# Patient Record
Sex: Female | Born: 1935 | Race: White | Hispanic: No | Smoking: Never smoker
Health system: Southern US, Community
[De-identification: ages and names within clinical notes are randomized; demographics above are authoritative.]

## PROBLEM LIST (undated history)

## (undated) DIAGNOSIS — I10 Essential (primary) hypertension: Secondary | ICD-10-CM

## (undated) DIAGNOSIS — E119 Type 2 diabetes mellitus without complications: Secondary | ICD-10-CM

## (undated) DIAGNOSIS — I4891 Unspecified atrial fibrillation: Secondary | ICD-10-CM

## (undated) HISTORY — PX: ABDOMINAL HYSTERECTOMY: SHX81

---

## 2004-07-14 ENCOUNTER — Other Ambulatory Visit: Payer: Self-pay

## 2004-07-14 ENCOUNTER — Emergency Department: Payer: Self-pay | Admitting: Emergency Medicine

## 2005-05-02 ENCOUNTER — Emergency Department: Payer: Self-pay | Admitting: Emergency Medicine

## 2005-11-25 ENCOUNTER — Ambulatory Visit: Payer: Self-pay | Admitting: Gastroenterology

## 2007-01-14 ENCOUNTER — Ambulatory Visit: Payer: Self-pay | Admitting: Internal Medicine

## 2007-02-21 ENCOUNTER — Ambulatory Visit: Payer: Self-pay | Admitting: Internal Medicine

## 2007-04-25 ENCOUNTER — Ambulatory Visit: Payer: Self-pay | Admitting: Internal Medicine

## 2007-12-04 ENCOUNTER — Emergency Department: Payer: Self-pay | Admitting: Emergency Medicine

## 2008-01-18 ENCOUNTER — Ambulatory Visit: Payer: Self-pay | Admitting: Anesthesiology

## 2008-04-02 ENCOUNTER — Ambulatory Visit: Payer: Self-pay | Admitting: Anesthesiology

## 2008-04-24 ENCOUNTER — Ambulatory Visit: Payer: Self-pay | Admitting: Anesthesiology

## 2008-07-18 ENCOUNTER — Ambulatory Visit: Payer: Self-pay | Admitting: Anesthesiology

## 2009-03-11 ENCOUNTER — Encounter: Payer: Self-pay | Admitting: Internal Medicine

## 2009-03-24 ENCOUNTER — Encounter: Payer: Self-pay | Admitting: Internal Medicine

## 2010-06-17 ENCOUNTER — Ambulatory Visit: Payer: Self-pay | Admitting: Internal Medicine

## 2010-06-24 ENCOUNTER — Ambulatory Visit: Payer: Self-pay | Admitting: Internal Medicine

## 2010-08-13 ENCOUNTER — Ambulatory Visit: Payer: Self-pay | Admitting: Internal Medicine

## 2010-08-23 ENCOUNTER — Ambulatory Visit: Payer: Self-pay | Admitting: Internal Medicine

## 2010-09-22 ENCOUNTER — Ambulatory Visit: Payer: Self-pay | Admitting: Internal Medicine

## 2011-06-07 ENCOUNTER — Inpatient Hospital Stay: Payer: Self-pay | Admitting: Internal Medicine

## 2011-06-07 LAB — CK TOTAL AND CKMB (NOT AT ARMC): CK, Total: 55 U/L (ref 21–215)

## 2011-06-07 LAB — CBC
MCH: 29.3 pg (ref 26.0–34.0)
MCHC: 33.1 g/dL (ref 32.0–36.0)
MCV: 89 fL (ref 80–100)
Platelet: 208 10*3/uL (ref 150–440)
RDW: 13.9 % (ref 11.5–14.5)
WBC: 16.1 10*3/uL — ABNORMAL HIGH (ref 3.6–11.0)

## 2011-06-07 LAB — COMPREHENSIVE METABOLIC PANEL
Alkaline Phosphatase: 69 U/L (ref 50–136)
Anion Gap: 12 (ref 7–16)
Bilirubin,Total: 0.5 mg/dL (ref 0.2–1.0)
Calcium, Total: 8.3 mg/dL — ABNORMAL LOW (ref 8.5–10.1)
Chloride: 103 mmol/L (ref 98–107)
Co2: 24 mmol/L (ref 21–32)
EGFR (African American): >60
EGFR (Non-African Amer.): >60
Potassium: 3.9 mmol/L (ref 3.5–5.1)
SGOT(AST): 13 U/L — ABNORMAL LOW (ref 15–37)
SGPT (ALT): 16 U/L

## 2011-06-07 LAB — TROPONIN I: Troponin-I: <0.02 ng/mL

## 2011-06-08 LAB — CBC WITH DIFFERENTIAL/PLATELET
Basophil #: 0 10*3/uL (ref 0.0–0.1)
Basophil %: 0 %
Eosinophil #: 0 10*3/uL (ref 0.0–0.7)
HCT: 35.8 % (ref 35.0–47.0)
HGB: 11.8 g/dL — ABNORMAL LOW (ref 12.0–16.0)
Lymphocyte %: 5.4 %
MCH: 29.2 pg (ref 26.0–34.0)
MCHC: 32.8 g/dL (ref 32.0–36.0)
MCV: 89 fL (ref 80–100)
Monocyte #: 0.5 10*3/uL (ref 0.0–0.7)
Neutrophil #: 16.7 10*3/uL — ABNORMAL HIGH (ref 1.4–6.5)
Neutrophil %: 92 %
RBC: 4.03 10*6/uL (ref 3.80–5.20)

## 2011-06-08 LAB — BASIC METABOLIC PANEL
Anion Gap: 10 (ref 7–16)
Chloride: 103 mmol/L (ref 98–107)
Co2: 25 mmol/L (ref 21–32)
Creatinine: 1.04 mg/dL (ref 0.60–1.30)
EGFR (African American): >60
Osmolality: 290 (ref 275–301)

## 2011-06-08 LAB — URINALYSIS, COMPLETE
Bacteria: NONE SEEN
Glucose,UR: >=500 mg/dL (ref 0–75)
Ph: 5 (ref 4.5–8.0)
Protein: 30
RBC,UR: 3 /HPF (ref 0–5)
Specific Gravity: 1.017 (ref 1.003–1.030)
Squamous Epithelial: NONE SEEN

## 2011-06-08 LAB — TROPONIN I: Troponin-I: <0.02 ng/mL

## 2011-06-08 LAB — CK TOTAL AND CKMB (NOT AT ARMC): CK, Total: 87 U/L (ref 21–215)

## 2011-06-09 LAB — CBC WITH DIFFERENTIAL/PLATELET
Basophil #: 0 10*3/uL (ref 0.0–0.1)
Basophil %: 0 %
Eosinophil %: 0 %
HCT: 33.5 % — ABNORMAL LOW (ref 35.0–47.0)
HGB: 11 g/dL — ABNORMAL LOW (ref 12.0–16.0)
Lymphocyte #: 1 10*3/uL (ref 1.0–3.6)
Lymphocyte %: 7.9 %
MCHC: 32.9 g/dL (ref 32.0–36.0)
Monocyte #: 0.3 10*3/uL (ref 0.0–0.7)
Monocyte %: 2.7 %
Neutrophil #: 11.6 10*3/uL — ABNORMAL HIGH (ref 1.4–6.5)
Neutrophil %: 89.4 %
Platelet: 228 10*3/uL (ref 150–440)
RBC: 3.78 10*6/uL — ABNORMAL LOW (ref 3.80–5.20)
WBC: 13 10*3/uL — ABNORMAL HIGH (ref 3.6–11.0)

## 2011-06-09 LAB — BASIC METABOLIC PANEL
Calcium, Total: 8.1 mg/dL — ABNORMAL LOW (ref 8.5–10.1)
Chloride: 104 mmol/L (ref 98–107)
Co2: 24 mmol/L (ref 21–32)
Creatinine: 1.32 mg/dL — ABNORMAL HIGH (ref 0.60–1.30)
EGFR (African American): 51 — ABNORMAL LOW
Potassium: 4 mmol/L (ref 3.5–5.1)
Sodium: 141 mmol/L (ref 136–145)

## 2011-06-10 LAB — BASIC METABOLIC PANEL
Anion Gap: 10 (ref 7–16)
BUN: 47 mg/dL — ABNORMAL HIGH (ref 7–18)
Calcium, Total: 8.8 mg/dL (ref 8.5–10.1)
Co2: 25 mmol/L (ref 21–32)
Creatinine: 1.44 mg/dL — ABNORMAL HIGH (ref 0.60–1.30)
EGFR (African American): 46 — ABNORMAL LOW
Glucose: 281 mg/dL — ABNORMAL HIGH (ref 65–99)
Potassium: 4 mmol/L (ref 3.5–5.1)

## 2011-06-10 LAB — CBC WITH DIFFERENTIAL/PLATELET
Eosinophil #: 0 10*3/uL (ref 0.0–0.7)
Eosinophil %: 0 %
HGB: 12 g/dL (ref 12.0–16.0)
Lymphocyte %: 11.9 %
MCH: 29.3 pg (ref 26.0–34.0)
MCHC: 32.7 g/dL (ref 32.0–36.0)
Monocyte %: 3.1 %
Neutrophil %: 84.9 %
Platelet: 265 10*3/uL (ref 150–440)
RBC: 4.08 10*6/uL (ref 3.80–5.20)

## 2011-06-13 LAB — CULTURE, BLOOD (SINGLE)

## 2011-09-09 ENCOUNTER — Ambulatory Visit: Payer: Self-pay | Admitting: Internal Medicine

## 2012-07-13 ENCOUNTER — Ambulatory Visit: Payer: Self-pay | Admitting: Internal Medicine

## 2013-07-03 LAB — CBC
HCT: 40.2 % (ref 35.0–47.0)
HGB: 13.4 g/dL (ref 12.0–16.0)
MCH: 30.2 pg (ref 26.0–34.0)
MCHC: 33.3 g/dL (ref 32.0–36.0)
MCV: 91 fL (ref 80–100)
Platelet: 210 10*3/uL (ref 150–440)
RBC: 4.43 10*6/uL (ref 3.80–5.20)
RDW: 13.6 % (ref 11.5–14.5)
WBC: 7.5 10*3/uL (ref 3.6–11.0)

## 2013-07-03 LAB — COMPREHENSIVE METABOLIC PANEL
Albumin: 2.4 g/dL — ABNORMAL LOW (ref 3.4–5.0)
Alkaline Phosphatase: 84 U/L
Anion Gap: 11 (ref 7–16)
BUN: 26 mg/dL — ABNORMAL HIGH (ref 7–18)
Bilirubin,Total: 0.4 mg/dL (ref 0.2–1.0)
CREATININE: 1.28 mg/dL (ref 0.60–1.30)
Calcium, Total: 8.7 mg/dL (ref 8.5–10.1)
Chloride: 100 mmol/L (ref 98–107)
Co2: 22 mmol/L (ref 21–32)
GFR CALC AF AMER: 47 — AB
GFR CALC NON AF AMER: 41 — AB
GLUCOSE: 463 mg/dL — AB (ref 65–99)
Osmolality: 291 (ref 275–301)
POTASSIUM: 4 mmol/L (ref 3.5–5.1)
SGOT(AST): 8 U/L — ABNORMAL LOW (ref 15–37)
SGPT (ALT): 15 U/L (ref 12–78)
Sodium: 133 mmol/L — ABNORMAL LOW (ref 136–145)
Total Protein: 7.8 g/dL (ref 6.4–8.2)

## 2013-07-03 LAB — URINALYSIS, COMPLETE
Bilirubin,UR: NEGATIVE
Glucose,UR: >=500 mg/dL (ref 0–75)
Nitrite: NEGATIVE
Ph: 5 (ref 4.5–8.0)
Protein: NEGATIVE
SPECIFIC GRAVITY: 1.016 (ref 1.003–1.030)
Squamous Epithelial: 1

## 2013-07-03 LAB — LIPASE, BLOOD: Lipase: 155 U/L (ref 73–393)

## 2013-07-03 LAB — TROPONIN I: Troponin-I: <0.02 ng/mL

## 2013-07-04 ENCOUNTER — Inpatient Hospital Stay: Payer: Self-pay | Admitting: Internal Medicine

## 2013-07-04 LAB — CK TOTAL AND CKMB (NOT AT ARMC)
CK, TOTAL: 13 U/L — AB
CK, Total: 13 U/L — ABNORMAL LOW
CK-MB: <0.5 ng/mL — ABNORMAL LOW (ref 0.5–3.6)

## 2013-07-04 LAB — TROPONIN I: Troponin-I: <0.02 ng/mL

## 2013-07-05 LAB — BASIC METABOLIC PANEL
Anion Gap: 7 (ref 7–16)
BUN: 15 mg/dL (ref 7–18)
CALCIUM: 8.2 mg/dL — AB (ref 8.5–10.1)
Chloride: 110 mmol/L — ABNORMAL HIGH (ref 98–107)
Co2: 23 mmol/L (ref 21–32)
Creatinine: 1.08 mg/dL (ref 0.60–1.30)
EGFR (African American): 58 — ABNORMAL LOW
EGFR (Non-African Amer.): 50 — ABNORMAL LOW
GLUCOSE: 221 mg/dL — AB (ref 65–99)
OSMOLALITY: 287 (ref 275–301)
POTASSIUM: 4.1 mmol/L (ref 3.5–5.1)
SODIUM: 140 mmol/L (ref 136–145)

## 2013-07-05 LAB — LIPID PANEL
Cholesterol: 96 mg/dL (ref 0–200)
HDL Cholesterol: 21 mg/dL — ABNORMAL LOW (ref 40–60)
Ldl Cholesterol, Calc: 42 mg/dL (ref 0–100)
TRIGLYCERIDES: 165 mg/dL (ref 0–200)
VLDL Cholesterol, Calc: 33 mg/dL (ref 5–40)

## 2013-07-05 LAB — CBC WITH DIFFERENTIAL/PLATELET
BASOS PCT: 0.2 %
Basophil #: 0 10*3/uL (ref 0.0–0.1)
EOS PCT: 0.3 %
Eosinophil #: 0 10*3/uL (ref 0.0–0.7)
HCT: 36.4 % (ref 35.0–47.0)
HGB: 12.3 g/dL (ref 12.0–16.0)
Lymphocyte #: 1.4 10*3/uL (ref 1.0–3.6)
Lymphocyte %: 18.4 %
MCH: 30.8 pg (ref 26.0–34.0)
MCHC: 33.8 g/dL (ref 32.0–36.0)
MCV: 91 fL (ref 80–100)
Monocyte #: 0.7 x10 3/mm (ref 0.2–0.9)
Monocyte %: 8.9 %
Neutrophil #: 5.5 10*3/uL (ref 1.4–6.5)
Neutrophil %: 72.2 %
Platelet: 217 10*3/uL (ref 150–440)
RBC: 4 10*6/uL (ref 3.80–5.20)
RDW: 14.1 % (ref 11.5–14.5)
WBC: 7.6 10*3/uL (ref 3.6–11.0)

## 2013-07-05 LAB — TSH: THYROID STIMULATING HORM: 0.798 u[IU]/mL

## 2013-07-05 LAB — HEMOGLOBIN A1C: Hemoglobin A1C: 12.8 % — ABNORMAL HIGH (ref 4.2–6.3)

## 2013-07-05 LAB — CK TOTAL AND CKMB (NOT AT ARMC)
CK, TOTAL: 10 U/L — AB
CK-MB: <0.5 ng/mL — ABNORMAL LOW (ref 0.5–3.6)

## 2013-07-05 LAB — MAGNESIUM: MAGNESIUM: 1.7 mg/dL — AB

## 2013-07-06 ENCOUNTER — Ambulatory Visit: Payer: Self-pay | Admitting: Internal Medicine

## 2013-07-06 LAB — BASIC METABOLIC PANEL
ANION GAP: 5 — AB (ref 7–16)
BUN: 14 mg/dL (ref 7–18)
CALCIUM: 8.3 mg/dL — AB (ref 8.5–10.1)
Chloride: 113 mmol/L — ABNORMAL HIGH (ref 98–107)
Co2: 20 mmol/L — ABNORMAL LOW (ref 21–32)
Creatinine: 1.05 mg/dL (ref 0.60–1.30)
EGFR (African American): >60
EGFR (Non-African Amer.): 52 — ABNORMAL LOW
GLUCOSE: 167 mg/dL — AB (ref 65–99)
Osmolality: 280 (ref 275–301)
Potassium: 4 mmol/L (ref 3.5–5.1)
Sodium: 138 mmol/L (ref 136–145)

## 2013-07-06 LAB — CBC WITH DIFFERENTIAL/PLATELET
Basophil #: 0 10*3/uL (ref 0.0–0.1)
Basophil %: 0.4 %
Eosinophil #: 0 10*3/uL (ref 0.0–0.7)
Eosinophil %: 0.8 %
HCT: 33.5 % — AB (ref 35.0–47.0)
HGB: 11.3 g/dL — AB (ref 12.0–16.0)
LYMPHS ABS: 1.3 10*3/uL (ref 1.0–3.6)
Lymphocyte %: 22 %
MCH: 30.3 pg (ref 26.0–34.0)
MCHC: 33.8 g/dL (ref 32.0–36.0)
MCV: 90 fL (ref 80–100)
Monocyte #: 0.5 x10 3/mm (ref 0.2–0.9)
Monocyte %: 9.1 %
Neutrophil #: 4.1 10*3/uL (ref 1.4–6.5)
Neutrophil %: 67.7 %
Platelet: 232 10*3/uL (ref 150–440)
RBC: 3.74 10*6/uL — AB (ref 3.80–5.20)
RDW: 14.3 % (ref 11.5–14.5)
WBC: 6 10*3/uL (ref 3.6–11.0)

## 2013-07-07 LAB — BASIC METABOLIC PANEL
ANION GAP: 8 (ref 7–16)
BUN: 13 mg/dL (ref 7–18)
CREATININE: 1.09 mg/dL (ref 0.60–1.30)
Calcium, Total: 8 mg/dL — ABNORMAL LOW (ref 8.5–10.1)
Chloride: 112 mmol/L — ABNORMAL HIGH (ref 98–107)
Co2: 21 mmol/L (ref 21–32)
GFR CALC AF AMER: 58 — AB
GFR CALC NON AF AMER: 50 — AB
Glucose: 137 mg/dL — ABNORMAL HIGH (ref 65–99)
Osmolality: 284 (ref 275–301)
POTASSIUM: 3.8 mmol/L (ref 3.5–5.1)
Sodium: 141 mmol/L (ref 136–145)

## 2013-07-07 LAB — CBC WITH DIFFERENTIAL/PLATELET
Basophil #: 0 10*3/uL (ref 0.0–0.1)
Basophil %: 0.3 %
EOS ABS: 0.1 10*3/uL (ref 0.0–0.7)
EOS PCT: 0.9 %
HCT: 33 % — AB (ref 35.0–47.0)
HGB: 10.9 g/dL — AB (ref 12.0–16.0)
Lymphocyte #: 1.6 10*3/uL (ref 1.0–3.6)
Lymphocyte %: 29 %
MCH: 29.7 pg (ref 26.0–34.0)
MCHC: 33 g/dL (ref 32.0–36.0)
MCV: 90 fL (ref 80–100)
Monocyte #: 0.5 x10 3/mm (ref 0.2–0.9)
Monocyte %: 8.3 %
NEUTROS ABS: 3.5 10*3/uL (ref 1.4–6.5)
NEUTROS PCT: 61.5 %
Platelet: 269 10*3/uL (ref 150–440)
RBC: 3.66 10*6/uL — ABNORMAL LOW (ref 3.80–5.20)
RDW: 14.1 % (ref 11.5–14.5)
WBC: 5.7 10*3/uL (ref 3.6–11.0)

## 2013-07-07 LAB — URINE CULTURE

## 2013-07-10 LAB — BASIC METABOLIC PANEL
Anion Gap: 6 — ABNORMAL LOW (ref 7–16)
BUN: 12 mg/dL (ref 7–18)
CO2: 23 mmol/L (ref 21–32)
CREATININE: 1.18 mg/dL (ref 0.60–1.30)
Calcium, Total: 8.6 mg/dL (ref 8.5–10.1)
Chloride: 113 mmol/L — ABNORMAL HIGH (ref 98–107)
EGFR (African American): 52 — ABNORMAL LOW
GFR CALC NON AF AMER: 45 — AB
Glucose: 149 mg/dL — ABNORMAL HIGH (ref 65–99)
Osmolality: 286 (ref 275–301)
Potassium: 4.5 mmol/L (ref 3.5–5.1)
Sodium: 142 mmol/L (ref 136–145)

## 2013-07-10 LAB — CBC WITH DIFFERENTIAL/PLATELET
BASOS PCT: 0.5 %
Basophil #: 0 10*3/uL (ref 0.0–0.1)
EOS PCT: 1.5 %
Eosinophil #: 0.1 10*3/uL (ref 0.0–0.7)
HCT: 34.1 % — ABNORMAL LOW (ref 35.0–47.0)
HGB: 11.4 g/dL — AB (ref 12.0–16.0)
LYMPHS PCT: 30 %
Lymphocyte #: 1.7 10*3/uL (ref 1.0–3.6)
MCH: 29.9 pg (ref 26.0–34.0)
MCHC: 33.4 g/dL (ref 32.0–36.0)
MCV: 90 fL (ref 80–100)
MONO ABS: 0.4 x10 3/mm (ref 0.2–0.9)
Monocyte %: 7.8 %
Neutrophil #: 3.4 10*3/uL (ref 1.4–6.5)
Neutrophil %: 60.2 %
Platelet: 301 10*3/uL (ref 150–440)
RBC: 3.81 10*6/uL (ref 3.80–5.20)
RDW: 13.8 % (ref 11.5–14.5)
WBC: 5.7 10*3/uL (ref 3.6–11.0)

## 2013-07-12 ENCOUNTER — Ambulatory Visit: Payer: Self-pay | Admitting: Internal Medicine

## 2013-07-12 LAB — APTT: ACTIVATED PTT: 29 s (ref 23.6–35.9)

## 2013-07-12 LAB — PROTIME-INR
INR: 1
Prothrombin Time: 13.4 secs (ref 11.5–14.7)

## 2013-07-18 LAB — PATHOLOGY REPORT

## 2014-09-14 NOTE — Consult Note (Signed)
   Present Illness 79 yo female withi history of copd admitted with weakness and fatigue and note to have a uti. Was also noted to have afib with rvr. Pt has not had cardiac problems in the past and denies ever having irregular heart rate. She was hemodynamically stable with this She was given iv diltiazem with improvment in her rate. She is stable at present. Sh denies chest pain. She has ruled out for an mi thus far.   Physical Exam:  GEN no acute distress   HEENT PERRL, hearing intact to voice   NECK supple   RESP normal resp effort  clear BS   CARD Irregular rate and rhythm  No murmur   ABD denies tenderness  no liver/spleen enlargement  no Abdominal Bruits   LYMPH negative neck   EXTR negative cyanosis/clubbing, negative edema   SKIN normal to palpation   NEURO cranial nerves intact, motor/sensory function intact   PSYCH A+O to time, place, person   Review of Systems:  Subjective/Chief Complaint weakness and fatigue   General: Fatigue  Fever/chills  Weakness   Skin: No Complaints   ENT: No Complaints   Eyes: No Complaints   Neck: No Complaints   Respiratory: No Complaints   Cardiovascular: Palpitations   Gastrointestinal: No Complaints   Genitourinary: No Complaints   Vascular: No Complaints   Musculoskeletal: No Complaints   Neurologic: No Complaints   Hematologic: No Complaints   Endocrine: No Complaints   Psychiatric: No Complaints   Review of Systems: All other systems were reviewed and found to be negative   Medications/Allergies Reviewed Medications/Allergies reviewed   EKG:  Interpretation afib with variable ventricular response    Reglan: Swelling, Rash  Demerol: GI Distress  Darvon: GI Distress  Penicillin: Rash   Impression 79 yo female with copd admitted with uti and noted ot have afib wiht rvr. Has diabetes. No prior cardiac disease. Ruled out for mi thus far. Rate controlled wiht iv dilt. Will wean off of iv dilt and place on  cardizem 30 mg q 6. Treat uti. Echo.CHADSS score is 2-3.   Plan 1. Wean off of iv dilt. 2 Cardizem 30 q 6 3 Reveiw echo when available 4. Will add xarelto when ruled out 5. WIll follow   Electronic Signatures: Dalia HeadingFath, Reeder Brisby A (MD)  (Signed 11-Feb-15 11:08)  Authored: General Aspect/Present Illness, History and Physical Exam, Review of System, EKG , Allergies, Impression/Plan   Last Updated: 11-Feb-15 11:08 by Dalia HeadingFath, Gionni Vaca A (MD)

## 2014-09-14 NOTE — Consult Note (Signed)
PATIENT NAMEDRUSILLA, [name] MR#:  [id number] DATE OF BIRTH:  11/18/1935  ONCOLOGY CONSULTATION   DATE OF CONSULTATION:  07/06/2013  CONSULTING PHYSICIAN:  Knute Neu. Lorre Nick, MD  HISTORY OF PRESENT ILLNESS: Ms. [name] is a 79 year old patient who was admitted on February 10th with a chief complaint of left lower quadrant abdominal pain, nausea and dizziness. The patient was found in rapid atrial fibrillation. She was treated with Cardizem bolus and drip. She was given Rocephin with urine infection and given intravenous fluids. Was given insulin for hyperglycemia. Appeared to have pyelonephritis and had possibly malnutrition. Was hospitalized for intravenous fluids, treatment of chronic obstructive pulmonary disease, antibiotics, rate and rhythm control, cardiology consultation. An x-ray showed a lung mass, and a noncontrast CT showed a peripheral right lower lobe lung mass as well.   PAST MEDICAL HISTORY: Obstructive sleep apnea, uses oxygen at night. COPD, in the past, used oxygen during the daytime, none recently, does report some shortness of breath on exertion. Has had some chronic low back pain, degenerative joint disease, hypertension, type 2 diabetes.   PAST SURGICAL HISTORY: Cataracts and hysterectomy.   ALLERGIES: DARVON, DEMEROL, PENICILLIN AND REGLAN.   SOCIAL HISTORY: No alcohol. No smoking, did smoke 30 or 40 years ago briefly.   FAMILY HISTORY: The patient reported some family members with malignancy, but did not have many details. A brother had prostate cancer. A family member had lung cancer. A niece and a nephew had cancers of unknown type.   MEDICATIONS AT HOME: Included:  1. Metformin 1000 mg twice a day. 2. Lisinopril 20 mg a day.  3. Hydrochlorothiazide 25 daily.  4. Glipizide 10 twice a day. 5. Celebrex 200 mg daily.  6. Albuterol 2 puffs p.r.n., not taken recently.   SYSTEM REVIEW:  CONSTITUTIONAL: When I saw the patient, she did not have any fever, chills, sweats.  Had some general weakness.  HEENT: No visual disturbances. No ear or jaw pain. No epistaxis.  RESPIRATORY: No active cough. Has a history of COPD.  CARDIOVASCULAR: Was not having chest pain or palpitation. Was not dizzy. That was a complaint on admission.  GASTROINTESTINAL: Was not nauseous or vomiting, but had been nauseated on admission. Was not currently having abdominal pain, which was an admission complaint.  GENITOURINARY: No dysuria or hematuria.  HEMATOLOGIC: No easy bleeding or bruising.  SKIN: No rash or pruritus.  NEUROLOGIC: No focal weakness.  PSYCHIATRIC: Denied any depression or anxiety.   PHYSICAL EXAMINATION:  GENERAL: She is alert and cooperative.  VITAL SIGNS: Pulse was irregular.  HEENT: The sclerae had no jaundice. Mouth: No thrush.  LYMPH NODES: Not palpably enlarged in the neck, supraclavicular, submandibular or axillae.  LUNGS: Clear. There is somewhat decreased air entry in all lung fields.  HEART: Irregular.  ABDOMEN: Obese, but nontender. No palpable mass or organomegaly.  NEUROLOGIC: Grossly nonfocal. Moving all extremities against gravity. I did not test the gait.  EXTREMITIES: No edema.  PSYCHIATRIC: Mood and affect were unremarkable.   ADDITIONAL HISTORY: The patient states that she has had yearly mammograms and thinks she might be slightly overdue this year. Also had a history of a colonoscopy for polyps, last approximately 4 years ago.   LABORATORY DATA: On admission, was in atrial fibrillation with rapid rate. On admission, the creatinine was 1.28, calcium was normal. Troponin was low. Urine: Had abnormal UA with clumps and positive esterase. CBC was unremarkable. Albumin was 2.4; the rest of the liver functions were unremarkable.   IMPRESSION AND  PLAN: The patient with a number of comorbidities, diabetes is out of control, infection with pyelonephritis and new-onset atrial fibrillation with rapid response. Has been evaluated by cardiology. On Cardizem for  rate control. Abdominal pain was thought to be cystitis or pyelonephritis. Has chronic obstructive pulmonary disease and sleep apnea, currently no exacerbation. Had a low albumin level consistent with possibly malnutrition.   From the oncology point of view, has a history that she may be slightly overdue for mammogram, and that she may be slightly overdue for followup screening colonoscopy. Otherwise, the patient was found to have a lung mass on a noncontrast CT. Creatinine, calcium and baseline liver functions were unremarkable except for a low albumin. There were no neurologic symptoms. I have discussed with Dr. Belia HemanKasa in pulmonary medicine. One potential approach would be CT-guided biopsy. However, I favor holding on biopsy, getting a PET CT scan and reviewing the films with thoracic surgery. It looks like the patient would not be a surgical candidate, even if she had a stage I cancer, due to comorbidities, but this should be re-evaluated. After a PET CT, if there is no other site of metastatic disease or no more accessible or safer avenue to biopsy, could later in the week go on with a CT-guided biopsy of the lung. Would recheck regarding the other issues about GI and mammogram screening. Also, the patient did report Celebrex, so she would have had a dose on February 10th. Has been given 1 dose of Lovenox now. Of course, that could be held, and would avoid nonsteroidals in anticipation of invasive procedure. I have explained these issues to the patient as well, who is in agreement.   ____________________________ Knute Neuobert G. Lorre NickGittin, MD rgg:lb D: 07/07/2013 12:34:00 ET T: 07/07/2013 13:13:13 ET JOB#: [id number]  cc: Knute Neuobert G. Lorre NickGittin, MD, <Dictator> Marin RobertsOBERT G Ilanna Deihl MD ELECTRONICALLY SIGNED 08/07/2013 15:04

## 2014-09-14 NOTE — H&P (Signed)
PATIENT NAMEREGINE, [name] MR#:  [id number] DATE OF BIRTH:  28-Oct-1935  DATE OF ADMISSION:  07/03/2013  PRIMARY CARE PHYSICIAN:  Dr. Barbette Reichmann.  REFERRING ER PHYSICIAN:  Dr. Margarita Grizzle.   CHIEF COMPLAINT:  Left lower quadrant abdominal pain, nausea, dizziness.  HISTORY OF PRESENT ILLNESS: The patient is a 79 year old pleasant Caucasian female with a past medical history of diabetes mellitus, hypertension, obstructive sleep apnea, chronic obstructive pulmonary disease and chronic low back pain, is presenting to the ER with a chief complaint of left lower quadrant abdominal pain associated with nausea and dizziness. The patient is reporting that the abdominal pain in the left lower quadrant has started at 5:00 p.m. yesterday night; associated with nausea, but not throwing up, has been feeling dizzy pain. Denies any chest pain or shortness of breath. The patient is diagnosed with atrial fibrillation with RVR at 163 beats per minute. She denies any chest pain or shortness of breath. No similar complaints in the past. The patient was given Cardizem IV bolus with no significant improvement. Subsequently, she was started on Cardizem drip. Urine is positive for urinary tract infection and she was given IV Rocephin in the ER. The patient is also hyperglycemic with an Accu-Chek of 428 and serum blood sugar at 453. The patient was given 12 units of IV regular insulin and one liter of fluid bolus was given. The patient is reporting that she is under a lot of stress, as had brother pass away approximately two months ago. She also has reported that her own daughter who moved to live with her, give her really tough time and patient was stressed out.  Now, she has been living with her son and feeling better. Because of the stress and bereavement reaction, she stopped taking her medications for the past 4 or 5 months and has not been seen by her primary care physician in the past five months. Denies any loss of  consciousness. No other complaints. Denies any diarrhea or constipation.   PAST MEDICAL HISTORY: Obstructive sleep apnea, COPD, chronic low back pain,  obstructive sleep apnea, degenerative joint disease, obesity, hypertension, type 2 diabetes mellitus.   PAST SURGICAL HISTORY: Bilateral cataract surgery and hysterectomy.   ALLERGIES:  1.  DARVON. 2.  DEMEROL. 3.  PENICILLIN GIVES HER A RASH.  4.  REGLAN.   PSYCHOSOCIAL HISTORY: Lives at home with son. No history of smoking, alcohol or illicit drug usage.   FAMILY HISTORY: The patient's mother had diabetes mellitus and dementia, chronic renal insufficiency. Dad has CVA and diabetes.   HOME MEDICATIONS:  1.  Metformin 1000 mg 2 times a day. 2.  Lisinopril 20 mg once daily. 3.  Hydrochlorothiazide 25 mg once daily. 4.  Glipizide 10 mg tab 2 times a day. 5.  Celebrex 200 mg once daily. 6.  Albuterol 2 puffs inhalation as needed basis for shortness of breath, but the patient has been not taking her medications in the past 4 to 5 months.   REVIEW OF SYSTEMS: CONSTITUTIONAL: Denies any fever, fatigue.  EYES: Denies blurry vision, double vision.  ENT: Denies epistaxis, discharge.  RESPIRATORY: Denies cough, has history of COPD.   CARDIOVASCULAR: No chest pain, palpitations. Complaining of dizziness.  GASTROINTESTINAL: Complaining of nausea. Denies vomiting, diarrhea. Has left lower quadrant abdominal pain. No hematemesis.  GENITOURINARY: Complaining of dysuria, no hematuria.   GYNECOLOGIC AND BREASTS: Denies breast mass or vaginal discharge.  ENDOCRINE: Denies polyuria, nocturia. Has history of diabetes mellitus. HEMATOLOGIC AND LYMPHATIC: No anemia,  easy bruising, bleeding.  INTEGUMENTARY: No rashes. No acne. No lesions.  MUSCULOSKELETAL: No joint pain in the neck and back. Denies gout.  NEUROLOGIC: No vertigo or ataxia.  PSYCHIATRIC: No ADD, OCD.   PHYSICAL EXAMINATION: VITAL SIGNS: Temperature 97.5, pulse 120, respirations 20,  blood pressure 150/85, pulse oximetry is 97%. GENERAL: Not in acute distress. Moderately built, obese  pleasant Caucasian female.  HEENT: Normocephalic, atraumatic. Pupils are equally reacting to light and accommodation. No scleral icterus. No conjunctival injection. No sinus tenderness. Moist mucous membranes.  NECK: Supple. No JVD. No thyromegaly.  LUNGS: Clear to auscultation bilaterally. No accessory muscle use and no anterior chest wall tenderness on palpation.  CARDIOVASCULAR: Irregularly irregular. No murmurs.  GASTROINTESTINAL: Soft, obese. Bowel sounds are positive in all four quadrants. Positive left lower quadrant tenderness. No rebound tenderness. No masses felt.  NEUROLOGIC: Awake, alert, oriented x 3. Cranial nerves II through XII are intact. Motor and sensory are intact. Reflexes are 2+.  EXTREMITIES: No edema. No cyanosis. No clubbing.  SKIN: Warm to touch. Normal turgor. No rashes. No lesions.  MUSCULOSKELETAL: No joint effusion, tenderness, erythema.  PSYCHIATRIC: Normal mood and affect.   LABORATORY AND IMAGING STUDIES:  A 12-lead EKG has revealed atrial fibrillation with RVR at 153 beats per minute, QRS duration is 80.Accu-Chek 428; repeat one is at 374. Glucose 453, BUN 26, creatinine 1.28, sodium 130, potassium 4.0, chloride 100, CO2 22. GFR 41, anion gap 11, normal calcium and serum osmolality and lipase. Troponin less than 0.02. CBC is normal. Urinalysis yellow in color, hazy in appearance, glucose greater than 500, nitrite is negative, leukocyte esterase is 2+. White blood cell clumps are present. CBC is normal. LFTs: Albumin at 2.4, bilirubin total is 0.4, AST is 8, alkaline phosphatase is 84, ALT 15.   ASSESSMENT AND PLAN: A 79 year old Caucasian female came into the Emergency Room with chief complaint of left lower quadrant abdominal pain associated with nausea and dizziness. Will be admitted with following assessment and plan.  1.  New onset atrial fibrillation with rapid  ventricular response. Will admit her to Critical Care Unit stepdown, Cardizem GTT to titrate heart rate to 100 to 110. Lovenox 1 mg/kg subcutaneous x 1. [facility]iology [facility]  [facility]. Will cycle cardiac biomarkers. The patient is started on Cardizem CR 120 mg p.o. once daily.  2.  Acute left lower quadrant abdominal pain, probably from acute cystitis, possible acute pyelonephritis, as she has flank pain on the left side. Urine cultures were ordered. The patient will be on IV Rocephin.  3.  Diabetes mellitus with hyperglycemia. This is from noncompliance with her medications. The patient is also under a lot of stress and under bereavement  reaction. Will restart her on metformin and glipizide. The patient will be also on sliding scale insulin.  4.  History of hypertension.  The patient is started on p.o. Cardizem. Will monitor her blood pressure closely. The patient is on Cardizem. Will hold off on ACE inhibitor, Lisinopril which is her home medication.  5.  Chronic obstructive pulmonary disease and obstructive sleep apnea. Currently, she is not under exacerbation. Will provide her breathing treatments as-needed basis.  6.   Probably moderate malnutrition. We will check the albumin level.    7.  Will provide her gastrointestinal prophylaxis and deep vein thrombosis prophylaxis.  8.  CODE STATUS: She is full code. Son is the medical power of attorney.   Diagnosis and plan of care was discussed in detail with the  patient. She is aware of the plan.   Total critical care time spent: 50 minutes.    ____________________________ Ramonita Lab, MD ag:NTS D: 07/04/2013 00:52:29 ET T: 07/04/2013 01:30:59 ET JOB#: [id number]  cc: Ramonita Lab, MD, <Dictator> Ramonita Lab MD ELECTRONICALLY SIGNED 07/19/2013 18:42

## 2014-09-14 NOTE — Discharge Summary (Signed)
PATIENT NAMEMARTIKA, [name] MR#:  [id number] DATE OF BIRTH:  1935/10/05  DATE OF ADMISSION:  07/04/2013 DATE OF DISCHARGE:  07/10/2013  DISCHARGE DIAGNOSES:  1.  New onset atrial fibrillation with rapid ventricular response.  2.  Urinary tract infection.  3.  Right lower lobe lung mass.  4.  Obstructive sleep apnea.  5.  Type 2 diabetes.  6.  Obesity.  7.  Hypertension.   CHIEF COMPLAINT: Nausea, dizziness, lower abdominal pain.   HISTORY OF PRESENT ILLNESS: [name] is a 79 year old female with a history of type 2 diabetes, hypertension, obstructive sleep apnea, COPD, and chronic low back pain who presented to the ER complaining of left lower quadrant abdominal pain associated with nausea and dizziness. The patient was also noted to be in A-fib with RVR with a heart rate of 163 beats per minute. The patient denies any chest pain or shortness of breath and had no previous symptoms. Urinalysis showed evidence for urinary tract infection. The patient was also noted to have elevated blood sugar of 453 and was given 12 units Regular insulin IV and 1 liter of fluids. She was admitted initially to the CCU and subsequently transferred to the floor.   PAST MEDICAL HISTORY: Significant for OSA, COPD, chronic low back pain, obstructive sleep apnea, degenerative joint disease, obesity, hypertension, type 2 diabetes.   PHYSICAL EXAMINATION: VITAL SIGNS: Temperature was 97.5, pulse 120, respirations 20, blood pressure 150/85, and pulse ox 97%.  GENERAL: She was not in distress. She was an obese, pleasant Caucasian female.  HEART: S1 and S2, irregular rhythm.  LUNGS: Clear to auscultation.  ABDOMEN: Soft with mild tenderness in the left lower quadrant area. No rebound.  EXTREMITIES: No edema.  NEUROLOGIC: Nonfocal.   LABORATORY DATA: Glucose 453, BUN 26, creatinine 1.28, sodium 130, potassium 4, chloride 100, CO2 22. GFR 41. Anion gap 11. Urinalysis showed 2+ leukocyte esterase with wbc clumps.    HOSPITAL COURSE: The patient was admitted and initially started on IV antibiotics with Rocephin for UTI. She was also started on Cardizem and was seen by cardiology and was placed on Cardizem 180 mg a day and subsequently metoprolol was also added to get her rate under control. The patient did have a chest x-ray which showed evidence of a 3 cm right lung mass suspicious for neoplasm. The patient was also seen in consultation by Dr. Lorre Nick, oncologist, who felt that a PET scan would be beneficial. PET scan showed indeterminate low-level activity within the new irregular right lower lobe mass/focal infiltrate, enlarging but small bilateral pleural effusions, nonspecific mildly asymmetric nonfocal breast activity, likely physiological. No hypermetabolic adenopathy or metastatic disease was noted. During her stay in the hospital, the patient appeared stable and a CT-guided lung biopsy was arranged for her as an outpatient, after discussion with the radiologist, and she was discharged in stable condition on the following medications.   DISCHARGE MEDICATIONS: Diltiazem CD 180 mg 1 capsule once a day, metoprolol tartrate 50 mg q. 12, metformin 1000 mg p.o. b.i.d., glipizide 10 mg p.o. b.i.d., nystatin topical powder b.i.d. for 10 days, and ProAir HFA 2 puffs q.i.d. p.r.n.   DISCHARGE INSTRUCTIONS: The patient was advised strict ADA, low carb diet and follow up with me, Dr. Marcello Fennel, in 1 to 2 weeks, also follow up with cardiologist, Dr. Lady Gary, in 1 to 2 weeks', and Dr. Lorre Nick, oncologist, as well. A CT-guided biopsy was arranged for him as an outpatient that will be performed on 06/11/2013.   TOTAL  TIME SPENT DISCHARGING THE PATIENT: 40 minutes.   ____________________________ Barbette ReichmannVishwanath Jozi Malachi, MD vh:sb D: 07/12/2013 13:09:00 ET T: 07/12/2013 13:33:30 ET JOB#: [id number]  cc: Barbette ReichmannVishwanath Ryka Beighley, MD, <Dictator> Barbette ReichmannVISHWANATH Trestan Vahle MD ELECTRONICALLY SIGNED 08/03/2013 13:18

## 2014-09-15 NOTE — H&P (Signed)
PATIENT NAMArdeth Beck:  [name], [name] MR#:  [id number] DATE OF BIRTH:  09/30/1935  DATE OF ADMISSION:  06/07/2011  CHIEF COMPLAINT: Cough with productive sputum, vomiting, weakness, decreased appetite, dizziness.   HISTORY OF PRESENT ILLNESS: [name] is a 79 year old female with a history of type 2 diabetes, hypertension, obesity, chronic back pain, obstructive sleep apnea syndrome, and chronic obstructive pulmonary disease who presents to the ER complaining of cough that started approximately a week back associated with brownish productive sputum. The patient also states that she started vomiting today, more than 10 times. She denies any fevers or chills. Denies any abdominal pain. No diarrhea. No ankle edema but overall has become weaker associated with decreased appetite.   PAST MEDICAL HISTORY:  1. Type 2 diabetes.  2. Hypertension.  3. Obesity.  4. Degenerative joint disease.  5. Chronic low back pain.  6. Obstructive sleep apnea.  7. Chronic obstructive pulmonary disease. 8. Hysterectomy. 9. Bilateral cataract surgeries.   CURRENT MEDICATIONS:  1. Atenolol 100 mg a day.  2. Aspirin 81 mg a day.  3. Celebrex 200 mg a day.  4. Glipizide 10 mg p.o. b.i.d.  5. Metformin 1000 mg p.o. b.i.d.  6. HCTZ 25 mg a day.  7. Antivert 25 mg p.o. b.i.d. p.r.n.   ALLERGIES: She is allergic to Darvon, Demerol, penicillin, and Reglan.  REVIEW OF SYSTEMS: The patient denies any chest pain. No headaches. No difficulty in urination. No diarrhea or vomiting or ankle edema. Denies any abdominal pain.   FAMILY HISTORY: Mother diabetic, chronic renal insufficiency. Dad had cerebrovascular accident and diabetes.   PHYSICAL EXAMINATION:  GENERAL: She is obese, in mild distress, alert and oriented.   HEENT: Normocephalic, atraumatic.   NECK: Supple.   LUNGS: Increased work of breathing with respiratory distress. Rhonchi present bilaterally. She was able to speak in 5 to 6 word sentences.   HEART:  S1, S2. No JVD.   ABDOMEN: Soft, obese, nontender. Bowel sounds 1+.   EXTREMITIES: No edema.   NEUROLOGIC: Alert and oriented times four.  No obvious focal deficits.   VITAL SIGNS: Temperature 98.4, pulse 83, respirations 24, blood pressure 178/64, oxygen saturation 99% on 2 liters nasal cannula.  In the ED, the patient received IV Solu-Medrol 40 mg times one and Levaquin 750 mg. Blood cultures have also been drawn.   LABS/STUDIES: WBC count 16.1, hemoglobin 11.7, platelet count 208, troponin less than 0.02, CPK 55, MB 0.5, glucose 333, BUN 10, creatinine 0.82, sodium 139, potassium 3.9, chloride 103, CO2 24, ALT 16, AST 13, albumin 3.3, anion gap 12. Chest x-ray showed patchy infiltrate versus atelectasis in the left mid lung and possibly left upper lobe. In the right lung base there was also an area of atelectasis versus minimal infiltrate. Borderline to mild cardiomegaly.   IMPRESSION:  1. Acute respiratory failure secondary to left mid and upper lobe pneumonia.  2. Uncontrolled type 2 diabetes.  3. Vomiting with dehydration.  4. Chronic back pain.  5. Type 2 diabetes.  6. History of hypertension.  7. History of obesity.   PLAN: [facility] We will start her on IV Levaquin 500 mg daily, also advised Solu-Medrol 20 mg IV q. 8. We will place the patient on sliding scale insulin. Continue her antihypertensives, i.e. atenolol, HCTZ, and lisinopril. We will also hydrate her with normal saline and provide supplemental oxygen and nebulized bronchodilator therapy.    ____________________________ Barbette ReichmannVishwanath Lurie Mullane, MD vh:bjt D: 06/07/2011 19:25:30 ET T: 06/08/2011 06:32:25 ET JOB#:  [id number]  cc: Barbette Reichmann, MD, <Dictator> Barbette Reichmann MD ELECTRONICALLY SIGNED 07/08/2011 12:35

## 2014-09-15 NOTE — Discharge Summary (Signed)
PATIENT NAMEGABBY, [name] MR#:  [id number] DATE OF BIRTH:  September 13, 1935  DATE OF ADMISSION:  06/07/2011 DATE OF DISCHARGE:  06/10/2011  DIAGNOSES AT TIME OF DISCHARGE: 1. Acute respiratory failure secondary to left mid and upper lobe pneumonia.  2. Type 2 diabetes.  3. Vomiting and dehydration.  4. Chronic back pain.  5. Hypertension.  6. Obesity.   CHIEF COMPLAINT: Cough, productive sputum, shortness of breath, decreased appetite, dizziness.   HISTORY OF PRESENT ILLNESS: [name] is a 78 year old female with history of type 2 diabetes, hypertension, obesity, chronic back pain, obstructive sleep apnea syndrome presented to the ER complaining of cough with productive sputum that started approximately a week back. She described the sputum as brownish in color. Subsequently patient started to vomit and had become weaker with decreased appetite and decreased p.o. intake.   PAST MEDICAL HISTORY:  1. Type 2 diabetes. 2. Hypertension. 3. Obesity. 4. Degenerative joint disease.  5. Chronic low back pain. 6. Sleep apnea. 7. Chronic obstructive pulmonary disease. 8. Hypertension. 9. Bilateral cataract surgeries.   CURRENT MEDICATIONS:  1. Atenolol 100 mg a day.  2. Aspirin 81 mg a day.  3. Celebrex 200 mg a day. 4. Glipizide 10 mg p.o. b.i.d.  5. Metformin 1000 mg p.o. b.i.d.  6. HCTZ 25 mg a day.  7. Antivert 25 mg p.o. b.i.d.   PHYSICAL EXAMINATION: GENERAL: She was in mild distress, alert and oriented. HEENT: Normocephalic, atraumatic. NECK: Supple. LUNGS: Increased work of breathing, respiratory distress and rhonchi present. She was able to speak in 5 to 6 word sentences. HEART: S1, S2. No JVD. ABDOMEN: Soft, obese. EXTREMITIES: No edema. NEUROLOGIC: Nonfocal. VITAL SIGNS: Temperature 98.4, pulse 83, respirations 24, blood pressure 178/64, oxygen saturation 99% on 2 liters nasal cannula.    LABORATORY, DIAGNOSTIC AND RADIOLOGICAL DATA: WBC count 16.1, hemoglobin 11.7, platelets  208, troponin less than 0.02, CPK 55, MB 0.5, glucose 333, BUN 10, creatinine 0.82, sodium 139, potassium 3.9, chloride 103, CO2 24, ALT 16, AST 13, albumin 3.3, anion gap 12. Chest x-ray showed patchy infiltrate versus atelectasis left mid lung and upper lobe area.   HOSPITAL COURSE: Patient was admitted to [facility]. She also underwent a CT of the chest without contrast which showed nonspecific ill defined opacity of left upper lobe, infectious versus inflammatory and radiologist advised a repeat CT scan in three months. She also had indeterminate pulmonary nodule less 4 mm in size. Patient received IV Levaquin. Her white cell count came back to baseline. Symptomatically she felt better. She was also started on IV Solu-Medrol and was subsequently switched to p.o. prednisone. She was ambulated and felt symptomatically better. She did have some bradycardia and beta blockers her were stopped and she was started on lisinopril 20 mg p.o. daily.   DISCHARGE MEDICATIONS: Patient was discharged on the following medications.  1. Levaquin 500 mg p.o. daily for seven days.  2. Lisinopril 20 mg a day.  3. Prednisone taper starting at 30 mg a day for three days, decrease to 20 mg a day for three days, decrease 10 mg a day for three days then stop. 4. Albuterol metered dose inhaler 2 puffs q.i.d. p.r.n.  5. Lisinopril 20 mg a day.  6. HCTZ 25 mg a day.  7. Metformin 1000 mg p.o. b.i.d.  8. Celebrex 200 mg p.o. daily.  9. Glipizide 10 mg p.o. b.i.d.   FOLLOW UP: Patient has been advised to follow up in the clinic with me, Dr. Marcello Fennel, in 1  to 2 weeks. She will need a repeat CT scan in three months to make sure that the density of the left lung is stable. Patient was advised to call back if there are any questions or concerns.  ____________________________ Barbette ReichmannVishwanath Amado Andal, MD vh:cms D: 06/12/2011 12:25:47 ET T: 06/12/2011 14:35:09 ET JOB#: [id number]  cc: Barbette ReichmannVishwanath [name], MD, <Dictator> Barbette ReichmannVISHWANATH Kenndra Morris  MD ELECTRONICALLY SIGNED 07/08/2011 12:35

## 2016-02-12 ENCOUNTER — Inpatient Hospital Stay: Payer: Commercial Managed Care - HMO

## 2016-02-12 ENCOUNTER — Emergency Department: Payer: Commercial Managed Care - HMO

## 2016-02-12 ENCOUNTER — Inpatient Hospital Stay
Admission: EM | Admit: 2016-02-12 | Discharge: 2016-03-24 | DRG: 356 | Disposition: E | Payer: Commercial Managed Care - HMO | Attending: Internal Medicine | Admitting: Internal Medicine

## 2016-02-12 ENCOUNTER — Encounter: Payer: Self-pay | Admitting: Emergency Medicine

## 2016-02-12 DIAGNOSIS — R112 Nausea with vomiting, unspecified: Secondary | ICD-10-CM

## 2016-02-12 DIAGNOSIS — K551 Chronic vascular disorders of intestine: Secondary | ICD-10-CM | POA: Diagnosis not present

## 2016-02-12 DIAGNOSIS — I1310 Hypertensive heart and chronic kidney disease without heart failure, with stage 1 through stage 4 chronic kidney disease, or unspecified chronic kidney disease: Secondary | ICD-10-CM | POA: Diagnosis present

## 2016-02-12 DIAGNOSIS — N170 Acute kidney failure with tubular necrosis: Secondary | ICD-10-CM | POA: Diagnosis not present

## 2016-02-12 DIAGNOSIS — Z66 Do not resuscitate: Secondary | ICD-10-CM | POA: Diagnosis not present

## 2016-02-12 DIAGNOSIS — A419 Sepsis, unspecified organism: Secondary | ICD-10-CM | POA: Diagnosis not present

## 2016-02-12 DIAGNOSIS — R0600 Dyspnea, unspecified: Secondary | ICD-10-CM | POA: Diagnosis not present

## 2016-02-12 DIAGNOSIS — R197 Diarrhea, unspecified: Secondary | ICD-10-CM | POA: Diagnosis not present

## 2016-02-12 DIAGNOSIS — K922 Gastrointestinal hemorrhage, unspecified: Secondary | ICD-10-CM | POA: Diagnosis not present

## 2016-02-12 DIAGNOSIS — E1122 Type 2 diabetes mellitus with diabetic chronic kidney disease: Secondary | ICD-10-CM | POA: Diagnosis present

## 2016-02-12 DIAGNOSIS — I480 Paroxysmal atrial fibrillation: Secondary | ICD-10-CM | POA: Diagnosis present

## 2016-02-12 DIAGNOSIS — R140 Abdominal distension (gaseous): Secondary | ICD-10-CM | POA: Diagnosis not present

## 2016-02-12 DIAGNOSIS — R4182 Altered mental status, unspecified: Secondary | ICD-10-CM

## 2016-02-12 DIAGNOSIS — E875 Hyperkalemia: Secondary | ICD-10-CM | POA: Diagnosis not present

## 2016-02-12 DIAGNOSIS — E871 Hypo-osmolality and hyponatremia: Secondary | ICD-10-CM | POA: Diagnosis not present

## 2016-02-12 DIAGNOSIS — Z01818 Encounter for other preprocedural examination: Secondary | ICD-10-CM

## 2016-02-12 DIAGNOSIS — J690 Pneumonitis due to inhalation of food and vomit: Secondary | ICD-10-CM | POA: Diagnosis not present

## 2016-02-12 DIAGNOSIS — R509 Fever, unspecified: Secondary | ICD-10-CM

## 2016-02-12 DIAGNOSIS — I469 Cardiac arrest, cause unspecified: Secondary | ICD-10-CM | POA: Diagnosis not present

## 2016-02-12 DIAGNOSIS — Z4659 Encounter for fitting and adjustment of other gastrointestinal appliance and device: Secondary | ICD-10-CM

## 2016-02-12 DIAGNOSIS — Z6841 Body Mass Index (BMI) 40.0 and over, adult: Secondary | ICD-10-CM

## 2016-02-12 DIAGNOSIS — K56609 Unspecified intestinal obstruction, unspecified as to partial versus complete obstruction: Secondary | ICD-10-CM | POA: Diagnosis not present

## 2016-02-12 DIAGNOSIS — Z833 Family history of diabetes mellitus: Secondary | ICD-10-CM

## 2016-02-12 DIAGNOSIS — R6521 Severe sepsis with septic shock: Secondary | ICD-10-CM | POA: Diagnosis not present

## 2016-02-12 DIAGNOSIS — Z9114 Patient's other noncompliance with medication regimen: Secondary | ICD-10-CM

## 2016-02-12 DIAGNOSIS — E1165 Type 2 diabetes mellitus with hyperglycemia: Secondary | ICD-10-CM | POA: Diagnosis present

## 2016-02-12 DIAGNOSIS — J810 Acute pulmonary edema: Secondary | ICD-10-CM | POA: Diagnosis not present

## 2016-02-12 DIAGNOSIS — E872 Acidosis, unspecified: Secondary | ICD-10-CM

## 2016-02-12 DIAGNOSIS — I4891 Unspecified atrial fibrillation: Secondary | ICD-10-CM

## 2016-02-12 DIAGNOSIS — E0801 Diabetes mellitus due to underlying condition with hyperosmolarity with coma: Secondary | ICD-10-CM | POA: Diagnosis not present

## 2016-02-12 DIAGNOSIS — J9600 Acute respiratory failure, unspecified whether with hypoxia or hypercapnia: Secondary | ICD-10-CM | POA: Diagnosis not present

## 2016-02-12 DIAGNOSIS — K55069 Acute infarction of intestine, part and extent unspecified: Secondary | ICD-10-CM

## 2016-02-12 DIAGNOSIS — Z515 Encounter for palliative care: Secondary | ICD-10-CM | POA: Diagnosis not present

## 2016-02-12 DIAGNOSIS — E782 Mixed hyperlipidemia: Secondary | ICD-10-CM | POA: Diagnosis present

## 2016-02-12 DIAGNOSIS — K920 Hematemesis: Secondary | ICD-10-CM | POA: Diagnosis present

## 2016-02-12 DIAGNOSIS — Z452 Encounter for adjustment and management of vascular access device: Secondary | ICD-10-CM

## 2016-02-12 DIAGNOSIS — R05 Cough: Secondary | ICD-10-CM

## 2016-02-12 DIAGNOSIS — I481 Persistent atrial fibrillation: Secondary | ICD-10-CM | POA: Diagnosis present

## 2016-02-12 DIAGNOSIS — J9601 Acute respiratory failure with hypoxia: Secondary | ICD-10-CM

## 2016-02-12 DIAGNOSIS — E119 Type 2 diabetes mellitus without complications: Secondary | ICD-10-CM

## 2016-02-12 DIAGNOSIS — K567 Ileus, unspecified: Secondary | ICD-10-CM | POA: Diagnosis not present

## 2016-02-12 DIAGNOSIS — Z888 Allergy status to other drugs, medicaments and biological substances status: Secondary | ICD-10-CM

## 2016-02-12 DIAGNOSIS — R059 Cough, unspecified: Secondary | ICD-10-CM

## 2016-02-12 DIAGNOSIS — N183 Chronic kidney disease, stage 3 (moderate): Secondary | ICD-10-CM | POA: Diagnosis present

## 2016-02-12 DIAGNOSIS — Z880 Allergy status to penicillin: Secondary | ICD-10-CM

## 2016-02-12 DIAGNOSIS — K449 Diaphragmatic hernia without obstruction or gangrene: Secondary | ICD-10-CM | POA: Diagnosis present

## 2016-02-12 DIAGNOSIS — G931 Anoxic brain damage, not elsewhere classified: Secondary | ICD-10-CM | POA: Diagnosis not present

## 2016-02-12 DIAGNOSIS — Z841 Family history of disorders of kidney and ureter: Secondary | ICD-10-CM

## 2016-02-12 DIAGNOSIS — K5790 Diverticulosis of intestine, part unspecified, without perforation or abscess without bleeding: Secondary | ICD-10-CM | POA: Diagnosis present

## 2016-02-12 DIAGNOSIS — E876 Hypokalemia: Secondary | ICD-10-CM | POA: Diagnosis not present

## 2016-02-12 DIAGNOSIS — T383X6A Underdosing of insulin and oral hypoglycemic [antidiabetic] drugs, initial encounter: Secondary | ICD-10-CM | POA: Diagnosis present

## 2016-02-12 DIAGNOSIS — I482 Chronic atrial fibrillation: Secondary | ICD-10-CM | POA: Diagnosis present

## 2016-02-12 DIAGNOSIS — I10 Essential (primary) hypertension: Secondary | ICD-10-CM | POA: Diagnosis present

## 2016-02-12 HISTORY — DX: Type 2 diabetes mellitus without complications: E11.9

## 2016-02-12 HISTORY — DX: Essential (primary) hypertension: I10

## 2016-02-12 HISTORY — DX: Unspecified atrial fibrillation: I48.91

## 2016-02-12 LAB — CBC WITH DIFFERENTIAL/PLATELET
BASOS ABS: 0 10*3/uL (ref 0–0.1)
BASOS PCT: 0 %
EOS ABS: 0 10*3/uL (ref 0–0.7)
Eosinophils Relative: 0 %
HEMATOCRIT: 44.1 % (ref 35.0–47.0)
HEMOGLOBIN: 14.8 g/dL (ref 12.0–16.0)
Lymphocytes Relative: 18 %
Lymphs Abs: 1.8 10*3/uL (ref 1.0–3.6)
MCH: 30.8 pg (ref 26.0–34.0)
MCHC: 33.5 g/dL (ref 32.0–36.0)
MCV: 91.8 fL (ref 80.0–100.0)
MONOS PCT: 4 %
Monocytes Absolute: 0.4 10*3/uL (ref 0.2–0.9)
NEUTROS ABS: 7.7 10*3/uL — AB (ref 1.4–6.5)
NEUTROS PCT: 78 %
Platelets: 196 10*3/uL (ref 150–440)
RBC: 4.81 MIL/uL (ref 3.80–5.20)
RDW: 14.1 % (ref 11.5–14.5)
WBC: 10 10*3/uL (ref 3.6–11.0)

## 2016-02-12 LAB — COMPREHENSIVE METABOLIC PANEL
ALK PHOS: 68 U/L (ref 38–126)
ALT: 16 U/L (ref 14–54)
ANION GAP: 14 (ref 5–15)
AST: 22 U/L (ref 15–41)
Albumin: 4 g/dL (ref 3.5–5.0)
BILIRUBIN TOTAL: 0.8 mg/dL (ref 0.3–1.2)
BUN: 20 mg/dL (ref 6–20)
CALCIUM: 9.3 mg/dL (ref 8.9–10.3)
CO2: 20 mmol/L — ABNORMAL LOW (ref 22–32)
CREATININE: 1.13 mg/dL — AB (ref 0.44–1.00)
Chloride: 104 mmol/L (ref 101–111)
GFR, EST AFRICAN AMERICAN: 53 mL/min — AB (ref >60)
GFR, EST NON AFRICAN AMERICAN: 45 mL/min — AB (ref >60)
Glucose, Bld: 446 mg/dL — ABNORMAL HIGH (ref 65–99)
Potassium: 3.6 mmol/L (ref 3.5–5.1)
SODIUM: 138 mmol/L (ref 135–145)
TOTAL PROTEIN: 8.2 g/dL — AB (ref 6.5–8.1)

## 2016-02-12 LAB — TYPE AND SCREEN
ABO/RH(D): O POS
ANTIBODY SCREEN: NEGATIVE

## 2016-02-12 LAB — PROTIME-INR
INR: 1
PROTHROMBIN TIME: 13.2 s (ref 11.4–15.2)

## 2016-02-12 LAB — MAGNESIUM: Magnesium: 1.4 mg/dL — ABNORMAL LOW (ref 1.7–2.4)

## 2016-02-12 LAB — LACTIC ACID, PLASMA: LACTIC ACID, VENOUS: 2 mmol/L — AB (ref 0.5–1.9)

## 2016-02-12 LAB — TROPONIN I

## 2016-02-12 MED ORDER — PROMETHAZINE HCL 25 MG/ML IJ SOLN
12.5000 mg | Freq: Once | INTRAMUSCULAR | Status: AC
  Administered 2016-02-12: 12.5 mg via INTRAVENOUS
  Filled 2016-02-12: qty 1

## 2016-02-12 MED ORDER — SODIUM CHLORIDE 0.9 % IV SOLN
1000.0000 mL | INTRAVENOUS | Status: DC
  Administered 2016-02-12: 1000 mL via INTRAVENOUS

## 2016-02-12 MED ORDER — PANTOPRAZOLE SODIUM 40 MG IV SOLR: 40.0000 mg | Freq: Two times a day (BID) | INTRAVENOUS | Status: DC

## 2016-02-12 MED ORDER — SODIUM CHLORIDE 0.9 % IV BOLUS (SEPSIS)
1000.0000 mL | Freq: Once | INTRAVENOUS | Status: AC
  Administered 2016-02-12: 1000 mL via INTRAVENOUS

## 2016-02-12 MED ORDER — DILTIAZEM HCL 25 MG/5ML IV SOLN
15.0000 mg | Freq: Once | INTRAVENOUS | Status: AC
  Administered 2016-02-12: 15 mg via INTRAVENOUS
  Filled 2016-02-12: qty 5

## 2016-02-12 MED ORDER — MORPHINE SULFATE (PF) 2 MG/ML IV SOLN
2.0000 mg | INTRAVENOUS | Status: DC | PRN
  Administered 2016-02-13 – 2016-02-23 (×8): 2 mg via INTRAVENOUS
  Filled 2016-02-12 (×8): qty 1

## 2016-02-12 MED ORDER — MAGNESIUM SULFATE 2 GM/50ML IV SOLN
2.0000 g | Freq: Once | INTRAVENOUS | Status: AC
  Administered 2016-02-12: 2 g via INTRAVENOUS
  Filled 2016-02-12: qty 50

## 2016-02-12 MED ORDER — SODIUM CHLORIDE 0.9 % IV SOLN
8.0000 mg/h | INTRAVENOUS | Status: DC
  Administered 2016-02-12 – 2016-02-13 (×2): 8 mg/h via INTRAVENOUS
  Filled 2016-02-12 (×3): qty 80

## 2016-02-12 MED ORDER — SODIUM CHLORIDE 0.9 % IV SOLN
80.0000 mg | Freq: Once | INTRAVENOUS | Status: AC
  Administered 2016-02-12: 80 mg via INTRAVENOUS
  Filled 2016-02-12: qty 80

## 2016-02-12 MED ORDER — IOPAMIDOL (ISOVUE-300) INJECTION 61%
30.0000 mL | Freq: Once | INTRAVENOUS | Status: AC | PRN
  Administered 2016-02-12: 30 mL via ORAL

## 2016-02-12 MED ORDER — DEXTROSE 5 % IV SOLN
1.0000 g | Freq: Once | INTRAVENOUS | Status: AC
  Administered 2016-02-12: 1 g via INTRAVENOUS
  Filled 2016-02-12: qty 10

## 2016-02-12 MED ORDER — IOPAMIDOL (ISOVUE-300) INJECTION 61%
75.0000 mL | Freq: Once | INTRAVENOUS | Status: AC | PRN
  Administered 2016-02-12: 75 mL via INTRAVENOUS

## 2016-02-12 MED ORDER — FENTANYL CITRATE (PF) 100 MCG/2ML IJ SOLN
100.0000 ug | INTRAMUSCULAR | Status: DC | PRN
  Administered 2016-02-12: 100 ug via INTRAVENOUS
  Filled 2016-02-12: qty 2

## 2016-02-12 MED ORDER — DILTIAZEM HCL 100 MG IV SOLR
5.0000 mg/h | Freq: Once | INTRAVENOUS | Status: AC
  Administered 2016-02-12: 5 mg/h via INTRAVENOUS
  Filled 2016-02-12: qty 100

## 2016-02-12 NOTE — ED Notes (Signed)
Pt has had multiple bm's of small amount of liquid stool with some visible blood.

## 2016-02-12 NOTE — ED Notes (Signed)
Fluids slowed down to 150cc/hr

## 2016-02-12 NOTE — ED Notes (Signed)
Type and screen redrawn at blood bank request

## 2016-02-12 NOTE — H&P (Signed)
[facility] [place] [facility]   PATIENT NAME: [name]    MR#:  [id number]  DATE OF BIRTH:  Sep 13, 1935  DATE OF ADMISSION:  2016/02/28  PRIMARY CARE PHYSICIAN: No PCP Per Patient   REQUESTING/REFERRING PHYSICIAN: Roxan Hockey, MD  CHIEF COMPLAINT:   Chief Complaint  Patient presents with  . Diarrhea  . Abdominal Pain  . Emesis    HISTORY OF PRESENT ILLNESS:  [name]  is a 80 y.o. female who presents with Acute onset abdominal pain with coffee-ground emesis and hematochezia starting today just after lunch time. Here in the ED her blood pressure stable, hemoglobin is stable, she has persistent abdominal pain and nausea. She is also in A. fib with RVR. Hospitalists were called for admission and further treatment.  PAST MEDICAL HISTORY:   Past Medical History:  Diagnosis Date  . Atrial fibrillation (HCC)   . Diabetes mellitus without complication (HCC)   . Hypertension     PAST SURGICAL HISTORY:   Past Surgical History:  Procedure Laterality Date  . ABDOMINAL HYSTERECTOMY      SOCIAL HISTORY:   Social History  Substance Use Topics  . Smoking status: Never Smoker  . Smokeless tobacco: Never Used  . Alcohol use No    FAMILY HISTORY:   Family History  Problem Relation Age of Onset  . Diabetes Mother   . Kidney failure Mother   . Diabetes Father   . Kidney failure Father   . Stroke Father     DRUG ALLERGIES:   Allergies  Allergen Reactions  . Demerol [Meperidine] Nausea And Vomiting  . Reglan [Metoclopramide] Swelling  . Penicillins Swelling and Rash    Swelling to arm at injection site. Has patient had a PCN reaction causing immediate rash, facial/tongue/throat swelling, SOB or lightheadedness with hypotension: Yes Has patient had a PCN reaction causing severe rash involving mucus membranes or skin necrosis: No Has patient had a PCN reaction that required hospitalization No Has patient had a PCN reaction occurring  within the last 10 years: No If all of the above answers are "NO", then may proceed with Cephalosporin use.     MEDICATIONS AT HOME:   Prior to Admission medications   Not on File    REVIEW OF SYSTEMS:  Review of Systems  Constitutional: Negative for chills, fever, malaise/fatigue and weight loss.  HENT: Negative for ear pain, hearing loss and tinnitus.   Eyes: Negative for blurred vision, double vision, pain and redness.  Respiratory: Negative for cough, hemoptysis and shortness of breath.   Cardiovascular: Negative for chest pain, palpitations, orthopnea and leg swelling.  Gastrointestinal: Positive for abdominal pain, nausea and vomiting. Negative for constipation and diarrhea.       Coffee-ground emesis, hematochezia  Genitourinary: Negative for dysuria, frequency and hematuria.  Musculoskeletal: Negative for back pain, joint pain and neck pain.  Skin:       No acne, rash, or lesions  Neurological: Negative for dizziness, tremors, focal weakness and weakness.  Endo/Heme/Allergies: Negative for polydipsia. Does not bruise/bleed easily.  Psychiatric/Behavioral: Negative for depression. The patient is not nervous/anxious and does not have insomnia.      VITAL SIGNS:   Vitals:   2016-02-28 2100 02-28-16 2130 2016/02/28 2140 2016/02/28 2200  BP: (!) 154/93 (!) 171/113 (!) 158/104 (!) 172/107  Pulse: 82 (!) 125 93 (!) 123  Resp: (!) 24 (!) 25 (!) 27 (!) 28  Temp:      TempSrc:  SpO2: 98% 97% 97% 98%  Weight:      Height:       Wt Readings from Last 3 Encounters:  02/21/2016 102.1 kg (225 lb)    PHYSICAL EXAMINATION:  Physical Exam  Vitals reviewed. Constitutional: She is oriented to person, place, and time. She appears well-developed and well-nourished. No distress.  HENT:  Head: Normocephalic and atraumatic.  Mouth/Throat: Oropharynx is clear and moist.  Eyes: Conjunctivae and EOM are normal. Pupils are equal, round, and reactive to light. No scleral icterus.  Neck:  Normal range of motion. Neck supple. No JVD present. No thyromegaly present.  Cardiovascular: Intact distal pulses.  Exam reveals no gallop and no friction rub.   No murmur heard. Irregular rhythm, tachycardic  Respiratory: Effort normal and breath sounds normal. No respiratory distress. She has no wheezes. She has no rales.  GI: Soft. Bowel sounds are normal. She exhibits no distension. There is tenderness.  Musculoskeletal: Normal range of motion. She exhibits no edema.  No arthritis, no gout  Lymphadenopathy:    She has no cervical adenopathy.  Neurological: She is alert and oriented to person, place, and time. No cranial nerve deficit.  No dysarthria, no aphasia  Skin: Skin is warm and dry. No rash noted. No erythema.  Psychiatric: She has a normal mood and affect. Her behavior is normal. Judgment and thought content normal.    LABORATORY PANEL:   CBC  Recent Labs Lab 01/24/2016 1830  WBC 10.0  HGB 14.8  HCT 44.1  PLT 196   ------------------------------------------------------------------------------------------------------------------  Chemistries   Recent Labs Lab 02/02/2016 1830 02/04/2016 1921  NA 138  --   K 3.6  --   CL 104  --   CO2 20*  --   GLUCOSE 446*  --   BUN 20  --   CREATININE 1.13*  --   CALCIUM 9.3  --   MG  --  1.4*  AST 22  --   ALT 16  --   ALKPHOS 68  --   BILITOT 0.8  --    ------------------------------------------------------------------------------------------------------------------  Cardiac Enzymes  Recent Labs Lab 02/19/2016 1830  TROPONINI <0.03   ------------------------------------------------------------------------------------------------------------------  RADIOLOGY:  Ct Abdomen Pelvis W Contrast  Result Date: 02/05/2016 CLINICAL DATA:  Acute onset of epigastric abdominal pain and nausea. Vomiting and diarrhea. Initial encounter. EXAM: CT ABDOMEN AND PELVIS WITH CONTRAST TECHNIQUE: Multidetector CT imaging of the abdomen  and pelvis was performed using the standard protocol following bolus administration of intravenous contrast. CONTRAST:  75mL ISOVUE-300 IOPAMIDOL (ISOVUE-300) INJECTION 61% COMPARISON:  PET/CT performed 07/09/2013 FINDINGS: Lower chest: Mild bibasilar atelectasis is noted. The previously noted right lower lobe masslike density has resolved. A small to moderate hiatal hernia is seen, with fluid filling the distal esophagus. Hepatobiliary: The liver is unremarkable in appearance. The gallbladder is unremarkable in appearance. The common bile duct remains normal in caliber. Pancreas: The pancreas is within normal limits. Spleen: The spleen is unremarkable in appearance. A likely small calcified aneurysm is noted at the splenic hilum. Adrenals/Urinary Tract: The adrenal glands are unremarkable in appearance. Mild bilateral renal atrophy and scarring are noted, with underlying perinephric stranding. There is no evidence of hydronephrosis. No renal or ureteral stones are identified. Stomach/Bowel: The stomach is otherwise unremarkable in appearance. The small bowel is within normal limits. The appendix is normal in caliber, without evidence of appendicitis. Mild diverticulosis is noted along the descending and sigmoid colon, without evidence of diverticulitis. Vascular/Lymphatic: Scattered calcification is seen along the  abdominal aorta and its branches. The abdominal aorta is otherwise grossly unremarkable. The inferior vena cava is grossly unremarkable. No retroperitoneal lymphadenopathy is seen. No pelvic sidewall lymphadenopathy is identified. Reproductive: The bladder is significantly distended and grossly unremarkable. The patient is status post hysterectomy. No suspicious adnexal masses are seen. Other: No additional soft tissue abnormalities are seen. Musculoskeletal: No acute osseous abnormalities are identified. Endplate sclerotic change is noted at L4-L5, with associated vacuum phenomenon along the lower lumbar  spine. There is chronic loss of height at vertebral body T12. The visualized musculature is unremarkable in appearance. IMPRESSION: 1. No acute abnormality seen to explain the patient's symptoms. 2. Small to moderate hiatal hernia noted. Fluid fills the distal esophagus, likely reflecting mild esophageal dysmotility or gastroesophageal reflux. 3. Mild bibasilar atelectasis noted. 4. Aortic atherosclerosis. 5. Mild bilateral renal atrophy and scarring noted. 6. Mild diverticulosis along the descending and sigmoid colon, without evidence of diverticulitis. 7. Chronic loss of height at vertebral body T12, and mild degenerative change at the lower lumbar spine. Electronically Signed   By: Roanna Raider M.D.   On: 03/03/16 21:12   Dg Chest Port 1 View  Result Date: 2016/03/03 CLINICAL DATA:  80 y/o F; nausea vomiting and diarrhea with pain starting at approximately 12 p.m. today. EXAM: PORTABLE CHEST 1 VIEW COMPARISON:  07/12/2013 chest radiograph. FINDINGS: Stable enlarged cardiac silhouette given differences in technique. Aortic atherosclerosis with calcifications of the arch. No focal consolidation, pneumothorax, or pleural effusion. IMPRESSION: No active disease.  Stable cardiomegaly and aortic atherosclerosis. Electronically Signed   By: Mitzi Hansen M.D.   On: 03-Mar-2016 19:26    EKG:   Orders placed or performed during the hospital encounter of Mar 03, 2016  . EKG 12-Lead  . EKG 12-Lead    IMPRESSION AND PLAN:  Principal Problem:   GI bleed - NPO, protonix drip, IV fluids, monitor serial hemoglobin, GI consult Active Problems:   Atrial fibrillation with RVR (HCC) - cardizem drip, pain control PRN, IV fluids as above   HTN (hypertension) - continue home meds and cardizem drip as above   Diabetes (HCC) - SSI q6 hours while NPO  All the records are reviewed and case discussed with ED provider. Management plans discussed with the patient and/or family.  DVT PROPHYLAXIS: Mechanical  only  GI PROPHYLAXIS: PPI  ADMISSION STATUS: Inpatient  CODE STATUS: Full Code Status History    This patient does not have a recorded code status. Please follow your organizational policy for patients in this situation.      TOTAL TIME TAKING CARE OF THIS PATIENT: 45 minutes.    Ja Ohman FIELDING 03-03-16, 10:23 PM  [facility]  Office  [phone]  CC: Primary care physician; No PCP Per Patient

## 2016-02-12 NOTE — ED Triage Notes (Addendum)
Pt presents to ED with c/o N/V/D and abdominal pain that started at approx 1200pm today. Pt states multiple episodes of emesis and diarrhea at this time. Pt c/o upper abdominal pain at this time. Pt noted to be diaphoretic to the touch.

## 2016-02-12 NOTE — ED Notes (Signed)
cardizem titrated to 10mg /hr with a 15mg  bolus per dr. order

## 2016-02-12 NOTE — ED Notes (Signed)
cardizem titrated up to 15mg /hr

## 2016-02-12 NOTE — ED Provider Notes (Signed)
[facility] Emergency Department Provider Note    First MD Initiated Contact with Patient 02/15/2016 1833     (approximate)  I have reviewed the triage vital signs and the nursing notes.   HISTORY  Chief Complaint Diarrhea; Abdominal Pain; and Emesis    HPI [name] is a 80 y.o. female presents with diarrhea, abdominal pain and coffee-ground emesis as well as hematochezia. Complaining of severe epigastric pain. States she has a history of A. fib and diabetes but has not been taking her medications for the past several months. Denies any history of GI bleeds or diverticulosis. Denies any shortness of breath. Does have heart palpitations but states that she has a history of A. fib. Denies any recent fevers.   Past Medical History:  Diagnosis Date  . Atrial fibrillation (HCC)   . Diabetes mellitus without complication (HCC)   . Hypertension     Patient Active Problem List   Diagnosis Date Noted  . GI bleed 02/04/2016  . Atrial fibrillation with RVR (HCC) 02/09/2016  . HTN (hypertension) 01/26/2016  . Diabetes (HCC) 02/13/2016    Past Surgical History:  Procedure Laterality Date  . ABDOMINAL HYSTERECTOMY      Prior to Admission medications   Not on File    Allergies Demerol [meperidine]; Reglan [metoclopramide]; and Penicillins  Family History  Problem Relation Age of Onset  . Diabetes Mother   . Kidney failure Mother   . Diabetes Father   . Kidney failure Father   . Stroke Father     Social History Social History  Substance Use Topics  . Smoking status: Never Smoker  . Smokeless tobacco: Never Used  . Alcohol use No    Review of Systems Patient denies headaches, rhinorrhea, blurry vision, numbness, shortness of breath, chest pain, edema, cough, abdominal pain, nausea, vomiting, diarrhea, dysuria, fevers, rashes or hallucinations unless otherwise stated above in  HPI. ____________________________________________   PHYSICAL EXAM:  VITAL SIGNS: Vitals:   02/15/2016 2200 02/08/2016 2230  BP: (!) 172/107 (!) 160/109  Pulse: (!) 123 (!) 128  Resp: (!) 28 (!) 21  Temp:      Constitutional: Alert and oriented. Critically ill appearing Eyes: Conjunctivae are normal. PERRL. EOMI. Head: Atraumatic. Nose: No congestion/rhinnorhea. Mouth/Throat: Mucous membranes are moist.  Oropharynx non-erythematous. Neck: No stridor. Painless ROM. No cervical spine tenderness to palpation Hematological/Lymphatic/Immunilogical: No cervical lymphadenopathy. Cardiovascular: Tachycardic and irregularly irregular rhythm. Grossly normal heart sounds.  Good peripheral circulation. Respiratory: Normal respiratory effort.  No retractions. Lungs CTAB. Gastrointestinal: Soft epigastric tenderness to palpation No distention. No abdominal bruits. No CVA tenderness. Musculoskeletal: No lower extremity tenderness nor edema.  No joint effusions. Neurologic:  Normal speech and language. No gross focal neurologic deficits are appreciated. No gait instability. Skin:  Skin is warm, dry and intact. No rash noted. Psychiatric: Mood and affect are normal. Speech and behavior are normal.  ____________________________________________   LABS (all labs ordered are listed, but only abnormal results are displayed)  Results for orders placed or performed during the hospital encounter of 02/14/2016 (from the past 24 hour(s))  Comprehensive metabolic panel     Status: Abnormal   Collection Time: 02/05/2016  6:30 PM  Result Value Ref Range   Sodium 138 135 - 145 mmol/L   Potassium 3.6 3.5 - 5.1 mmol/L   Chloride 104 101 - 111 mmol/L   CO2 20 (L) 22 - 32 mmol/L   Glucose, Bld 446 (H) 65 - 99 mg/dL   BUN  20 6 - 20 mg/dL   Creatinine, Ser 1.611.13 (H) 0.44 - 1.00 mg/dL   Calcium 9.3 8.9 - 09.610.3 mg/dL   Total Protein 8.2 (H) 6.5 - 8.1 g/dL   Albumin 4.0 3.5 - 5.0 g/dL   AST 22 15 - 41 U/L   ALT 16 14  - 54 U/L   Alkaline Phosphatase 68 38 - 126 U/L   Total Bilirubin 0.8 0.3 - 1.2 mg/dL   GFR calc non Af Amer 45 (L) >60 mL/min   GFR calc Af Amer 53 (L) >60 mL/min   Anion gap 14 5 - 15  CBC WITH DIFFERENTIAL     Status: Abnormal   Collection Time: 02/15/2016  6:30 PM  Result Value Ref Range   WBC 10.0 3.6 - 11.0 K/uL   RBC 4.81 3.80 - 5.20 MIL/uL   Hemoglobin 14.8 12.0 - 16.0 g/dL   HCT 04.544.1 40.935.0 - 81.147.0 %   MCV 91.8 80.0 - 100.0 fL   MCH 30.8 26.0 - 34.0 pg   MCHC 33.5 32.0 - 36.0 g/dL   RDW 91.414.1 78.211.5 - 95.614.5 %   Platelets 196 150 - 440 K/uL   Neutrophils Relative % 78 %   Neutro Abs 7.7 (H) 1.4 - 6.5 K/uL   Lymphocytes Relative 18 %   Lymphs Abs 1.8 1.0 - 3.6 K/uL   Monocytes Relative 4 %   Monocytes Absolute 0.4 0.2 - 0.9 K/uL   Eosinophils Relative 0 %   Eosinophils Absolute 0.0 0 - 0.7 K/uL   Basophils Relative 0 %   Basophils Absolute 0.0 0 - 0.1 K/uL  Troponin I     Status: None   Collection Time: 02/03/2016  6:30 PM  Result Value Ref Range   Troponin I <0.03 <0.03 ng/mL  Protime-INR     Status: None   Collection Time: 02/01/2016  6:30 PM  Result Value Ref Range   Prothrombin Time 13.2 11.4 - 15.2 seconds   INR 1.00   Lactic acid, plasma     Status: Abnormal   Collection Time: 02/08/2016  7:20 PM  Result Value Ref Range   Lactic Acid, Venous 2.0 (HH) 0.5 - 1.9 mmol/L  Magnesium     Status: Abnormal   Collection Time: 02/18/2016  7:21 PM  Result Value Ref Range   Magnesium 1.4 (L) 1.7 - 2.4 mg/dL  Type and screen     Status: None   Collection Time: 02/18/2016  7:35 PM  Result Value Ref Range   ABO/RH(D) O POS    Antibody Screen NEG    Sample Expiration 02/15/2016    ____________________________________________  EKG My review and personal interpretation at Time: 18:19   Indication: tachycardia  Rate: 150  Rhythm: afib w rvr Axis: normal Other: nonspecific st changes, no acute ischemia ____________________________________________  RADIOLOGY  See  chart ____________________________________________   PROCEDURES  Procedure(s) performed: none    Critical Care performed: yes CRITICAL CARE Performed by: Willy EddyPatrick Carolyne Whitsel   Total critical care time: 50 minutes  Critical care time was exclusive of separately billable procedures and treating other patients.  Critical care was necessary to treat or prevent imminent or life-threatening deterioration.  Critical care was time spent personally by me on the following activities: development of treatment plan with patient and/or surrogate as well as nursing, discussions with consultants, evaluation of patient's response to treatment, examination of patient, obtaining history from patient or surrogate, ordering and performing treatments and interventions, ordering and review of laboratory studies, ordering and  review of radiographic studies, pulse oximetry and re-evaluation of patient's condition.  ____________________________________________   INITIAL IMPRESSION / ASSESSMENT AND PLAN / ED COURSE  Pertinent labs & imaging results that were available during my care of the patient were reviewed by me and considered in my medical decision making (see chart for details).  DDX: Cholecystitis, gastritis, SBO, ileus, GI bleed, pancreatitis  LASHAWNTA BURGERT is a 80 y.o. who presents to the ED with acute nausea vomiting and tachycardia with epigastric tenderness. Patient is normotensive but appears critically ill. She does have A. fib with RVR. We'll start with IV fluid bolus as well as laboratory evaluation and imaging to evaluate for acute complaints.  The patient will be placed on continuous pulse oximetry and telemetry for monitoring.  Laboratory evaluation will be sent to evaluate for the above complaints.     Clinical Course  Comment By Time  Patient reassessed blood pressure and heart rate improving with increasing titration of Cardizem. Hemoglobin is normal lactate elevated. We will  gently provide fluid resuscitation as she does have crackles in the bases concerning for worsening edema. Willy Eddy, MD 09/21 2134  Also has hypomagnesemia. Protonix strip and bolus started for presumed upper GI bleed. CT imaging does not show any evidence of obstruction or pneumatosis. Spoke with Dr. Servando Snare a GI who will be available for any decompensation. Willy Eddy, MD 09/21 2134   ----------------------------------------- 10:46 PM on 02/13/2016 -----------------------------------------  Patient reassessed. Pain is improving. Patient still with tachycardia requiring increased Cardizem drip. Hypertension also improving with Cardizem. Patient does have increased O2 requirements. Will order repeat chest x-ray to evaluate for any worsening edema. I spoke with Dr. Anne Hahn who agrees to admit patient for further evaluation and management.  ____________________________________________   FINAL CLINICAL IMPRESSION(S) / ED DIAGNOSES  Final diagnoses:  Upper GI bleed  Nausea vomiting and diarrhea  Metabolic acidosis  Acute respiratory failure with hypoxia (HCC)  Atrial fibrillation with rapid ventricular response (HCC)      NEW MEDICATIONS STARTED DURING THIS VISIT:  New Prescriptions   No medications on file     Note:  This document was prepared using Dragon voice recognition software and may include unintentional dictation errors.    Willy Eddy, MD 02/07/2016 [phone]

## 2016-02-12 NOTE — ED Notes (Signed)
Pt lives with her son. Stopped taking her medications 3-4 months ago - states she forgets. Son does not remind her. Counseled son on making sure she takes her medications.

## 2016-02-12 NOTE — ED Notes (Signed)
Upon patient arrival to the room. Pt has 1 episode of coffee ground emesis.

## 2016-02-13 LAB — GLUCOSE, CAPILLARY
GLUCOSE-CAPILLARY: 576 mg/dL — AB (ref 65–99)
GLUCOSE-CAPILLARY: 308 mg/dL — AB (ref 65–99)
GLUCOSE-CAPILLARY: 272 mg/dL — AB (ref 65–99)
GLUCOSE-CAPILLARY: 111 mg/dL — AB (ref 65–99)
GLUCOSE-CAPILLARY: 114 mg/dL — AB (ref 65–99)
GLUCOSE-CAPILLARY: 127 mg/dL — AB (ref 65–99)
GLUCOSE-CAPILLARY: 169 mg/dL — AB (ref 65–99)
Glucose-Capillary: 120 mg/dL — ABNORMAL HIGH (ref 65–99)
Glucose-Capillary: 139 mg/dL — ABNORMAL HIGH (ref 65–99)
Glucose-Capillary: 146 mg/dL — ABNORMAL HIGH (ref 65–99)
Glucose-Capillary: 148 mg/dL — ABNORMAL HIGH (ref 65–99)
Glucose-Capillary: 150 mg/dL — ABNORMAL HIGH (ref 65–99)
Glucose-Capillary: 165 mg/dL — ABNORMAL HIGH (ref 65–99)
Glucose-Capillary: 204 mg/dL — ABNORMAL HIGH (ref 65–99)
Glucose-Capillary: 230 mg/dL — ABNORMAL HIGH (ref 65–99)
Glucose-Capillary: 286 mg/dL — ABNORMAL HIGH (ref 65–99)
Glucose-Capillary: 391 mg/dL — ABNORMAL HIGH (ref 65–99)
Glucose-Capillary: 428 mg/dL — ABNORMAL HIGH (ref 65–99)
Glucose-Capillary: 464 mg/dL — ABNORMAL HIGH (ref 65–99)
Glucose-Capillary: 504 mg/dL (ref 65–99)
Glucose-Capillary: 511 mg/dL (ref 65–99)

## 2016-02-13 LAB — CBC
HCT: 47.7 % — ABNORMAL HIGH (ref 35.0–47.0)
Hemoglobin: 15.7 g/dL (ref 12.0–16.0)
MCH: 30.6 pg (ref 26.0–34.0)
MCHC: 32.9 g/dL (ref 32.0–36.0)
MCV: 93 fL (ref 80.0–100.0)
PLATELETS: 201 10*3/uL (ref 150–440)
RBC: 5.13 MIL/uL (ref 3.80–5.20)
RDW: 14.6 % — AB (ref 11.5–14.5)
WBC: 11.1 10*3/uL — ABNORMAL HIGH (ref 3.6–11.0)

## 2016-02-13 LAB — HEMOGLOBIN
HEMOGLOBIN: 14.7 g/dL (ref 12.0–16.0)
Hemoglobin: 13.4 g/dL (ref 12.0–16.0)

## 2016-02-13 LAB — URINALYSIS COMPLETE WITH MICROSCOPIC (ARMC ONLY)
Bilirubin Urine: NEGATIVE
Glucose, UA: >500 mg/dL — AB
Leukocytes, UA: NEGATIVE
Nitrite: NEGATIVE
PH: 5 (ref 5.0–8.0)
PROTEIN: 100 mg/dL — AB
Specific Gravity, Urine: 1.023 (ref 1.005–1.030)

## 2016-02-13 LAB — BASIC METABOLIC PANEL
Anion gap: 15 (ref 5–15)
BUN: 20 mg/dL (ref 6–20)
CHLORIDE: 108 mmol/L (ref 101–111)
CO2: 19 mmol/L — AB (ref 22–32)
CREATININE: 1.24 mg/dL — AB (ref 0.44–1.00)
Calcium: 9.1 mg/dL (ref 8.9–10.3)
GFR calc Af Amer: 47 mL/min — ABNORMAL LOW (ref >60)
GFR calc non Af Amer: 41 mL/min — ABNORMAL LOW (ref >60)
GLUCOSE: 447 mg/dL — AB (ref 65–99)
Potassium: 3.7 mmol/L (ref 3.5–5.1)
Sodium: 142 mmol/L (ref 135–145)

## 2016-02-13 LAB — MRSA PCR SCREENING: MRSA by PCR: NEGATIVE

## 2016-02-13 MED ORDER — PANTOPRAZOLE SODIUM 40 MG IV SOLR
40.0000 mg | Freq: Two times a day (BID) | INTRAVENOUS | Status: DC
  Administered 2016-02-14 – 2016-02-16 (×5): 40 mg via INTRAVENOUS
  Filled 2016-02-13 (×5): qty 40

## 2016-02-13 MED ORDER — ONDANSETRON HCL 4 MG PO TABS
4.0000 mg | ORAL_TABLET | Freq: Four times a day (QID) | ORAL | Status: DC | PRN
  Administered 2016-02-22: 4 mg via ORAL

## 2016-02-13 MED ORDER — SODIUM CHLORIDE 0.9 % IV BOLUS (SEPSIS)
250.0000 mL | Freq: Once | INTRAVENOUS | Status: AC
  Administered 2016-02-13: 250 mL via INTRAVENOUS

## 2016-02-13 MED ORDER — POTASSIUM CHLORIDE 10 MEQ/100ML IV SOLN
10.0000 meq | Freq: Once | INTRAVENOUS | Status: AC
  Administered 2016-02-13: 10 meq via INTRAVENOUS
  Filled 2016-02-13: qty 100

## 2016-02-13 MED ORDER — INSULIN GLARGINE 100 UNIT/ML ~~LOC~~ SOLN
10.0000 [IU] | SUBCUTANEOUS | Status: DC
Start: 2016-02-13 — End: 2016-02-14
  Administered 2016-02-13: 10 [IU] via SUBCUTANEOUS
  Filled 2016-02-13: qty 0.1

## 2016-02-13 MED ORDER — SODIUM CHLORIDE 0.9 % IV SOLN
INTRAVENOUS | Status: DC
  Administered 2016-02-13: 8.5 [IU]/h via INTRAVENOUS
  Administered 2016-02-13: 4 [IU]/h via INTRAVENOUS
  Administered 2016-02-13: 11.3 [IU]/h via INTRAVENOUS
  Administered 2016-02-13: 10.1 [IU]/h via INTRAVENOUS
  Administered 2016-02-13: 10.2 [IU]/h via INTRAVENOUS
  Filled 2016-02-13: qty 2.5

## 2016-02-13 MED ORDER — MAGNESIUM SULFATE 2 GM/50ML IV SOLN
2.0000 g | Freq: Once | INTRAVENOUS | Status: AC
  Administered 2016-02-13: 2 g via INTRAVENOUS
  Filled 2016-02-13: qty 50

## 2016-02-13 MED ORDER — SODIUM CHLORIDE 0.9 % IV SOLN
1000.0000 mL | INTRAVENOUS | Status: DC
  Administered 2016-02-13 (×2): 1000 mL via INTRAVENOUS

## 2016-02-13 MED ORDER — DILTIAZEM HCL 100 MG IV SOLR
5.0000 mg/h | INTRAVENOUS | Status: DC
  Administered 2016-02-13: 5 mg/h via INTRAVENOUS
  Administered 2016-02-13: 15 mg/h via INTRAVENOUS
  Administered 2016-02-13: 10 mg/h via INTRAVENOUS
  Administered 2016-02-13: 15 mg/h via INTRAVENOUS
  Filled 2016-02-13 (×2): qty 100

## 2016-02-13 MED ORDER — SODIUM CHLORIDE 0.9% FLUSH
3.0000 mL | Freq: Two times a day (BID) | INTRAVENOUS | Status: DC
  Administered 2016-02-13 – 2016-02-21 (×15): 3 mL via INTRAVENOUS
  Administered 2016-02-21 – 2016-02-22 (×5): 10 mL via INTRAVENOUS
  Administered 2016-02-23: 3 mL via INTRAVENOUS
  Administered 2016-02-23: 10 mL via INTRAVENOUS
  Administered 2016-02-24 – 2016-02-27 (×6): 3 mL via INTRAVENOUS

## 2016-02-13 MED ORDER — INSULIN ASPART 100 UNIT/ML ~~LOC~~ SOLN: 1.0000 [IU] | SUBCUTANEOUS | Status: DC

## 2016-02-13 MED ORDER — SODIUM CHLORIDE 0.9 % IV SOLN
INTRAVENOUS | Status: DC
  Filled 2016-02-13: qty 2.5

## 2016-02-13 MED ORDER — METOPROLOL TARTRATE 50 MG PO TABS
50.0000 mg | ORAL_TABLET | Freq: Two times a day (BID) | ORAL | Status: DC
  Administered 2016-02-13 – 2016-02-14 (×4): 50 mg via ORAL
  Filled 2016-02-13 (×4): qty 1

## 2016-02-13 MED ORDER — INSULIN ASPART 100 UNIT/ML ~~LOC~~ SOLN
2.0000 [IU] | SUBCUTANEOUS | Status: DC
  Administered 2016-02-13: 2 [IU] via SUBCUTANEOUS
  Administered 2016-02-13: 4 [IU] via SUBCUTANEOUS
  Administered 2016-02-14 (×3): 6 [IU] via SUBCUTANEOUS
  Filled 2016-02-13 (×2): qty 6
  Filled 2016-02-13: qty 2
  Filled 2016-02-13: qty 6
  Filled 2016-02-13: qty 4

## 2016-02-13 MED ORDER — DILTIAZEM HCL 30 MG PO TABS
30.0000 mg | ORAL_TABLET | Freq: Four times a day (QID) | ORAL | Status: DC
  Administered 2016-02-13 – 2016-02-14 (×6): 30 mg via ORAL
  Filled 2016-02-13 (×6): qty 1

## 2016-02-13 MED ORDER — ONDANSETRON HCL 4 MG/2ML IJ SOLN
4.0000 mg | Freq: Four times a day (QID) | INTRAMUSCULAR | Status: DC | PRN
  Administered 2016-02-13 – 2016-02-27 (×2): 4 mg via INTRAVENOUS
  Filled 2016-02-13 (×2): qty 2

## 2016-02-13 MED ORDER — INSULIN GLARGINE 100 UNIT/ML ~~LOC~~ SOLN
10.0000 [IU] | SUBCUTANEOUS | Status: DC
  Filled 2016-02-13: qty 0.1

## 2016-02-13 MED ORDER — INSULIN ASPART 100 UNIT/ML ~~LOC~~ SOLN
0.0000 [IU] | Freq: Four times a day (QID) | SUBCUTANEOUS | Status: DC
  Administered 2016-02-13: 9 [IU] via SUBCUTANEOUS
  Filled 2016-02-13: qty 9

## 2016-02-13 MED ORDER — POTASSIUM CHLORIDE 10 MEQ/100ML IV SOLN
10.0000 meq | INTRAVENOUS | Status: AC
  Administered 2016-02-13 (×2): 10 meq via INTRAVENOUS
  Filled 2016-02-13 (×2): qty 100

## 2016-02-13 MED ORDER — INSULIN REGULAR HUMAN 100 UNIT/ML IJ SOLN
5.0000 [IU] | Freq: Once | INTRAMUSCULAR | Status: AC
  Administered 2016-02-13: 5 [IU] via INTRAVENOUS
  Filled 2016-02-13: qty 0.05

## 2016-02-13 MED ORDER — DEXTROSE 10 % IV SOLN: INTRAVENOUS | Status: DC | PRN

## 2016-02-13 NOTE — Care Management Note (Signed)
Case Management Note  Patient Details  Name: [name] CarrelBobbie L Beck MRN: [id number] Date of Birth: 12/28/1935  Subjective/Objective:                   PCP Dr. Marcello FennelHande. Lives with  [name],[name] Son [phone]  He does not work and is with her 24/7 in the home along with patient's daughter. Per [name]: She "refuses to take meds because they make her sick and sometimes cause diarrhea". They are affordable "she just don't want to take them".  She does a little bit of housework but states it hurts her back and Freight forwarder([name]) takes over". She is supposed to be using a walker but does not use it per [name]. She fell about 2 years ago. No history of home health services however [facility]. [name] does not feel that she needs home health nursing since [facility] than patient likes them to. She has never gone to SNF for rehab. She does not drive; her son [name] ScrapeJohnnie drives her to appointments. He denies that patient has any CM needs.  Action/Plan: Explained that her not taking her medications could cause bigger issues such as hospitalization- he said that "she knows that and we've told her".    Expected Discharge Date:                  Expected Discharge Plan:     In-House Referral:     Discharge planning Services     Post Acute Care Choice:    Choice offered to:  Adult Children  DME Arranged:    DME Agency:     HH Arranged:    HH Agency:     Status of Service:  In process, will continue to follow  If discussed at Long Length of Stay Meetings, dates discussed:    Additional Comments:  Collie Siadngela Deniyah Dillavou, RN 02/13/2016, 2:44 PM

## 2016-02-13 NOTE — Progress Notes (Signed)
FSBS recheck 511.  Informed Dr Anne HahnWillis of such.  New orders received.  Will continue to monitor

## 2016-02-13 NOTE — Progress Notes (Signed)
Report called to [facility] on [facility]. Pt to be transferred to room 245. Family member in room to notify other family members.

## 2016-02-13 NOTE — Progress Notes (Signed)
FSBS 504.  Informed Dr Anne HahnWillis.  Orders to give 9 units .  Will continue to monitor,

## 2016-02-13 NOTE — Progress Notes (Signed)
FSBS recheck value of 576.   Informed Dr Sheryle Hailiamond of such.  Awaiting new orders.  Will continue to monitor.

## 2016-02-13 NOTE — Progress Notes (Signed)
Pt transfer to room 245, NT and family left floor with patient.

## 2016-02-13 NOTE — Clinical Social Work Note (Signed)
CSW received consult for noncompliance with medications. CSW notes that ED Nurse documented that patient sometimes forgets to take her medications and that her son does not remind her. CSW has informed RN CM. Patient may benefit from a pill box or possibly home health for medication management. Please re-consult CSW if needed. York SpanielMonica Jeronda Don MSW,LCSW [phone]

## 2016-02-13 NOTE — Progress Notes (Signed)
PHARMACY CONSULT NOTE  Pharmacy consulted to assist in electroylyte management in this [age] yoF admitted on 9/21 with GI bleed. Currently on insulin drip.  1. Electrolytes: 9/22 am labs K= 3.7; no magnesium level. Pt will receive K 10 mEq x 2 due to insulin drip therapy. Will check BMP and mag with am labs.   Allergies  Allergen Reactions  . Demerol [Meperidine] Nausea And Vomiting  . Reglan [Metoclopramide] Swelling  . Penicillins Swelling and Rash    Swelling to arm at injection site. Has patient had a PCN reaction causing immediate rash, facial/tongue/throat swelling, SOB or lightheadedness with hypotension: Yes Has patient had a PCN reaction causing severe rash involving mucus membranes or skin necrosis: No Has patient had a PCN reaction that required hospitalization No Has patient had a PCN reaction occurring within the last 10 years: No If all of the above answers are "NO", then may proceed with Cephalosporin use.     Patient Measurements: Height: 5\' 4"  (162.6 cm) Weight: 225 lb (102.1 kg) IBW/kg (Calculated) : 54.7   Vital Signs: Temp: 97.8 F (36.6 C) (09/22 0800) Temp Source: Oral (09/22 0800) BP: 122/77 (09/22 1000) Pulse Rate: 79 (09/22 1100) Intake/Output from previous day: 09/21 0701 - 09/22 0700 In: 150 [IV Piggyback:150] Out: 1851 [Urine:1850; Stool:1] Intake/Output from this shift: Total I/O In: 2347.1 [I.V.:2347.1] Out: 400 [Urine:400]  Labs:  Recent Labs  02/10/2016 1830 02/13/16 0438 02/13/16 0901  WBC 10.0 11.1*  --   HGB 14.8 15.7 14.7  HCT 44.1 47.7*  --   PLT 196 201  --   INR 1.00  --   --      Recent Labs  01/26/2016 1830 01/23/2016 1921 02/13/16 0438  NA 138  --  142  K 3.6  --  3.7  CL 104  --  108  CO2 20*  --  19*  GLUCOSE 446*  --  447*  BUN 20  --  20  CREATININE 1.13*  --  1.24*  CALCIUM 9.3  --  9.1  MG  --  1.4*  --   PROT 8.2*  --   --   ALBUMIN 4.0  --   --   AST 22  --   --   ALT 16  --   --   ALKPHOS 68  --   --    BILITOT 0.8  --   --    Estimated Creatinine Clearance: 43.5 mL/min (by C-G formula based on SCr of 1.24 mg/dL (H)).    Recent Labs  02/13/16 0940 02/13/16 1031 02/13/16 1126  GLUCAP 230* 204* 146*    Medications:  Scheduled:  . diltiazem  30 mg Oral Q6H  . metoprolol tartrate  50 mg Oral BID  . [START ON 02/16/2016] pantoprazole  40 mg Intravenous Q12H  . potassium chloride  10 mEq Intravenous Q1 Hr x 2  . sodium chloride flush  3 mL Intravenous Q12H   Infusions:  . sodium chloride 1,000 mL (02/13/16 0842)  . diltiazem (CARDIZEM) infusion Stopped (02/13/16 1129)  . insulin (NOVOLIN-R) infusion 6 Units/hr (02/13/16 1130)  . pantoprozole (PROTONIX) infusion 8 mg/hr (02/13/16 0911)    Horris LatinoHolly Aidynn Krenn, PharmD Pharmacy Resident 02/13/2016 11:46 AM

## 2016-02-13 NOTE — Progress Notes (Signed)
Inpatient Diabetes Program Recommendations  AACE/ADA: New Consensus Statement on Inpatient Glycemic Control (2015)  Target Ranges:  Prepandial:   less than 140 mg/dL      Peak postprandial:   less than 180 mg/dL (1-2 hours)      Critically ill patients:  140 - 180 mg/dL   Lab Results  Component Value Date   GLUCAP 286 (H) 02/13/2016   HGBA1C 12.8 (H) 07/05/2013    Review of Glycemic Control:  Results for [name], [name] (MRN [id number]) as of 02/13/2016 09:41  Ref. Range 02/13/2016 04:36 02/13/2016 05:46 02/13/2016 06:35 02/13/2016 07:33 02/13/2016 08:29  Glucose-Capillary Latest Ref Range: 65 - 99 mg/dL 782428 (H) 956391 (H) 213308 (H) 272 (H) 286 (H)    Diabetes history: Type 2 diabetes-A1C=9.8% 07/14/15  Outpatient Diabetes medications: Per [facility] note - Glucotrol 10 mg bid, Metformin 1000 mg bid on 07/14/15- patient was unable to tolerate Victoza  Current orders for Inpatient glycemic control:  Novolog sensitive q 6 hours IV insulin  Inpatient Diabetes Program Recommendations:    Consider adding Lantus 15 units daily 2 hours prior to d/c of insulin drip.  Also consider increasing Novolog correction to q 4 hours.  Text page sent to MD.  Thanks, [name] MeagerJenny Mechele Kittleson, RN, BC-ADM Inpatient Diabetes Coordinator Pager [phone] (8a-5p)

## 2016-02-13 NOTE — Progress Notes (Signed)
[facility] [place] [facility]   PATIENT NAME: [name]    MR#:  [id number]  DATE OF BIRTH:  11/06/1935  SUBJECTIVE:  Came in with diarrhea and bloody stools x2 and small amount of coffee ground emesis None since she came in Found to have elevated sugars and in Afib with RVR On protonix gtt,insulin gtt and cardizem gtt C/o epigastric pain  REVIEW OF SYSTEMS:   Review of Systems  Constitutional: Negative for chills, fever and weight loss.  HENT: Negative for ear discharge, ear pain and nosebleeds.   Eyes: Negative for blurred vision, pain and discharge.  Respiratory: Negative for sputum production, shortness of breath, wheezing and stridor.   Cardiovascular: Negative for chest pain, palpitations, orthopnea and PND.  Gastrointestinal: Negative for abdominal pain, diarrhea, nausea and vomiting.  Genitourinary: Negative for frequency and urgency.  Musculoskeletal: Negative for back pain and joint pain.  Neurological: Positive for weakness. Negative for sensory change, speech change and focal weakness.  Psychiatric/Behavioral: Negative for depression and hallucinations. The patient is not nervous/anxious.   All other systems reviewed and are negative.  Tolerating Diet:npo Tolerating PT: pending  DRUG ALLERGIES:   Allergies  Allergen Reactions  . Demerol [Meperidine] Nausea And Vomiting  . Reglan [Metoclopramide] Swelling  . Penicillins Swelling and Rash        VITALS:  Blood pressure (!) 148/77, pulse (!) 120, temperature 97.8 F (36.6 C), temperature source Oral, resp. rate (!) 28, height 5\' 4"  (1.626 m), weight 102.1 kg (225 lb), SpO2 97 %.  PHYSICAL EXAMINATION:   Physical Exam  GENERAL:  80 y.o.-year-old patient lying in the bed with no acute distress. obese EYES: Pupils equal, round, reactive to light and accommodation. No scleral icterus. Extraocular muscles intact.  HEENT: Head atraumatic, normocephalic. Oropharynx and  nasopharynx clear.  NECK:  Supple, no jugular venous distention. No thyroid enlargement, no tenderness.  LUNGS: Normal breath sounds bilaterally, no wheezing, rales, rhonchi. No use of accessory muscles of respiration.  CARDIOVASCULAR: S1, S2 normal. No murmurs, rubs, or gallops. Tachycardia/afib ABDOMEN: Soft, nontender, nondistended. Bowel sounds present. No organomegaly or mass.  EXTREMITIES: No cyanosis, clubbing or edema b/l.    NEUROLOGIC: Cranial nerves II through XII are intact. No focal Motor or sensory deficits b/l.   PSYCHIATRIC:  patient is alert and oriented x 3.  SKIN: No obvious rash, lesion, or ulcer.   LABORATORY PANEL:  CBC  Recent Labs Lab 02/13/16 0438  WBC 11.1*  HGB 15.7  HCT 47.7*  PLT 201    Chemistries   Recent Labs Lab 09/27/2015 1830 09/27/2015 1921 02/13/16 0438  NA 138  --  142  K 3.6  --  3.7  CL 104  --  108  CO2 20*  --  19*  GLUCOSE 446*  --  447*  BUN 20  --  20  CREATININE 1.13*  --  1.24*  CALCIUM 9.3  --  9.1  MG  --  1.4*  --   AST 22  --   --   ALT 16  --   --   ALKPHOS 68  --   --   BILITOT 0.8  --   --    Cardiac Enzymes  Recent Labs Lab 09/27/2015 1830  TROPONINI <0.03   RADIOLOGY:  Ct Abdomen Pelvis W Contrast  Result Date: 10-12-2015 CLINICAL DATA:  Acute onset of epigastric abdominal pain and nausea. Vomiting and diarrhea. Initial encounter. EXAM: CT ABDOMEN AND PELVIS WITH CONTRAST TECHNIQUE:  Multidetector CT imaging of the abdomen and pelvis was performed using the standard protocol following bolus administration of intravenous contrast. CONTRAST:  75mL ISOVUE-300 IOPAMIDOL (ISOVUE-300) INJECTION 61% COMPARISON:  PET/CT performed 07/09/2013 FINDINGS: Lower chest: Mild bibasilar atelectasis is noted. The previously noted right lower lobe masslike density has resolved. A small to moderate hiatal hernia is seen, with fluid filling the distal esophagus. Hepatobiliary: The liver is unremarkable in appearance. The gallbladder is  unremarkable in appearance. The common bile duct remains normal in caliber. Pancreas: The pancreas is within normal limits. Spleen: The spleen is unremarkable in appearance. A likely small calcified aneurysm is noted at the splenic hilum. Adrenals/Urinary Tract: The adrenal glands are unremarkable in appearance. Mild bilateral renal atrophy and scarring are noted, with underlying perinephric stranding. There is no evidence of hydronephrosis. No renal or ureteral stones are identified. Stomach/Bowel: The stomach is otherwise unremarkable in appearance. The small bowel is within normal limits. The appendix is normal in caliber, without evidence of appendicitis. Mild diverticulosis is noted along the descending and sigmoid colon, without evidence of diverticulitis. Vascular/Lymphatic: Scattered calcification is seen along the abdominal aorta and its branches. The abdominal aorta is otherwise grossly unremarkable. The inferior vena cava is grossly unremarkable. No retroperitoneal lymphadenopathy is seen. No pelvic sidewall lymphadenopathy is identified. Reproductive: The bladder is significantly distended and grossly unremarkable. The patient is status post hysterectomy. No suspicious adnexal masses are seen. Other: No additional soft tissue abnormalities are seen. Musculoskeletal: No acute osseous abnormalities are identified. Endplate sclerotic change is noted at L4-L5, with associated vacuum phenomenon along the lower lumbar spine. There is chronic loss of height at vertebral body T12. The visualized musculature is unremarkable in appearance. IMPRESSION: 1. No acute abnormality seen to explain the patient's symptoms. 2. Small to moderate hiatal hernia noted. Fluid fills the distal esophagus, likely reflecting mild esophageal dysmotility or gastroesophageal reflux. 3. Mild bibasilar atelectasis noted. 4. Aortic atherosclerosis. 5. Mild bilateral renal atrophy and scarring noted. 6. Mild diverticulosis along the  descending and sigmoid colon, without evidence of diverticulitis. 7. Chronic loss of height at vertebral body T12, and mild degenerative change at the lower lumbar spine. Electronically Signed   By: Roanna Raider M.D.   On: 03/03/2016 21:12   Dg Chest Portable 1 View  Result Date: 03-Mar-2016 CLINICAL DATA:  Initial evaluation for worsening shortness of breath. EXAM: PORTABLE CHEST 1 VIEW COMPARISON:  Prior radiograph from earlier the same day. FINDINGS: Stable cardiomegaly.  Mediastinal silhouette within normal limits. Lungs normally inflated. Overall pulmonary vascularity is not significantly changed without significant worsening edema. No pleural effusion. No focal infiltrates. No pneumothorax. Osseous structures unchanged. IMPRESSION: Stable cardiomegaly without radiographic evidence for significant edema. No other acute cardiopulmonary abnormality identified. Electronically Signed   By: Rise Mu M.D.   On: 2016/03/03 23:33   Dg Chest Port 1 View  Result Date: 03/03/2016 CLINICAL DATA:  80 y/o F; nausea vomiting and diarrhea with pain starting at approximately 12 p.m. today. EXAM: PORTABLE CHEST 1 VIEW COMPARISON:  07/12/2013 chest radiograph. FINDINGS: Stable enlarged cardiac silhouette given differences in technique. Aortic atherosclerosis with calcifications of the arch. No focal consolidation, pneumothorax, or pleural effusion. IMPRESSION: No active disease.  Stable cardiomegaly and aortic atherosclerosis. Electronically Signed   By: Mitzi Hansen M.D.   On: 03-03-16 19:26   ASSESSMENT AND PLAN:  [name]  is a 80 y.o. female who presents with Acute onset abdominal pain with coffee-ground emesis and hematochezia starting today just after lunch time.  In the ED her blood pressure stable, hemoglobin is stable, she has persistent abdominal pain and nausea. She is also in A. fib with RVR  1. Acute GI bleed - likely upper and lower both -poor historian Has had coffee  ground emesis and blood stools with  - NPO, protonix drip, IV fluids, monitor serial hemoglobin - GI consult NOT AVAILABLE TILL Monday. Pt and family aware that if pt has severe GI bleeding she will need to be transferred to [facility] -GI consult otherwise on Monday. -IV protonix gtt---changed to IV bid later    2.Atrial fibrillation with RVR (HCC) - cardizem drip, pain control PRN, IV fluids as above -po metoprolol and CCB -no anticoagulation due to GI bleed (pt was on eliquis in the past-she stopped it several months ago)  3.  HTN (hypertension) - continue home meds and cardizem drip as above  4.Uncontrolled  Diabetes (HCC)  Due to medication non complaince -was on glipizide,metformin and victoza (per [facility] out pt notes) -iV insulin gtt---transition to orals - SSI q6 hours   CM for d/c planning  Case discussed with Care Management/Social Worker. Management plans discussed with the patient, family and they are in agreement.  CODE STATUS: FULL  DVT Prophylaxis: SCD  TOTAL  Critical TIME TAKING CARE OF THIS PATIENT: 35 minutes.  >50% time spent on counselling and coordination of care [place]  POSSIBLE D/C IN 2-3DAYS, DEPENDING ON CLINICAL CONDITION.  Note: This dictation was prepared with Dragon dictation along with smaller phrase technology. Any transcriptional errors that result from this process are unintentional.  Efren Kross M.D on 02/13/2016 at 9:06 AM  Between 7am to 6pm - Pager - [phone]  After 6pm go to [email or web address] - password [email or web address] [facility]  [facility] [place]  Office  [phone]  CC: Primary care physician; No PCP Per Patient

## 2016-02-13 NOTE — Consult Note (Signed)
[facility] Cardiology Consultation Note  Patient ID: [name], MRN: [id number], DOB/AGE: June 08, 1935 80 y.o. Admit date: 2016-02-24   Date of Consult: 02/13/2016 Primary Physician: No PCP Per Patient Primary Cardiologist: Unknown  Chief Complaint:  Chief Complaint  Patient presents with  . Diarrhea  . Abdominal Pain  . Emesis   Reason for Consult: atrial fibrillation with rapid ventricular rate  HPI: 80 y.o. female with the chronic kidney disease stage III essential hypertension makes hyperlipidemia diabetes with complication having acute onset of abdominal discomfort hematemesis and now acute onset of atrial fibrillation paroxysmal nonvalvular in nature with rapid ventricular rate. The patient did have some improvements of hematemesis with appropriate medication management and is stable at this time. Hemodynamically she has had no evidence of significant hypotension. Heart rate is still rapid but now improved with the diltiazem intravenous drip with a heart rate of 101 bpm. Oral metoprolol has helped as well and she is functionally. There is no evidence of myocardial infarction with a normal troponin and no evidence of congestive heart failure at this time  Past Medical History:  Diagnosis Date  . Atrial fibrillation (HCC)   . Diabetes mellitus without complication (HCC)   . Hypertension       Surgical History:  Past Surgical History:  Procedure Laterality Date  . ABDOMINAL HYSTERECTOMY       Home Meds: Prior to Admission medications   Not on File    Inpatient Medications:  . diltiazem  30 mg Oral Q6H  . metoprolol tartrate  50 mg Oral BID  . [START ON 02/16/2016] pantoprazole  40 mg Intravenous Q12H  . sodium chloride flush  3 mL Intravenous Q12H   . sodium chloride 1,000 mL (02/13/16 0842)  . diltiazem (CARDIZEM) infusion Stopped (02/13/16 1129)  . insulin (NOVOLIN-R) infusion 1.7 Units/hr (02/13/16 1533)  . pantoprozole (PROTONIX) infusion 8 mg/hr (02/13/16  0911)    Allergies:  Allergies  Allergen Reactions  . Demerol [Meperidine] Nausea And Vomiting  . Reglan [Metoclopramide] Swelling  . Penicillins Swelling and Rash    Swelling to arm at injection site. Has patient had a PCN reaction causing immediate rash, facial/tongue/throat swelling, SOB or lightheadedness with hypotension: Yes Has patient had a PCN reaction causing severe rash involving mucus membranes or skin necrosis: No Has patient had a PCN reaction that required hospitalization No Has patient had a PCN reaction occurring within the last 10 years: No If all of the above answers are "NO", then may proceed with Cephalosporin use.     Social History   Social History  . Marital status: Divorced    Spouse name: N/A  . Number of children: N/A  . Years of education: N/A   Occupational History  . Not on file.   Social History Main Topics  . Smoking status: Never Smoker  . Smokeless tobacco: Never Used  . Alcohol use No  . Drug use: No  . Sexual activity: Not on file   Other Topics Concern  . Not on file   Social History Narrative  . No narrative on file     Family History  Problem Relation Age of Onset  . Diabetes Mother   . Kidney failure Mother   . Diabetes Father   . Kidney failure Father   . Stroke Father      Review of Systems Positive for Hematemesis Negative for: General:  chills, fever, night sweats or weight changes.  Cardiovascular: PND orthopnea syncope dizziness  Dermatological skin lesions  rashes Respiratory: Cough congestion Urologic: Frequent urination urination at night and hematuria Abdominal: negative for nausea, positive for vomiting, negative for diarrhea, bright red blood per rectum, melena, or positive for hematemesis Neurologic: negative for visual changes, and/or hearing changes  All other systems reviewed and are otherwise negative except as noted above.  Labs:  Recent Labs  March 03, 2016 1830  TROPONINI <0.03   Lab Results   Component Value Date   WBC 11.1 (H) 02/13/2016   HGB 14.7 02/13/2016   HCT 47.7 (H) 02/13/2016   MCV 93.0 02/13/2016   PLT 201 02/13/2016    Recent Labs Lab 03/03/16 1830 02/13/16 0438  NA 138 142  K 3.6 3.7  CL 104 108  CO2 20* 19*  BUN 20 20  CREATININE 1.13* 1.24*  CALCIUM 9.3 9.1  PROT 8.2*  --   BILITOT 0.8  --   ALKPHOS 68  --   ALT 16  --   AST 22  --   GLUCOSE 446* 447*   Lab Results  Component Value Date   CHOL 96 07/05/2013   HDL 21 (L) 07/05/2013   LDLCALC 42 07/05/2013   TRIG 165 07/05/2013   No results found for: DDIMER  Radiology/Studies:  Ct Abdomen Pelvis W Contrast  Result Date: 2016/03/03 CLINICAL DATA:  Acute onset of epigastric abdominal pain and nausea. Vomiting and diarrhea. Initial encounter. EXAM: CT ABDOMEN AND PELVIS WITH CONTRAST TECHNIQUE: Multidetector CT imaging of the abdomen and pelvis was performed using the standard protocol following bolus administration of intravenous contrast. CONTRAST:  75mL ISOVUE-300 IOPAMIDOL (ISOVUE-300) INJECTION 61% COMPARISON:  PET/CT performed 07/09/2013 FINDINGS: Lower chest: Mild bibasilar atelectasis is noted. The previously noted right lower lobe masslike density has resolved. A small to moderate hiatal hernia is seen, with fluid filling the distal esophagus. Hepatobiliary: The liver is unremarkable in appearance. The gallbladder is unremarkable in appearance. The common bile duct remains normal in caliber. Pancreas: The pancreas is within normal limits. Spleen: The spleen is unremarkable in appearance. A likely small calcified aneurysm is noted at the splenic hilum. Adrenals/Urinary Tract: The adrenal glands are unremarkable in appearance. Mild bilateral renal atrophy and scarring are noted, with underlying perinephric stranding. There is no evidence of hydronephrosis. No renal or ureteral stones are identified. Stomach/Bowel: The stomach is otherwise unremarkable in appearance. The small bowel is within normal  limits. The appendix is normal in caliber, without evidence of appendicitis. Mild diverticulosis is noted along the descending and sigmoid colon, without evidence of diverticulitis. Vascular/Lymphatic: Scattered calcification is seen along the abdominal aorta and its branches. The abdominal aorta is otherwise grossly unremarkable. The inferior vena cava is grossly unremarkable. No retroperitoneal lymphadenopathy is seen. No pelvic sidewall lymphadenopathy is identified. Reproductive: The bladder is significantly distended and grossly unremarkable. The patient is status post hysterectomy. No suspicious adnexal masses are seen. Other: No additional soft tissue abnormalities are seen. Musculoskeletal: No acute osseous abnormalities are identified. Endplate sclerotic change is noted at L4-L5, with associated vacuum phenomenon along the lower lumbar spine. There is chronic loss of height at vertebral body T12. The visualized musculature is unremarkable in appearance. IMPRESSION: 1. No acute abnormality seen to explain the patient's symptoms. 2. Small to moderate hiatal hernia noted. Fluid fills the distal esophagus, likely reflecting mild esophageal dysmotility or gastroesophageal reflux. 3. Mild bibasilar atelectasis noted. 4. Aortic atherosclerosis. 5. Mild bilateral renal atrophy and scarring noted. 6. Mild diverticulosis along the descending and sigmoid colon, without evidence of diverticulitis. 7. Chronic loss of height  at vertebral body T12, and mild degenerative change at the lower lumbar spine. Electronically Signed   By: Roanna RaiderJeffery  Chang M.D.   On: 13-Jan-2016 21:12   Dg Chest Portable 1 View  Result Date: 2015/06/28 CLINICAL DATA:  Initial evaluation for worsening shortness of breath. EXAM: PORTABLE CHEST 1 VIEW COMPARISON:  Prior radiograph from earlier the same day. FINDINGS: Stable cardiomegaly.  Mediastinal silhouette within normal limits. Lungs normally inflated. Overall pulmonary vascularity is not  significantly changed without significant worsening edema. No pleural effusion. No focal infiltrates. No pneumothorax. Osseous structures unchanged. IMPRESSION: Stable cardiomegaly without radiographic evidence for significant edema. No other acute cardiopulmonary abnormality identified. Electronically Signed   By: Rise MuBenjamin  McClintock M.D.   On: 13-Jan-2016 23:33   Dg Chest Port 1 View  Result Date: 2015/06/28 CLINICAL DATA:  80 y/o F; nausea vomiting and diarrhea with pain starting at approximately 12 p.m. today. EXAM: PORTABLE CHEST 1 VIEW COMPARISON:  07/12/2013 chest radiograph. FINDINGS: Stable enlarged cardiac silhouette given differences in technique. Aortic atherosclerosis with calcifications of the arch. No focal consolidation, pneumothorax, or pleural effusion. IMPRESSION: No active disease.  Stable cardiomegaly and aortic atherosclerosis. Electronically Signed   By: Mitzi HansenLance  Furusawa-Stratton M.D.   On: 13-Jan-2016 19:26    EKG: Atrial fibrillation with rapid ventricular rate  Weights: Filed Weights   01/09/16 1811  Weight: 225 lb (102.1 kg)     Physical Exam: Blood pressure 113/67, pulse 96, temperature 97.8 F (36.6 C), temperature source Oral, resp. rate (!) 26, height 5\' 4"  (1.626 m), weight 225 lb (102.1 kg), SpO2 97 %. Body mass index is 38.62 kg/m. General: Well developed, well nourished, in no acute distress. Head eyes ears nose throat: Normocephalic, atraumatic, sclera non-icteric, no xanthomas, nares are without discharge. No apparent thyromegaly and/or mass  Lungs: Normal respiratory effort.  no wheezes, no rales, no rhonchi.  Heart: Irregular with normal S1 S2. no murmur gallop, no rub, PMI is normal size and placement, carotid upstroke normal without bruit, jugular venous pressure is normal Abdomen: Soft, -tender, non-distended with normoactive bowel sounds. No hepatomegaly. No rebound/guarding. No obvious abdominal masses. Abdominal aorta is normal size without  bruit Extremities: No edema. no cyanosis, no clubbing, no ulcers  Peripheral : 2+ bilateral upper extremity pulses, 2+ bilateral femoral pulses, 2+ bilateral dorsal pedal pulse Neuro: Alert and oriented. No facial asymmetry. No focal deficit. Moves all extremities spontaneously. Musculoskeletal: Normal muscle tone without kyphosis Psych:  Responds to questions appropriately with a normal affect.    Assessment: [id number] year old female with essential hypertension mixed hyperlipidemia chronic kidney disease stage III with the GI bleed and hematemesis causing atrial fibrillation with rapid ventricular rate now more controlled on appropriate medication management without evidence of heart failure or myocardial infarction  Plan: 1. Continue diltiazem drip for heart rate control while recovering from hematemesis and GI bleed 2. Continue metoprolol at 50 mg and would potentially increase tomorrow orally if patient is improved with titration down of the diltiazem drip 3. No use of anticoagulation at this time due to bleeding 4. Echocardiogram for LV systolic dysfunction valvular heart disease and adjustments of medications as necessary 5. Begin ambulation and further adjustments of medications  Signed, Lamar BlinksKOWALSKI,BRUCE J M.D. [facility] 02/13/2016, 3:47 PM

## 2016-02-13 NOTE — Progress Notes (Signed)
Spoke briefly with patient and Charity fundraiserN.  Patient states that she stopped taking her diabetes medications, because there was just too much going on.  She has PCP.  Currently she remains on insulin drip and is NPO. Per RN, orders were switched to ICU glycemic control order set.  Reviewed transition criteria.  May need the addition of dextrose in IV fluids since patient is NPO?  She currently has had 3 blood sugars less than 180 mg/dL.    Thanks, Beryl MeagerJenny Makalya Nave, RN, BC-ADM Inpatient Diabetes Coordinator Pager [phone] (8a-5p)

## 2016-02-14 ENCOUNTER — Inpatient Hospital Stay: Payer: Commercial Managed Care - HMO

## 2016-02-14 ENCOUNTER — Inpatient Hospital Stay (HOSPITAL_COMMUNITY)
Admit: 2016-02-14 | Discharge: 2016-02-14 | Disposition: A | Discharge status: Home / Self Care | Payer: Commercial Managed Care - HMO | Attending: Internal Medicine | Admitting: Internal Medicine

## 2016-02-14 DIAGNOSIS — R0600 Dyspnea, unspecified: Secondary | ICD-10-CM

## 2016-02-14 LAB — GLUCOSE, CAPILLARY
GLUCOSE-CAPILLARY: 213 mg/dL — AB (ref 65–99)
GLUCOSE-CAPILLARY: 243 mg/dL — AB (ref 65–99)
GLUCOSE-CAPILLARY: 146 mg/dL — AB (ref 65–99)
GLUCOSE-CAPILLARY: 141 mg/dL — AB (ref 65–99)
GLUCOSE-CAPILLARY: 140 mg/dL — AB (ref 65–99)
Glucose-Capillary: 223 mg/dL — ABNORMAL HIGH (ref 65–99)

## 2016-02-14 LAB — HEMOGLOBIN
HEMOGLOBIN: 12.8 g/dL (ref 12.0–16.0)
Hemoglobin: 13.1 g/dL (ref 12.0–16.0)
Hemoglobin: 12.7 g/dL (ref 12.0–16.0)

## 2016-02-14 LAB — URINALYSIS COMPLETE WITH MICROSCOPIC (ARMC ONLY)
BACTERIA UA: NONE SEEN
Bilirubin Urine: NEGATIVE
LEUKOCYTES UA: NEGATIVE
Nitrite: NEGATIVE
PH: 5 (ref 5.0–8.0)
PROTEIN: 100 mg/dL — AB
Specific Gravity, Urine: 1.016 (ref 1.005–1.030)

## 2016-02-14 LAB — CBC WITH DIFFERENTIAL/PLATELET
BASOS ABS: 0 10*3/uL (ref 0–0.1)
Basophils Relative: 0 %
Eosinophils Absolute: 0 10*3/uL (ref 0–0.7)
Eosinophils Relative: 0 %
HEMATOCRIT: 39.1 % (ref 35.0–47.0)
HEMOGLOBIN: 12.9 g/dL (ref 12.0–16.0)
LYMPHS ABS: 1.4 10*3/uL (ref 1.0–3.6)
Lymphocytes Relative: 13 %
MCH: 30.6 pg (ref 26.0–34.0)
MCHC: 32.9 g/dL (ref 32.0–36.0)
MCV: 92.9 fL (ref 80.0–100.0)
MONO ABS: 0.7 10*3/uL (ref 0.2–0.9)
MONOS PCT: 7 %
NEUTROS PCT: 80 %
Neutro Abs: 8.9 10*3/uL — ABNORMAL HIGH (ref 1.4–6.5)
Platelets: 169 10*3/uL (ref 150–440)
RBC: 4.2 MIL/uL (ref 3.80–5.20)
RDW: 14.7 % — AB (ref 11.5–14.5)
WBC: 11.1 10*3/uL — AB (ref 3.6–11.0)

## 2016-02-14 LAB — BASIC METABOLIC PANEL
ANION GAP: 6 (ref 5–15)
BUN: 37 mg/dL — ABNORMAL HIGH (ref 6–20)
CALCIUM: 7.8 mg/dL — AB (ref 8.9–10.3)
CO2: 20 mmol/L — AB (ref 22–32)
CREATININE: 1.4 mg/dL — AB (ref 0.44–1.00)
Chloride: 114 mmol/L — ABNORMAL HIGH (ref 101–111)
GFR, EST AFRICAN AMERICAN: 41 mL/min — AB (ref >60)
GFR, EST NON AFRICAN AMERICAN: 35 mL/min — AB (ref >60)
Glucose, Bld: 226 mg/dL — ABNORMAL HIGH (ref 65–99)
Potassium: 4.3 mmol/L (ref 3.5–5.1)
Sodium: 140 mmol/L (ref 135–145)

## 2016-02-14 LAB — ECHOCARDIOGRAM COMPLETE
Height: 64 in
WEIGHTICAEL: 3600 [oz_av]

## 2016-02-14 LAB — AMMONIA

## 2016-02-14 LAB — MAGNESIUM: Magnesium: 2 mg/dL (ref 1.7–2.4)

## 2016-02-14 LAB — LACTIC ACID, PLASMA
LACTIC ACID, VENOUS: 2 mmol/L — AB (ref 0.5–1.9)
Lactic Acid, Venous: 2 mmol/L (ref 0.5–1.9)

## 2016-02-14 MED ORDER — DILTIAZEM HCL 100 MG IV SOLR
5.0000 mg/h | INTRAVENOUS | Status: DC
  Administered 2016-02-14: 5 mg/h via INTRAVENOUS
  Filled 2016-02-14 (×2): qty 100

## 2016-02-14 MED ORDER — INSULIN ASPART 100 UNIT/ML ~~LOC~~ SOLN
0.0000 [IU] | Freq: Every day | SUBCUTANEOUS | Status: DC
  Administered 2016-02-16: 2 [IU] via SUBCUTANEOUS
  Administered 2016-02-17: 3 [IU] via SUBCUTANEOUS
  Filled 2016-02-14: qty 2
  Filled 2016-02-14: qty 3

## 2016-02-14 MED ORDER — VANCOMYCIN HCL 10 G IV SOLR
1500.0000 mg | Freq: Once | INTRAVENOUS | Status: AC
  Administered 2016-02-14: 1500 mg via INTRAVENOUS
  Filled 2016-02-14: qty 1500

## 2016-02-14 MED ORDER — INSULIN GLARGINE 100 UNIT/ML ~~LOC~~ SOLN
18.0000 [IU] | SUBCUTANEOUS | Status: DC
  Filled 2016-02-14: qty 0.18

## 2016-02-14 MED ORDER — VANCOMYCIN HCL 10 G IV SOLR
1500.0000 mg | INTRAVENOUS | Status: DC
  Administered 2016-02-15: 1500 mg via INTRAVENOUS
  Filled 2016-02-14: qty 1500

## 2016-02-14 MED ORDER — INSULIN ASPART 100 UNIT/ML ~~LOC~~ SOLN
0.0000 [IU] | Freq: Three times a day (TID) | SUBCUTANEOUS | Status: DC
  Administered 2016-02-14: 1 [IU] via SUBCUTANEOUS
  Administered 2016-02-15: 3 [IU] via SUBCUTANEOUS
  Administered 2016-02-15: 2 [IU] via SUBCUTANEOUS
  Administered 2016-02-15: 3 [IU] via SUBCUTANEOUS
  Administered 2016-02-16 (×2): 2 [IU] via SUBCUTANEOUS
  Administered 2016-02-16: 3 [IU] via SUBCUTANEOUS
  Administered 2016-02-17 (×2): 5 [IU] via SUBCUTANEOUS
  Administered 2016-02-17 – 2016-02-18 (×2): 7 [IU] via SUBCUTANEOUS
  Administered 2016-02-18: 9 [IU] via SUBCUTANEOUS
  Filled 2016-02-14: qty 2
  Filled 2016-02-14: qty 3
  Filled 2016-02-14: qty 5
  Filled 2016-02-14: qty 9
  Filled 2016-02-14: qty 5
  Filled 2016-02-14: qty 1
  Filled 2016-02-14 (×2): qty 3
  Filled 2016-02-14: qty 7
  Filled 2016-02-14: qty 2
  Filled 2016-02-14: qty 7
  Filled 2016-02-14: qty 2

## 2016-02-14 MED ORDER — PIPERACILLIN-TAZOBACTAM 3.375 G IVPB
3.3750 g | Freq: Three times a day (TID) | INTRAVENOUS | Status: DC
  Administered 2016-02-14 – 2016-02-16 (×5): 3.375 g via INTRAVENOUS
  Filled 2016-02-14 (×5): qty 50

## 2016-02-14 MED ORDER — SODIUM CHLORIDE 0.9 % IV SOLN
INTRAVENOUS | Status: DC
  Administered 2016-02-14 – 2016-02-15 (×2): via INTRAVENOUS

## 2016-02-14 NOTE — Progress Notes (Signed)
1743 Admitted to ICU with mentation changes  from 2A. After CT of head done. Recognizes family but does not know name .bithday address or date. Abdomen tender to touch.

## 2016-02-14 NOTE — Progress Notes (Signed)
CH responded to Page to room 245. Pt was being taken for a CT scan due to sudden changes. Family was confused and surprised. Pt was taken to IC 18 for closer monitoring. CH escorted family to IC waiting room and provided the ministry of prayer and presence. CH is available for follow up as needed.    02/14/16 1800  Clinical Encounter Type  Visited With Family;Patient not available  Visit Type Initial;Spiritual support;Critical Care  Referral From Nurse  Spiritual Encounters  Spiritual Needs Prayer;Emotional  Stress Factors  Patient Stress Factors Health changes;Major life changes  Family Stress Factors Health changes;Lack of knowledge

## 2016-02-14 NOTE — Progress Notes (Signed)
*  PRELIMINARY RESULTS* Echocardiogram 2D Echocardiogram has been performed.  [name] 02/14/2016, 10:34 AM

## 2016-02-14 NOTE — Significant Event (Signed)
Rapid Response Event Note  Overview: Time Called: 1652 Arrival Time: 1653 Event Type: Neurologic  Initial Focused Assessment: called for neurologic changes in room 245, pt awake, laying in bed, vss, unable to speak on move extremities ( this is new event according to 2A staff)   Interventions:spoke with DR Wiliam KeKalesetti, stat head ct and tx to ICU stepdown for closer monitoring and assessment. Plan of Care (if not transferred):  Event Summary: stat head ct and tx to icu Name of Physician Notified: Kalesetti at 1700    at    Outcome: Transferred (Comment)  Event End Time: 1710  Taniaya Rudder A

## 2016-02-14 NOTE — Progress Notes (Signed)
Report given to Myra in ICU. All questions answered

## 2016-02-14 NOTE — Progress Notes (Signed)
[facility] [place] [facility]  Patient: [name] CarrelBobbie L Beck / Admit Date: Sep 26, 2015 / Date of Encounter: 02/14/2016, 6:41 AM   Subjective: Patient with no further evidence of GI bleeding. Hemodynamically stable on appropriate medication management with reasonable heart rate control of atrial fibrillation after episode of atrial fibrillation with rapid ventricular rate and weaning of diltiazem drip. Rate is 100 bpm  Review of Systems: Positive for: Abdominal discomfort tachycardia Negative for: Vision change, hearing change, syncope, dizziness, nausea, vomiting,diarrhea, bloody stool, stomach pain, cough, congestion, diaphoresis, urinary frequency, urinary pain,skin lesions, skin rashes Others previously listed  Objective: Telemetry: Atrial fibrillation with controlled ventricular rate Physical Exam: Blood pressure 112/69, pulse 100, temperature 98.1 F (36.7 C), temperature source Oral, resp. rate 18, height 5\' 4"  (1.626 m), weight 225 lb (102.1 kg), SpO2 98 %. Body mass index is 38.62 kg/m. General: Well developed, well nourished, in no acute distress. Head: Normocephalic, atraumatic, sclera non-icteric, no xanthomas, nares are without discharge. Neck: No apparent masses Lungs: Normal respirations with no wheezes, no rhonchi, no rales , no crackles   Heart: Irregular rate and rhythm, normal S1 S2, no murmur, no rub, no gallop, PMI is normal size and placement, carotid upstroke normal without bruit, jugular venous pressure normal Abdomen: Soft,  tender, non-distended with normoactive bowel sounds. No hepatosplenomegaly. Abdominal aorta is normal size without bruit Extremities: No edema, no clubbing, no cyanosis, no ulcers,  Peripheral: 2+ radial, 2+ femoral, 2+ dorsal pedal pulses Neuro: Alert and oriented. Moves all extremities spontaneously. Psych:  Responds to questions appropriately with a normal affect.   Intake/Output Summary (Last 24 hours) at 02/14/16 0641 Last  data filed at 02/14/16 0412  Gross per 24 hour  Intake          4092.05 ml  Output              900 ml  Net          3192.05 ml    Inpatient Medications:  . diltiazem  30 mg Oral Q6H  . insulin aspart  2-6 Units Subcutaneous Q4H  . insulin glargine  10 Units Subcutaneous Q24H  . metoprolol tartrate  50 mg Oral BID  . pantoprazole  40 mg Intravenous Q12H  . sodium chloride flush  3 mL Intravenous Q12H   Infusions:  . sodium chloride 1,000 mL (02/13/16 1729)    Labs:  Recent Labs  2015/06/09 1921 02/13/16 0438 02/14/16 0055  NA  --  142 140  K  --  3.7 4.3  CL  --  108 114*  CO2  --  19* 20*  GLUCOSE  --  447* 226*  BUN  --  20 37*  CREATININE  --  1.24* 1.40*  CALCIUM  --  9.1 7.8*  MG 1.4*  --  2.0    Recent Labs  2015/06/09 1830  AST 22  ALT 16  ALKPHOS 68  BILITOT 0.8  PROT 8.2*  ALBUMIN 4.0    Recent Labs  2015/06/09 1830 02/13/16 0438  02/13/16 1709 02/14/16 0055  WBC 10.0 11.1*  --   --   --   NEUTROABS 7.7*  --   --   --   --   HGB 14.8 15.7  < > 13.4 13.1  HCT 44.1 47.7*  --   --   --   MCV 91.8 93.0  --   --   --   PLT 196 201  --   --   --   < > =  values in this interval not displayed.  Recent Labs  February 29, 2016 1830  TROPONINI <0.03   Invalid input(s): POCBNP No results for input(s): HGBA1C in the last 72 hours.   Weights: Filed Weights   03/07/2016 1811  Weight: 225 lb (102.1 kg)     Radiology/Studies:  Ct Abdomen Pelvis W Contrast  Result Date: 03/02/2016 CLINICAL DATA:  Acute onset of epigastric abdominal pain and nausea. Vomiting and diarrhea. Initial encounter. EXAM: CT ABDOMEN AND PELVIS WITH CONTRAST TECHNIQUE: Multidetector CT imaging of the abdomen and pelvis was performed using the standard protocol following bolus administration of intravenous contrast. CONTRAST:  75mL ISOVUE-300 IOPAMIDOL (ISOVUE-300) INJECTION 61% COMPARISON:  PET/CT performed 07/09/2013 FINDINGS: Lower chest: Mild bibasilar atelectasis is noted. The  previously noted right lower lobe masslike density has resolved. A small to moderate hiatal hernia is seen, with fluid filling the distal esophagus. Hepatobiliary: The liver is unremarkable in appearance. The gallbladder is unremarkable in appearance. The common bile duct remains normal in caliber. Pancreas: The pancreas is within normal limits. Spleen: The spleen is unremarkable in appearance. A likely small calcified aneurysm is noted at the splenic hilum. Adrenals/Urinary Tract: The adrenal glands are unremarkable in appearance. Mild bilateral renal atrophy and scarring are noted, with underlying perinephric stranding. There is no evidence of hydronephrosis. No renal or ureteral stones are identified. Stomach/Bowel: The stomach is otherwise unremarkable in appearance. The small bowel is within normal limits. The appendix is normal in caliber, without evidence of appendicitis. Mild diverticulosis is noted along the descending and sigmoid colon, without evidence of diverticulitis. Vascular/Lymphatic: Scattered calcification is seen along the abdominal aorta and its branches. The abdominal aorta is otherwise grossly unremarkable. The inferior vena cava is grossly unremarkable. No retroperitoneal lymphadenopathy is seen. No pelvic sidewall lymphadenopathy is identified. Reproductive: The bladder is significantly distended and grossly unremarkable. The patient is status post hysterectomy. No suspicious adnexal masses are seen. Other: No additional soft tissue abnormalities are seen. Musculoskeletal: No acute osseous abnormalities are identified. Endplate sclerotic change is noted at L4-L5, with associated vacuum phenomenon along the lower lumbar spine. There is chronic loss of height at vertebral body T12. The visualized musculature is unremarkable in appearance. IMPRESSION: 1. No acute abnormality seen to explain the patient's symptoms. 2. Small to moderate hiatal hernia noted. Fluid fills the distal esophagus,  likely reflecting mild esophageal dysmotility or gastroesophageal reflux. 3. Mild bibasilar atelectasis noted. 4. Aortic atherosclerosis. 5. Mild bilateral renal atrophy and scarring noted. 6. Mild diverticulosis along the descending and sigmoid colon, without evidence of diverticulitis. 7. Chronic loss of height at vertebral body T12, and mild degenerative change at the lower lumbar spine. Electronically Signed   By: Roanna Raider M.D.   On: 02/24/2016 21:12   Dg Chest Portable 1 View  Result Date: 02/23/2016 CLINICAL DATA:  Initial evaluation for worsening shortness of breath. EXAM: PORTABLE CHEST 1 VIEW COMPARISON:  Prior radiograph from earlier the same day. FINDINGS: Stable cardiomegaly.  Mediastinal silhouette within normal limits. Lungs normally inflated. Overall pulmonary vascularity is not significantly changed without significant worsening edema. No pleural effusion. No focal infiltrates. No pneumothorax. Osseous structures unchanged. IMPRESSION: Stable cardiomegaly without radiographic evidence for significant edema. No other acute cardiopulmonary abnormality identified. Electronically Signed   By: Rise Mu M.D.   On: 02/26/2016 23:33   Dg Chest Port 1 View  Result Date: 29-Feb-2016 CLINICAL DATA:  80 y/o F; nausea vomiting and diarrhea with pain starting at approximately 12 p.m. today. EXAM: PORTABLE CHEST 1 VIEW  COMPARISON:  07/12/2013 chest radiograph. FINDINGS: Stable enlarged cardiac silhouette given differences in technique. Aortic atherosclerosis with calcifications of the arch. No focal consolidation, pneumothorax, or pleural effusion. IMPRESSION: No active disease.  Stable cardiomegaly and aortic atherosclerosis. Electronically Signed   By: Mitzi Hansen M.D.   On: 02/13/2016 19:26     Assessment and Recommendation  80 y.o. female with the known paroxysmal nonvalvular atrial fibrillation with recent GI bleed now with cessation of bleeding and improved but having  episode of atrial fibrillation with rapid ventricular rate now responding to metoprolol and additional diltiazem  1. Continue metoprolol at 50 mg twice per day and with additional oral diltiazem for heart rate control and possible spontaneous conversion to normal rhythm 2. Avoid anticoagulation at this time due to concerns of episodes of bleeding 3. Begin ambulation and following for improvements of symptoms of tachycardia 4. Further diagnostics as testing and treatment options after above  Signed, Arnoldo Hooker M.D. FACC

## 2016-02-14 NOTE — Progress Notes (Signed)
Pharmacy Antibiotic Note  [name] CarrelBobbie L Beck is a 80 y.o. female admitted on 02/13/2016 with sepsis.  Pharmacy has been consulted for Zosyn and vancomycin dosing.  Plan: 1. Zosyn 3.375 gm IV Q8H EI 2. Vancomycin 1.5 gm IV x 1 followed in approximately 10 hours (stacked dosing) by vancomycin 1.5 gm IV Q24H, predicted trough 15 mcg/mL. Pharmacy will continue to follow and adjust as needed to maintain trough 15 to 20 mcg/mL.   Vd 71.5 L, Ke 0.036 hr-1, T1/2 19.1 hr  Height: 5\' 4"  (162.6 cm) Weight: 225 lb (102.1 kg) IBW/kg (Calculated) : 54.7  Temp (24hrs), Avg:98.7 F (37.1 C), Min:97.4 F (36.3 C), Max:101.5 F (38.6 C)   Recent Labs Lab 02/01/2016 1830 01/31/2016 1920 02/13/16 0438 02/14/16 0055 02/14/16 1755  WBC 10.0  --  11.1*  --   --   CREATININE 1.13*  --  1.24* 1.40*  --   LATICACIDVEN  --  2.0*  --   --  2.0*    Estimated Creatinine Clearance: 38.5 mL/min (by C-G formula based on SCr of 1.4 mg/dL (H)).    Allergies  Allergen Reactions  . Demerol [Meperidine] Nausea And Vomiting  . Reglan [Metoclopramide] Swelling  . Penicillins Swelling and Rash    Swelling to arm at injection site. Has patient had a PCN reaction causing immediate rash, facial/tongue/throat swelling, SOB or lightheadedness with hypotension: Yes Has patient had a PCN reaction causing severe rash involving mucus membranes or skin necrosis: No Has patient had a PCN reaction that required hospitalization No Has patient had a PCN reaction occurring within the last 10 years: No If all of the above answers are "NO", then may proceed with Cephalosporin use.     Thank you for allowing pharmacy to be a part of this patient's care.  Carola FrostNathan A Abass Misener, Pharm.D., BCPS Clinical Pharmacist  02/14/2016 6:46 PM

## 2016-02-14 NOTE — Progress Notes (Signed)
PHARMACY CONSULT NOTE  Pharmacy consulted to assist in electroylyte management in this 34 yoF admitted on 9/21 with GI bleed. Currently on insulin drip.  1. Electrolytes:  9/22 am labs K= 3.7; no magnesium level. Pt will receive K 10 mEq x 2 due to insulin drip therapy. Will check BMP and mag with am labs.   9/23: K=4.3  Mag=2.0. Patient no longer on Insulin drip. (Lantus, SSI). No supplementation needed currently. Will f/u with am labs.     Allergies  Allergen Reactions  . Demerol [Meperidine] Nausea And Vomiting  . Reglan [Metoclopramide] Swelling  . Penicillins Swelling and Rash    Swelling to arm at injection site. Has patient had a PCN reaction causing immediate rash, facial/tongue/throat swelling, SOB or lightheadedness with hypotension: Yes Has patient had a PCN reaction causing severe rash involving mucus membranes or skin necrosis: No Has patient had a PCN reaction that required hospitalization No Has patient had a PCN reaction occurring within the last 10 years: No If all of the above answers are "NO", then may proceed with Cephalosporin use.     Patient Measurements: Height: 5\' 4"  (162.6 cm) Weight: 225 lb (102.1 kg) IBW/kg (Calculated) : 54.7   Vital Signs: Temp: 98.1 F (36.7 C) (09/23 0412) Temp Source: Oral (09/23 0412) BP: 112/69 (09/23 0412) Pulse Rate: 108 (09/23 0910) Intake/Output from previous day: 09/22 0701 - 09/23 0700 In: 4092.1 [I.V.:3742.1; IV Piggyback:350] Out: 900 [Urine:900] Intake/Output from this shift: Total I/O In: 120 [P.O.:120] Out: 0   Labs:  Recent Labs  2016/03/07 1830 02/13/16 0438  02/13/16 1709 02/14/16 0055 02/14/16 0905  WBC 10.0 11.1*  --   --   --   --   HGB 14.8 15.7  < > 13.4 13.1 12.7  HCT 44.1 47.7*  --   --   --   --   PLT 196 201  --   --   --   --   INR 1.00  --   --   --   --   --   < > = values in this interval not displayed.   Recent Labs  03/07/2016 1830 03/07/16 1921 02/13/16 0438  02/14/16 0055  NA 138  --  142 140  K 3.6  --  3.7 4.3  CL 104  --  108 114*  CO2 20*  --  19* 20*  GLUCOSE 446*  --  447* 226*  BUN 20  --  20 37*  CREATININE 1.13*  --  1.24* 1.40*  CALCIUM 9.3  --  9.1 7.8*  MG  --  1.4*  --  2.0  PROT 8.2*  --   --   --   ALBUMIN 4.0  --   --   --   AST 22  --   --   --   ALT 16  --   --   --   ALKPHOS 68  --   --   --   BILITOT 0.8  --   --   --    Estimated Creatinine Clearance: 38.5 mL/min (by C-G formula based on SCr of 1.4 mg/dL (H)).    Recent Labs  02/13/16 2040 02/14/16 0413 02/14/16 0718  GLUCAP 165* 223* 213*    Medications:  Scheduled:  . diltiazem  30 mg Oral Q6H  . insulin aspart  2-6 Units Subcutaneous Q4H  . insulin glargine  18 Units Subcutaneous Q24H  . metoprolol tartrate  50 mg Oral BID  .  pantoprazole  40 mg Intravenous Q12H  . sodium chloride flush  3 mL Intravenous Q12H   Infusions:     Bari MantisKristin Hoover Grewe PharmD Clinical Pharmacist 02/14/2016

## 2016-02-14 NOTE — Progress Notes (Signed)
[employer] [facility] - [place] [facility]   PATIENT NAME: [name]    MR#:  [id number]  DATE OF BIRTH:  06/06/1935  SUBJECTIVE:  CHIEF COMPLAINT:   Chief Complaint  Patient presents with  . Diarrhea  . Abdominal Pain  . Emesis   - sugars are better today, HR improving - appears very short winded  REVIEW OF SYSTEMS:  Review of Systems  Constitutional: Positive for malaise/fatigue. Negative for chills and fever.  HENT: Negative for ear discharge, ear pain and tinnitus.   Eyes: Negative for blurred vision and double vision.  Respiratory: Positive for shortness of breath. Negative for cough and wheezing.   Cardiovascular: Negative for chest pain, palpitations and leg swelling.  Gastrointestinal: Negative for abdominal pain, constipation, diarrhea, nausea and vomiting.  Genitourinary: Negative for dysuria and urgency.  Musculoskeletal: Negative for myalgias.  Neurological: Negative for dizziness, sensory change, speech change, focal weakness, seizures and headaches.  Psychiatric/Behavioral: Negative for depression.    DRUG ALLERGIES:   Allergies  Allergen Reactions  . Demerol [Meperidine] Nausea And Vomiting  . Reglan [Metoclopramide] Swelling  . Penicillins Swelling and Rash    Swelling to arm at injection site. Has patient had a PCN reaction causing immediate rash, facial/tongue/throat swelling, SOB or lightheadedness with hypotension: Yes Has patient had a PCN reaction causing severe rash involving mucus membranes or skin necrosis: No Has patient had a PCN reaction that required hospitalization No Has patient had a PCN reaction occurring within the last 10 years: No If all of the above answers are "NO", then may proceed with Cephalosporin use.     VITALS:  Blood pressure (!) 140/95, pulse 71, temperature 98.3 F (36.8 C), temperature source Oral, resp. rate 20, height 5\' 4"  (1.626 m), weight 102.1 kg (225 lb), SpO2 99 %.  PHYSICAL EXAMINATION:    Physical Exam  GENERAL:  80 y.o.-year-old patient lying in the bed with no acute distress. Appears dyspneic at rest. EYES: Pupils equal, round, reactive to light and accommodation. No scleral icterus. Extraocular muscles intact.  HEENT: Head atraumatic, normocephalic. Oropharynx and nasopharynx clear.  NECK:  Supple, no jugular venous distention. No thyroid enlargement, no tenderness.  LUNGS: Normal breath sounds bilaterally, no wheezing, rales,rhonchi or crepitation. No use of accessory muscles of respiration. Decreased bibasilar breath sounds CARDIOVASCULAR: S1, S2 normal. No murmurs, rubs, or gallops.  ABDOMEN: Soft, obese, nontender, nondistended. Bowel sounds present. No organomegaly or mass.  EXTREMITIES: No pedal edema, cyanosis, or clubbing.  NEUROLOGIC: Cranial nerves II through XII are intact. Muscle strength 5/5 in all extremities. Sensation intact. Gait not checked.  PSYCHIATRIC: The patient is alert and oriented x 3.  SKIN: No obvious rash, lesion, or ulcer.    LABORATORY PANEL:   CBC  Recent Labs Lab 02/13/16 0438  02/14/16 0905  WBC 11.1*  --   --   HGB 15.7  < > 12.7  HCT 47.7*  --   --   PLT 201  --   --   < > = values in this interval not displayed. ------------------------------------------------------------------------------------------------------------------  Chemistries   Recent Labs Lab 19-Jun-2015 1830  02/14/16 0055  NA 138  < > 140  K 3.6  < > 4.3  CL 104  < > 114*  CO2 20*  < > 20*  GLUCOSE 446*  < > 226*  BUN 20  < > 37*  CREATININE 1.13*  < > 1.40*  CALCIUM 9.3  < > 7.8*  MG  --   < >  2.0  AST 22  --   --   ALT 16  --   --   ALKPHOS 68  --   --   BILITOT 0.8  --   --   < > = values in this interval not displayed. ------------------------------------------------------------------------------------------------------------------  Cardiac Enzymes  Recent Labs Lab 2016/02/13 1830  TROPONINI <0.03    ------------------------------------------------------------------------------------------------------------------  RADIOLOGY:  Ct Abdomen Pelvis W Contrast  Result Date: 02-13-16 CLINICAL DATA:  Acute onset of epigastric abdominal pain and nausea. Vomiting and diarrhea. Initial encounter. EXAM: CT ABDOMEN AND PELVIS WITH CONTRAST TECHNIQUE: Multidetector CT imaging of the abdomen and pelvis was performed using the standard protocol following bolus administration of intravenous contrast. CONTRAST:  75mL ISOVUE-300 IOPAMIDOL (ISOVUE-300) INJECTION 61% COMPARISON:  PET/CT performed 07/09/2013 FINDINGS: Lower chest: Mild bibasilar atelectasis is noted. The previously noted right lower lobe masslike density has resolved. A small to moderate hiatal hernia is seen, with fluid filling the distal esophagus. Hepatobiliary: The liver is unremarkable in appearance. The gallbladder is unremarkable in appearance. The common bile duct remains normal in caliber. Pancreas: The pancreas is within normal limits. Spleen: The spleen is unremarkable in appearance. A likely small calcified aneurysm is noted at the splenic hilum. Adrenals/Urinary Tract: The adrenal glands are unremarkable in appearance. Mild bilateral renal atrophy and scarring are noted, with underlying perinephric stranding. There is no evidence of hydronephrosis. No renal or ureteral stones are identified. Stomach/Bowel: The stomach is otherwise unremarkable in appearance. The small bowel is within normal limits. The appendix is normal in caliber, without evidence of appendicitis. Mild diverticulosis is noted along the descending and sigmoid colon, without evidence of diverticulitis. Vascular/Lymphatic: Scattered calcification is seen along the abdominal aorta and its branches. The abdominal aorta is otherwise grossly unremarkable. The inferior vena cava is grossly unremarkable. No retroperitoneal lymphadenopathy is seen. No pelvic sidewall lymphadenopathy  is identified. Reproductive: The bladder is significantly distended and grossly unremarkable. The patient is status post hysterectomy. No suspicious adnexal masses are seen. Other: No additional soft tissue abnormalities are seen. Musculoskeletal: No acute osseous abnormalities are identified. Endplate sclerotic change is noted at L4-L5, with associated vacuum phenomenon along the lower lumbar spine. There is chronic loss of height at vertebral body T12. The visualized musculature is unremarkable in appearance. IMPRESSION: 1. No acute abnormality seen to explain the patient's symptoms. 2. Small to moderate hiatal hernia noted. Fluid fills the distal esophagus, likely reflecting mild esophageal dysmotility or gastroesophageal reflux. 3. Mild bibasilar atelectasis noted. 4. Aortic atherosclerosis. 5. Mild bilateral renal atrophy and scarring noted. 6. Mild diverticulosis along the descending and sigmoid colon, without evidence of diverticulitis. 7. Chronic loss of height at vertebral body T12, and mild degenerative change at the lower lumbar spine. Electronically Signed   By: Roanna Raider M.D.   On: 02-13-2016 21:12   Dg Chest Portable 1 View  Result Date: Feb 13, 2016 CLINICAL DATA:  Initial evaluation for worsening shortness of breath. EXAM: PORTABLE CHEST 1 VIEW COMPARISON:  Prior radiograph from earlier the same day. FINDINGS: Stable cardiomegaly.  Mediastinal silhouette within normal limits. Lungs normally inflated. Overall pulmonary vascularity is not significantly changed without significant worsening edema. No pleural effusion. No focal infiltrates. No pneumothorax. Osseous structures unchanged. IMPRESSION: Stable cardiomegaly without radiographic evidence for significant edema. No other acute cardiopulmonary abnormality identified. Electronically Signed   By: Rise Mu M.D.   On: February 13, 2016 23:33   Dg Chest Port 1 View  Result Date: 2016-02-13 CLINICAL DATA:  80 y/o F; nausea vomiting  and  diarrhea with pain starting at approximately 12 p.m. today. EXAM: PORTABLE CHEST 1 VIEW COMPARISON:  07/12/2013 chest radiograph. FINDINGS: Stable enlarged cardiac silhouette given differences in technique. Aortic atherosclerosis with calcifications of the arch. No focal consolidation, pneumothorax, or pleural effusion. IMPRESSION: No active disease.  Stable cardiomegaly and aortic atherosclerosis. Electronically Signed   By: Mitzi Hansen M.D.   On: 02/02/2016 19:26    EKG:   Orders placed or performed during the hospital encounter of 02/15/2016  . EKG 12-Lead  . EKG 12-Lead    ASSESSMENT AND PLAN:   BobbieMilburnis a 80 y.o.femalewho presents with Acute onset abdominal pain with coffee-ground emesis and hematochezia starting today just after lunch time. In the ED her blood pressure stable, hemoglobin is stable, she has persistent abdominal pain and nausea. She is also in A. fib with RVR  1. Acute GI bleed - likely upper and lower both - CT of the abd with hiatal hernia and diverticulosis- could have gastritis - continue IV protonix, no active rectal bleeding noted now - H&H stable - no further nausea or vomiting- starting on a diet today - GI consult NOT AVAILABLE TILL Monday. Pt and family aware that if pt has severe GI bleeding she will need to be transferred to [facility]  2.Atrial fibrillation with RVR (HCC) - off cardizem drip now - HR better controlled, on oral metoprolol and cadizem -no anticoagulation due to GI bleed (pt was on eliquis in the past-she stopped it several months ago)  3.HTN (hypertension) - continue meds as above  4.UncontrolledDiabetes (HCC)  Due to medication non complaince -was on glipizide,metformin and victoza (per [facility] out pt notes)- now starting on lantus- dose being adjusted - hba1c pending - SSI as well   5. Dyspnea on exam- stable sats Discontinue fluids, check ECHO for any CHF - CXR with cardiomegaly, but no pulm edema-  closely monitor  6. DVT Prophylaxis- TEDs and SCDs   All the records are reviewed and case discussed with Care Management/Social Workerr. Management plans discussed with the patient, family and they are in agreement.  CODE STATUS: Full Code  TOTAL TIME TAKING CARE OF THIS PATIENT: 38 minutes.   POSSIBLE D/C IN 1-2 DAYS, DEPENDING ON CLINICAL CONDITION.   Enid Baas M.D on 02/14/2016 at 1:24 PM  Between 7am to 6pm - Pager - [phone]  After 6pm go to [email or web address] - Social research officer, government  [facility]  Office  [phone]  CC: Primary care physician; No PCP Per Patient

## 2016-02-14 NOTE — Progress Notes (Signed)
Dr aware of pt mentation chnages. RN from ICU called in to look at patient . Pt is going down for head CT and stat labs then to ICU 18.

## 2016-02-15 LAB — BLOOD GAS, ARTERIAL
ACID-BASE DEFICIT: 12 mmol/L — AB (ref 0.0–2.0)
Allens test (pass/fail): POSITIVE — AB
BICARBONATE: 12.6 mmol/L — AB (ref 20.0–28.0)
FIO2: 0.28
O2 Saturation: 93.4 %
PCO2 ART: 25 mmHg — AB (ref 32.0–48.0)
PH ART: 7.31 — AB (ref 7.350–7.450)
Patient temperature: 37
pO2, Arterial: 75 mmHg — ABNORMAL LOW (ref 83.0–108.0)

## 2016-02-15 LAB — CBC
HCT: 34.5 % — ABNORMAL LOW (ref 35.0–47.0)
Hemoglobin: 11.9 g/dL — ABNORMAL LOW (ref 12.0–16.0)
MCH: 31.6 pg (ref 26.0–34.0)
MCHC: 34.5 g/dL (ref 32.0–36.0)
MCV: 91.7 fL (ref 80.0–100.0)
PLATELETS: 171 10*3/uL (ref 150–440)
RBC: 3.76 MIL/uL — AB (ref 3.80–5.20)
RDW: 14.8 % — AB (ref 11.5–14.5)
WBC: 11.6 10*3/uL — AB (ref 3.6–11.0)

## 2016-02-15 LAB — BASIC METABOLIC PANEL
Anion gap: 9 (ref 5–15)
BUN: 52 mg/dL — AB (ref 6–20)
CALCIUM: 7.3 mg/dL — AB (ref 8.9–10.3)
CO2: 17 mmol/L — ABNORMAL LOW (ref 22–32)
CREATININE: 2.12 mg/dL — AB (ref 0.44–1.00)
Chloride: 113 mmol/L — ABNORMAL HIGH (ref 101–111)
GFR, EST AFRICAN AMERICAN: 25 mL/min — AB (ref >60)
GFR, EST NON AFRICAN AMERICAN: 21 mL/min — AB (ref >60)
Glucose, Bld: 182 mg/dL — ABNORMAL HIGH (ref 65–99)
Potassium: 4.3 mmol/L (ref 3.5–5.1)
SODIUM: 139 mmol/L (ref 135–145)

## 2016-02-15 LAB — HEMOGLOBIN A1C
HEMOGLOBIN A1C: 11.9 % — AB (ref 4.8–5.6)
MEAN PLASMA GLUCOSE: 295 mg/dL

## 2016-02-15 LAB — GLUCOSE, CAPILLARY
GLUCOSE-CAPILLARY: 220 mg/dL — AB (ref 65–99)
GLUCOSE-CAPILLARY: 174 mg/dL — AB (ref 65–99)
Glucose-Capillary: 208 mg/dL — ABNORMAL HIGH (ref 65–99)
Glucose-Capillary: 167 mg/dL — ABNORMAL HIGH (ref 65–99)

## 2016-02-15 LAB — LACTIC ACID, PLASMA: LACTIC ACID, VENOUS: 1.6 mmol/L (ref 0.5–1.9)

## 2016-02-15 MED ORDER — VANCOMYCIN HCL IN DEXTROSE 1-5 GM/200ML-% IV SOLN
1000.0000 mg | INTRAVENOUS | Status: DC
  Filled 2016-02-15: qty 200

## 2016-02-15 MED ORDER — NOREPINEPHRINE BITARTRATE 1 MG/ML IV SOLN: 0.0000 ug/min | INTRAVENOUS | Status: DC

## 2016-02-15 MED ORDER — DILTIAZEM HCL 30 MG PO TABS
30.0000 mg | ORAL_TABLET | Freq: Four times a day (QID) | ORAL | Status: DC
  Administered 2016-02-15: 30 mg via ORAL
  Filled 2016-02-15: qty 1

## 2016-02-15 MED ORDER — METOPROLOL TARTRATE 25 MG PO TABS
25.0000 mg | ORAL_TABLET | Freq: Two times a day (BID) | ORAL | Status: DC
  Administered 2016-02-15 (×2): 25 mg via ORAL
  Filled 2016-02-15 (×3): qty 1

## 2016-02-15 MED ORDER — DILTIAZEM HCL ER COATED BEADS 240 MG PO CP24
240.0000 mg | ORAL_CAPSULE | Freq: Every day | ORAL | Status: DC
  Administered 2016-02-15: 240 mg via ORAL
  Filled 2016-02-15 (×2): qty 1

## 2016-02-15 MED ORDER — STERILE WATER FOR INJECTION IV SOLN
INTRAVENOUS | Status: DC
  Administered 2016-02-15 – 2016-02-18 (×5): via INTRAVENOUS
  Filled 2016-02-15 (×9): qty 850

## 2016-02-15 MED ORDER — SODIUM CHLORIDE 0.9 % IV BOLUS (SEPSIS): 500.0000 mL | Freq: Once | INTRAVENOUS | Status: DC

## 2016-02-15 MED ORDER — SODIUM CHLORIDE 0.9 % IV BOLUS (SEPSIS)
500.0000 mL | Freq: Once | INTRAVENOUS | Status: AC
  Administered 2016-02-15: 500 mL via INTRAVENOUS

## 2016-02-15 NOTE — Clinical Social Work Note (Signed)
CSW received consult for "home not friendly". CSW following pending AMS clearing. CSW attempted to assess, and the patient was not rousable.   Argentina PonderKaren Martha Klay Sobotka, MSW, LCSW-A [phone]

## 2016-02-15 NOTE — Progress Notes (Signed)
Spoke with Dr. Sheryle Hailiamond about pt's bp remaining low.  Ordered 500ml NS bolus to be given over an hour. Cont to assess for efficacy and notify him if pt's bp remains low.

## 2016-02-15 NOTE — Progress Notes (Signed)
PHARMACY CONSULT NOTE  Pharmacy consulted to assist in electroylyte management in this 22 yoF admitted on 9/21 with GI bleed.   1. Electrolytes:  9/22 am labs K= 3.7; no magnesium level. Pt will receive K 10 mEq x 2 due to insulin drip therapy. Will check BMP and mag with am labs.   9/23: K=4.3  Mag=2.0. Patient no longer on Insulin drip. (Lantus, SSI). No supplementation needed currently. Will f/u with am labs.  9/24: K= 4.3 . No supplementation needed currently. Will f/u with am labs.     Allergies  Allergen Reactions  . Demerol [Meperidine] Nausea And Vomiting  . Reglan [Metoclopramide] Swelling  . Penicillins Swelling and Rash    Swelling to arm at injection site. Has patient had a PCN reaction causing immediate rash, facial/tongue/throat swelling, SOB or lightheadedness with hypotension: Yes Has patient had a PCN reaction causing severe rash involving mucus membranes or skin necrosis: No Has patient had a PCN reaction that required hospitalization No Has patient had a PCN reaction occurring within the last 10 years: No If all of the above answers are "NO", then may proceed with Cephalosporin use.     Patient Measurements: Height: 5\' 4"  (162.6 cm) Weight: 225 lb (102.1 kg) IBW/kg (Calculated) : 54.7   Vital Signs: Temp: 98 F (36.7 C) (09/24 0800) Temp Source: Oral (09/24 0800) BP: 87/69 (09/24 0955) Pulse Rate: 99 (09/24 0957) Intake/Output from previous day: 09/23 0701 - 09/24 0700 In: 2501.3 [P.O.:120; I.V.:1289.6; IV Piggyback:1091.7] Out: 325 [Urine:325] Intake/Output from this shift: Total I/O In: 928.9 [P.O.:120; I.V.:308.9; IV Piggyback:500] Out: 75 [Urine:75]  Labs:  Recent Labs  01/24/2016 1830 02/13/16 0438  02/14/16 1749 02/14/16 1906 02/15/16 0039  WBC 10.0 11.1*  --   --  11.1* 11.6*  HGB 14.8 15.7  < > 12.8 12.9 11.9*  HCT 44.1 47.7*  --   --  39.1 34.5*  PLT 196 201  --   --  169 171  INR 1.00  --   --   --   --   --   < > = values in  this interval not displayed.   Recent Labs  02/19/2016 1830 02/21/2016 1921 02/13/16 0438 02/14/16 0055 02/15/16 0039  NA 138  --  142 140 139  K 3.6  --  3.7 4.3 4.3  CL 104  --  108 114* 113*  CO2 20*  --  19* 20* 17*  GLUCOSE 446*  --  447* 226* 182*  BUN 20  --  20 37* 52*  CREATININE 1.13*  --  1.24* 1.40* 2.12*  CALCIUM 9.3  --  9.1 7.8* 7.3*  MG  --  1.4*  --  2.0  --   PROT 8.2*  --   --   --   --   ALBUMIN 4.0  --   --   --   --   AST 22  --   --   --   --   ALT 16  --   --   --   --   ALKPHOS 68  --   --   --   --   BILITOT 0.8  --   --   --   --    Estimated Creatinine Clearance: 25.4 mL/min (by C-G formula based on SCr of 2.12 mg/dL (H)).    Recent Labs  02/14/16 1732 02/14/16 2140 02/15/16 0744  GLUCAP 141* 140* 208*    Medications:  Scheduled:  . insulin  aspart  0-5 Units Subcutaneous QHS  . insulin aspart  0-9 Units Subcutaneous TID WC  . pantoprazole  40 mg Intravenous Q12H  . piperacillin-tazobactam  3.375 g Intravenous Q8H  . sodium chloride  500 mL Intravenous Once  . sodium chloride flush  3 mL Intravenous Q12H  . vancomycin  1,500 mg Intravenous Q24H   Infusions:  . sodium chloride 150 mL/hr at 02/15/16 0956    Bari MantisKristin Tamim Skog PharmD Clinical Pharmacist 02/15/2016

## 2016-02-15 NOTE — Progress Notes (Signed)
Patient resting quietly in bed with eyes closed at this time. Dr. Gwen PoundsKowalski notified of HR 140s orders received for cardizem and metoprolol, medications administered. HR now 120s to 130s. Poor PO intake with meals. Fluids accepted with ease. Sodium bicarb infusing as ordered.

## 2016-02-15 NOTE — Progress Notes (Signed)
[employer] [facility] - [place] at [facility]   PATIENT NAME: [name]    MR#:  [id number]  DATE OF BIRTH:  1936-03-15  SUBJECTIVE:  CHIEF COMPLAINT:   Chief Complaint  Patient presents with  . Diarrhea  . Abdominal Pain  . Emesis   - patient had an episode where she was staring, was not responding last evening. Blood sugar was fine and vitals were stable at the time. She was transferred to stepdown for close monitoring. -Blood pressure remains low, heart rate was elevated so was started on Cardizem drip. -Decreased urine output and worsening renal failure. -Spiked a fever yesterday and abdomen remains distended  REVIEW OF SYSTEMS:  Review of Systems  Constitutional: Positive for malaise/fatigue. Negative for chills and fever.  HENT: Negative for ear discharge, ear pain and tinnitus.   Eyes: Negative for blurred vision and double vision.  Respiratory: Positive for shortness of breath. Negative for cough and wheezing.   Cardiovascular: Negative for chest pain, palpitations and leg swelling.  Gastrointestinal: Positive for abdominal pain. Negative for constipation, diarrhea, nausea and vomiting.  Genitourinary: Negative for dysuria and urgency.  Musculoskeletal: Negative for myalgias.  Neurological: Negative for dizziness, sensory change, speech change, focal weakness, seizures and headaches.  Psychiatric/Behavioral: Negative for depression.    DRUG ALLERGIES:   Allergies  Allergen Reactions  . Demerol [Meperidine] Nausea And Vomiting  . Reglan [Metoclopramide] Swelling  . Penicillins Swelling and Rash    Swelling to arm at injection site. Has patient had a PCN reaction causing immediate rash, facial/tongue/throat swelling, SOB or lightheadedness with hypotension: Yes Has patient had a PCN reaction causing severe rash involving mucus membranes or skin necrosis: No Has patient had a PCN reaction that required hospitalization No Has patient had a PCN reaction  occurring within the last 10 years: No If all of the above answers are "NO", then may proceed with Cephalosporin use.     VITALS:  Blood pressure (!) 87/69, pulse 99, temperature 98 F (36.7 C), temperature source Oral, resp. rate (!) 27, height 5\' 4"  (1.626 m), weight 102.1 kg (225 lb), SpO2 99 %.  PHYSICAL EXAMINATION:  Physical Exam  GENERAL:  80 y.o.-year-old patient lying in the bed with no acute distress. Appears dyspneic at rest. EYES: Pupils equal, round, reactive to light and accommodation. No scleral icterus. Extraocular muscles intact.  HEENT: Head atraumatic, normocephalic. Oropharynx and nasopharynx clear.  NECK:  Supple, no jugular venous distention. No thyroid enlargement, no tenderness.  LUNGS: Normal breath sounds bilaterally, no wheezing, rales,rhonchi or crepitation. No use of accessory muscles of respiration. Decreased bibasilar breath sounds CARDIOVASCULAR: S1, S2 normal. No murmurs, rubs, or gallops.  ABDOMEN: Soft, obese, non tender, discomfort on palpation in all quadrants with no guarding or rigidity, distended. Bowel sounds present. No organomegaly or mass.  EXTREMITIES: No pedal edema, cyanosis, or clubbing.  NEUROLOGIC: Cranial nerves II through XII are intact. Muscle strength 5/5 in all extremities. Sensation intact. Gait not checked.  PSYCHIATRIC: The patient is alert and oriented x 3.  SKIN: No obvious rash, lesion, or ulcer.    LABORATORY PANEL:   CBC  Recent Labs Lab 02/15/16 0039  WBC 11.6*  HGB 11.9*  HCT 34.5*  PLT 171   ------------------------------------------------------------------------------------------------------------------  Chemistries   Recent Labs Lab 02/17/2016 1830  02/14/16 0055 02/15/16 0039  NA 138  < > 140 139  K 3.6  < > 4.3 4.3  CL 104  < > 114* 113*  CO2 20*  < >  20* 17*  GLUCOSE 446*  < > 226* 182*  BUN 20  < > 37* 52*  CREATININE 1.13*  < > 1.40* 2.12*  CALCIUM 9.3  < > 7.8* 7.3*  MG  --   < > 2.0  --     AST 22  --   --   --   ALT 16  --   --   --   ALKPHOS 68  --   --   --   BILITOT 0.8  --   --   --   < > = values in this interval not displayed. ------------------------------------------------------------------------------------------------------------------  Cardiac Enzymes  Recent Labs Lab 2016/04/04 1830  TROPONINI <0.03   ------------------------------------------------------------------------------------------------------------------  RADIOLOGY:  Dg Chest 1 View  Result Date: 02/14/2016 CLINICAL DATA:  Fever EXAM: CHEST 1 VIEW COMPARISON:  23-Jan-2016 chest radiograph. FINDINGS: Stable cardiomediastinal silhouette with cardiomegaly and aortic atherosclerosis. No pneumothorax. No pleural effusion. No pulmonary edema. No acute consolidative airspace disease. IMPRESSION: Stable mild cardiomegaly without pulmonary edema. No acute pulmonary disease. Aortic atherosclerosis. Electronically Signed   By: Delbert PhenixJason A Poff M.D.   On: 02/14/2016 21:15   Ct Head Wo Contrast  Result Date: 02/14/2016 CLINICAL DATA:  Altered mental status.  Inpatient. EXAM: CT HEAD WITHOUT CONTRAST TECHNIQUE: Contiguous axial images were obtained from the base of the skull through the vertex without intravenous contrast. COMPARISON:  None. FINDINGS: Brain: No evidence of parenchymal hemorrhage or extra-axial fluid collection. No mass lesion, mass effect, or midline shift. No CT evidence of acute infarction. Intracranial atherosclerosis. Generalized cerebral volume loss. Nonspecific moderate subcortical and periventricular white matter hypodensity, most in keeping with chronic small vessel ischemic change. No ventriculomegaly. Vascular: No hyperdense vessel or unexpected calcification. Skull: No evidence of calvarial fracture. Sinuses/Orbits: The visualized paranasal sinuses are essentially clear. Other: Patient is edentulous. The mastoid air cells are unopacified. IMPRESSION: 1.  No evidence of acute intracranial  abnormality. 2. Generalized cerebral volume loss and moderate chronic small vessel ischemia. Electronically Signed   By: Delbert PhenixJason A Poff M.D.   On: 02/14/2016 17:38    EKG:   Orders placed or performed during the hospital encounter of 2016/04/04  . EKG 12-Lead  . EKG 12-Lead    ASSESSMENT AND PLAN:   BobbieMilburnis a 80 y.o.femalewho presents with Acute onset abdominal pain with coffee-ground emesis and hematochezia, also had hyperglycemia, afib rvr and new mental status changes yesterday.  #1 AMS- sugars were fine then, lasted for a few minutes - CT of the head with no acute findings, has chronic microvascular changes -not sure if had seizures before, monitor for now, back to baseline- if happens again- will order, neuro consult, EEG and MRI - physical therapy today if able to  #2 Acute GI bleed - likely upper and lower both- gastritis and diverticulitis probably - stopped now - CT of the abd with hiatal hernia and diverticulosis- could have gastritis - continue IV protonix, no active rectal bleeding noted now - H&H slow drop, also receiving IV fluids - no further nausea or vomiting- advance diet - GI consult NOT AVAILABLE TILL Monday. Pt and family aware that if pt has severe GI bleeding she will need [facility]  #3 .Atrial fibrillation with RVR (HCC) - BP low, restarted on cardizem drip, stop oral meds - HR better controlled -no anticoagulation due to GI bleed (pt was on eliquis in the past-she stopped it several months ago)  #4 [facility])  Due to medication non complaince -  oral meds - glipizide,metformin and victoza (per [facility] out pt notes)- on hold - sugars have been ok since admission as hasnt been eating much - a1c pending, on SSI  5. Dyspnea on exam- stable sats, ABG with hypoxia  ? Chronic deconditioning, on IV fluids now with possible sepsis - CXR with cardiomegaly, but no pulm edema- closely monitor - ECHo normal  #6  Sepsis- unknown source, hypotension, not on pressors yet Maintain MAP >65 - fevers yesterday- cultures pending  - abd is distended, mildly tender on exam- CT abd on adm without any acute findings- monitor closely- if worsens- consult general surgery  #7 ARF- ATN from sepsis, IV fluids Maintain BP MAP>65, kidneys on the CT abd without any acute findings - avoid nephrotoxins, monitor  6. DVT Prophylaxis- TEDs and SCDs   Patient is alert oriented now, discussed code status- wants to be full code Also family situation not very friendly, not sure if can safely return home- social worker consult   All the records are reviewed and case discussed with Care Management/Social Workerr. Management plans discussed with the patient, family and they are in agreement.  CODE STATUS: Full Code  TOTAL CRITICAL CARE TIME TAKING CARE OF THIS PATIENT: 45 minutes.   POSSIBLE D/C IN 3 DAYS, DEPENDING ON CLINICAL CONDITION.   Enid Baas M.D on 02/15/2016 at 10:08 AM  Between 7am to 6pm - Pager - [phone]  After 6pm go to [email or web address] - Social research officer, government  [facility] [place] [facility]  Office  [phone]  CC: Primary care physician; No PCP Per Patient

## 2016-02-15 NOTE — Progress Notes (Signed)
[facility] Cardiology [facility] Encounter Note  Patient: [name] / Admit Date: 02/02/2016 / Date of Encounter: 02/15/2016, 6:52 AM   Subjective: Patient unable to take oral medication at this time and still somewhat nauseated and therefore had recurrence of atrial fibrillation with rapid ventricular rate. Diltiazem drip restarted until patient can take adequate amount of oral medication management for heart rate control of atrial fibrillation. Current heart rate control reasonable. No evidence of bleeding  Review of Systems: Positive for: Abdominal pain nausea Negative for: Vision change, hearing change, syncope, dizziness, vomiting,diarrhea, bloody stool, stomach pain, cough, congestion, diaphoresis, urinary frequency, urinary pain,skin lesions, skin rashes Others previously listed  Objective: Telemetry: Atrial fibrillation with controlled ventricular rate Physical Exam: Blood pressure (!) 95/56, pulse 94, temperature 98.5 F (36.9 C), temperature source Oral, resp. rate (!) 34, height 5\' 4"  (1.626 m), weight 225 lb (102.1 kg), SpO2 99 %. Body mass index is 38.62 kg/m. General: Well developed, well nourished, in no acute distress. Head: Normocephalic, atraumatic, sclera non-icteric, no xanthomas, nares are without discharge. Neck: No apparent masses Lungs: Normal respirations with no wheezes, no rhonchi, no rales , no crackles   Heart: Irregular rate and rhythm, normal S1 S2, no murmur, no rub, no gallop, PMI is normal size and placement, carotid upstroke normal without bruit, jugular venous pressure normal Abdomen: Soft,  tender, non-distended with normoactive bowel sounds. No hepatosplenomegaly. Abdominal aorta is normal size without bruit Extremities: Trace edema, no clubbing, no cyanosis, no ulcers,  Peripheral: 2+ radial, 2+ femoral, 2+ dorsal pedal pulses Neuro: Alert and oriented. Moves all extremities spontaneously. Psych:  Responds to questions appropriately with a  normal affect.   Intake/Output Summary (Last 24 hours) at 02/15/16 0652 Last data filed at 02/15/16 1610  Gross per 24 hour  Intake           2386.3 ml  Output              325 ml  Net           2061.3 ml    Inpatient Medications:  . insulin aspart  0-5 Units Subcutaneous QHS  . insulin aspart  0-9 Units Subcutaneous TID WC  . metoprolol tartrate  50 mg Oral BID  . pantoprazole  40 mg Intravenous Q12H  . piperacillin-tazobactam  3.375 g Intravenous Q8H  . sodium chloride  500 mL Intravenous Once  . sodium chloride flush  3 mL Intravenous Q12H  . vancomycin  1,500 mg Intravenous Q24H   Infusions:  . sodium chloride 150 mL/hr at 02/15/16 0614  . diltiazem (CARDIZEM) infusion 2.5 mg/hr (02/15/16 0015)    Labs:  Recent Labs  02/04/2016 1921  02/14/16 0055 02/15/16 0039  NA  --   < > 140 139  K  --   < > 4.3 4.3  CL  --   < > 114* 113*  CO2  --   < > 20* 17*  GLUCOSE  --   < > 226* 182*  BUN  --   < > 37* 52*  CREATININE  --   < > 1.40* 2.12*  CALCIUM  --   < > 7.8* 7.3*  MG 1.4*  --  2.0  --   < > = values in this interval not displayed.  Recent Labs  02/06/2016 1830  AST 22  ALT 16  ALKPHOS 68  BILITOT 0.8  PROT 8.2*  ALBUMIN 4.0    Recent Labs  01/31/2016 1830  02/14/16 1906 02/15/16 0039  WBC 10.0  < > 11.1* 11.6*  NEUTROABS 7.7*  --  8.9*  --   HGB 14.8  < > 12.9 11.9*  HCT 44.1  < > 39.1 34.5*  MCV 91.8  < > 92.9 91.7  PLT 196  < > 169 171  < > = values in this interval not displayed.  Recent Labs  02/01/2016 1830  TROPONINI <0.03   Invalid input(s): POCBNP No results for input(s): HGBA1C in the last 72 hours.   Weights: Filed Weights   01/30/2016 1811  Weight: 225 lb (102.1 kg)     Radiology/Studies:  Dg Chest 1 View  Result Date: 02/14/2016 CLINICAL DATA:  Fever EXAM: CHEST 1 VIEW COMPARISON:  01/25/2016 chest radiograph. FINDINGS: Stable cardiomediastinal silhouette with cardiomegaly and aortic atherosclerosis. No pneumothorax. No  pleural effusion. No pulmonary edema. No acute consolidative airspace disease. IMPRESSION: Stable mild cardiomegaly without pulmonary edema. No acute pulmonary disease. Aortic atherosclerosis. Electronically Signed   By: Delbert Phenix M.D.   On: 02/14/2016 21:15   Ct Head Wo Contrast  Result Date: 02/14/2016 CLINICAL DATA:  Altered mental status.  Inpatient. EXAM: CT HEAD WITHOUT CONTRAST TECHNIQUE: Contiguous axial images were obtained from the base of the skull through the vertex without intravenous contrast. COMPARISON:  None. FINDINGS: Brain: No evidence of parenchymal hemorrhage or extra-axial fluid collection. No mass lesion, mass effect, or midline shift. No CT evidence of acute infarction. Intracranial atherosclerosis. Generalized cerebral volume loss. Nonspecific moderate subcortical and periventricular white matter hypodensity, most in keeping with chronic small vessel ischemic change. No ventriculomegaly. Vascular: No hyperdense vessel or unexpected calcification. Skull: No evidence of calvarial fracture. Sinuses/Orbits: The visualized paranasal sinuses are essentially clear. Other: Patient is edentulous. The mastoid air cells are unopacified. IMPRESSION: 1.  No evidence of acute intracranial abnormality. 2. Generalized cerebral volume loss and moderate chronic small vessel ischemia. Electronically Signed   By: Delbert Phenix M.D.   On: 02/14/2016 17:38   Ct Abdomen Pelvis W Contrast  Result Date: 01/31/2016 CLINICAL DATA:  Acute onset of epigastric abdominal pain and nausea. Vomiting and diarrhea. Initial encounter. EXAM: CT ABDOMEN AND PELVIS WITH CONTRAST TECHNIQUE: Multidetector CT imaging of the abdomen and pelvis was performed using the standard protocol following bolus administration of intravenous contrast. CONTRAST:  75mL ISOVUE-300 IOPAMIDOL (ISOVUE-300) INJECTION 61% COMPARISON:  PET/CT performed 07/09/2013 FINDINGS: Lower chest: Mild bibasilar atelectasis is noted. The previously noted  right lower lobe masslike density has resolved. A small to moderate hiatal hernia is seen, with fluid filling the distal esophagus. Hepatobiliary: The liver is unremarkable in appearance. The gallbladder is unremarkable in appearance. The common bile duct remains normal in caliber. Pancreas: The pancreas is within normal limits. Spleen: The spleen is unremarkable in appearance. A likely small calcified aneurysm is noted at the splenic hilum. Adrenals/Urinary Tract: The adrenal glands are unremarkable in appearance. Mild bilateral renal atrophy and scarring are noted, with underlying perinephric stranding. There is no evidence of hydronephrosis. No renal or ureteral stones are identified. Stomach/Bowel: The stomach is otherwise unremarkable in appearance. The small bowel is within normal limits. The appendix is normal in caliber, without evidence of appendicitis. Mild diverticulosis is noted along the descending and sigmoid colon, without evidence of diverticulitis. Vascular/Lymphatic: Scattered calcification is seen along the abdominal aorta and its branches. The abdominal aorta is otherwise grossly unremarkable. The inferior vena cava is grossly unremarkable. No retroperitoneal lymphadenopathy is seen. No pelvic sidewall lymphadenopathy is identified. Reproductive: The bladder is significantly distended and grossly unremarkable.  The patient is status post hysterectomy. No suspicious adnexal masses are seen. Other: No additional soft tissue abnormalities are seen. Musculoskeletal: No acute osseous abnormalities are identified. Endplate sclerotic change is noted at L4-L5, with associated vacuum phenomenon along the lower lumbar spine. There is chronic loss of height at vertebral body T12. The visualized musculature is unremarkable in appearance. IMPRESSION: 1. No acute abnormality seen to explain the patient's symptoms. 2. Small to moderate hiatal hernia noted. Fluid fills the distal esophagus, likely reflecting mild  esophageal dysmotility or gastroesophageal reflux. 3. Mild bibasilar atelectasis noted. 4. Aortic atherosclerosis. 5. Mild bilateral renal atrophy and scarring noted. 6. Mild diverticulosis along the descending and sigmoid colon, without evidence of diverticulitis. 7. Chronic loss of height at vertebral body T12, and mild degenerative change at the lower lumbar spine. Electronically Signed   By: Roanna RaiderJeffery  Chang M.D.   On: 02/10/2016 21:12   Dg Chest Portable 1 View  Result Date: 02/19/2016 CLINICAL DATA:  Initial evaluation for worsening shortness of breath. EXAM: PORTABLE CHEST 1 VIEW COMPARISON:  Prior radiograph from earlier the same day. FINDINGS: Stable cardiomegaly.  Mediastinal silhouette within normal limits. Lungs normally inflated. Overall pulmonary vascularity is not significantly changed without significant worsening edema. No pleural effusion. No focal infiltrates. No pneumothorax. Osseous structures unchanged. IMPRESSION: Stable cardiomegaly without radiographic evidence for significant edema. No other acute cardiopulmonary abnormality identified. Electronically Signed   By: Rise MuBenjamin  McClintock M.D.   On: 02/11/2016 23:33   Dg Chest Port 1 View  Result Date: 02/08/2016 CLINICAL DATA:  80 y/o F; nausea vomiting and diarrhea with pain starting at approximately 12 p.m. today. EXAM: PORTABLE CHEST 1 VIEW COMPARISON:  07/12/2013 chest radiograph. FINDINGS: Stable enlarged cardiac silhouette given differences in technique. Aortic atherosclerosis with calcifications of the arch. No focal consolidation, pneumothorax, or pleural effusion. IMPRESSION: No active disease.  Stable cardiomegaly and aortic atherosclerosis. Electronically Signed   By: Mitzi HansenLance  Furusawa-Stratton M.D.   On: 02/11/2016 19:26     Assessment and Recommendation  80 y.o. female with essential hypertension mixed hyperlipidemia chronic kidney disease stage III with GI bleed which is exacerbated her paroxysmal nonvalvular atrial  fibrillation now with rapid ventricular rate and unable to take oral medications for heart rate control needing diltiazem drip 1. Continue diltiazem drip until patient able to take adequate oral medication management for heart rate control 2. Metoprolol for heart rate control at 50 mg twice per day and further consideration of diltiazem orally when able for better heart rate control 3. No further addition of anticoagulation due to GI bleed Or. Begin ambulation and adjustments of medication management 5. No further cardiac diagnostics necessary at this time  Signed, Arnoldo HookerBruce Reatha Sur M.D. FACC

## 2016-02-15 NOTE — Progress Notes (Signed)
Dr. Nemiah CommanderKalisetti notified of ABG results and patient's increase in heart rate from 110s to 120s new orders received.

## 2016-02-15 NOTE — Progress Notes (Signed)
Pharmacy Antibiotic Note  [name] is a 80 y.o. female admitted on 05/26/2015 with sepsis-source unclear.  Pharmacy has been consulted for Zosyn and vancomycin dosing.  Plan: 1. Zosyn 3.375 gm IV Q8H EI 2. Vancomycin 1.5 gm IV x 1 followed in approximately 10 hours (stacked dosing) by vancomycin 1.5 gm IV Q24H, predicted trough 15 mcg/mL. Pharmacy will continue to follow and adjust as needed to maintain trough 15 to 20 mcg/mL.  Vd 71.5 L, Ke 0.036 hr-1, T1/2 19.1 hr  9/24:  Scr increased 1.40 >> 2.12. Will adjust Vancomycin to 1 gram IV q36h. Ke 0.020  T1/2 34.66  Vd 51.59.  (Adj BW= 73.7kg).   Trough 9/27 prior to 4th dose. Bcx Pending, MRSA PCR negative,   Height: 5\' 4"  (162.6 cm) Weight: 225 lb (102.1 kg) IBW/kg (Calculated) : 54.7  Adj BW= 73.7 kg  Temp (24hrs), Avg:99 F (37.2 C), Min:98 F (36.7 C), Max:101.5 F (38.6 C)   Recent Labs Lab 01/16/2016 1830 01/16/2016 1920 02/13/16 0438 02/14/16 0055 02/14/16 1755 02/14/16 1906 02/14/16 2024 02/15/16 0039  WBC 10.0  --  11.1*  --   --  11.1*  --  11.6*  CREATININE 1.13*  --  1.24* 1.40*  --   --   --  2.12*  LATICACIDVEN  --  2.0*  --   --  2.0*  --  2.0* 1.6    Estimated Creatinine Clearance: 25.4 mL/min (by C-G formula based on SCr of 2.12 mg/dL (H)).    Allergies  Allergen Reactions  . Demerol [Meperidine] Nausea And Vomiting  . Reglan [Metoclopramide] Swelling  . Penicillins Swelling and Rash    Swelling to arm at injection site. Has patient had a PCN reaction causing immediate rash, facial/tongue/throat swelling, SOB or lightheadedness with hypotension: Yes Has patient had a PCN reaction causing severe rash involving mucus membranes or skin necrosis: No Has patient had a PCN reaction that required hospitalization No Has patient had a PCN reaction occurring within the last 10 years: No If all of the above answers are "NO", then may proceed with Cephalosporin use.     Thank you for allowing pharmacy to  be a part of this patient's care.  Jamala Kohen A, Pharm.D., BCPS Clinical Pharmacist  02/15/2016 10:54 AM

## 2016-02-15 NOTE — Progress Notes (Signed)
Spoke with Dr. Sheryle Hailiamond and he ordered another 500ml bolus for low bp and low urine output and increase ivf to 14250ml/h. Cont to monitor and notify with changes or concerns.

## 2016-02-15 NOTE — Progress Notes (Signed)
Dr. Anne HahnWillis paged and notified of patients decrease in bp and hr and he wanted to titrate diltiazem gtt to 2.5mg /hr and continue to monitor.

## 2016-02-16 ENCOUNTER — Inpatient Hospital Stay: Payer: Commercial Managed Care - HMO

## 2016-02-16 DIAGNOSIS — R197 Diarrhea, unspecified: Secondary | ICD-10-CM

## 2016-02-16 DIAGNOSIS — K922 Gastrointestinal hemorrhage, unspecified: Secondary | ICD-10-CM

## 2016-02-16 DIAGNOSIS — R112 Nausea with vomiting, unspecified: Secondary | ICD-10-CM

## 2016-02-16 DIAGNOSIS — R140 Abdominal distension (gaseous): Secondary | ICD-10-CM

## 2016-02-16 LAB — BASIC METABOLIC PANEL
ANION GAP: 12 (ref 5–15)
BUN: 54 mg/dL — ABNORMAL HIGH (ref 6–20)
CALCIUM: 7 mg/dL — AB (ref 8.9–10.3)
CO2: 18 mmol/L — AB (ref 22–32)
CREATININE: 1.97 mg/dL — AB (ref 0.44–1.00)
Chloride: 112 mmol/L — ABNORMAL HIGH (ref 101–111)
GFR, EST AFRICAN AMERICAN: 27 mL/min — AB (ref >60)
GFR, EST NON AFRICAN AMERICAN: 23 mL/min — AB (ref >60)
Glucose, Bld: 196 mg/dL — ABNORMAL HIGH (ref 65–99)
Potassium: 3.7 mmol/L (ref 3.5–5.1)
SODIUM: 142 mmol/L (ref 135–145)

## 2016-02-16 LAB — CBC
HCT: 33.5 % — ABNORMAL LOW (ref 35.0–47.0)
Hemoglobin: 11.4 g/dL — ABNORMAL LOW (ref 12.0–16.0)
MCH: 31.4 pg (ref 26.0–34.0)
MCHC: 34.1 g/dL (ref 32.0–36.0)
MCV: 92.1 fL (ref 80.0–100.0)
PLATELETS: 155 10*3/uL (ref 150–440)
RBC: 3.64 MIL/uL — ABNORMAL LOW (ref 3.80–5.20)
RDW: 14.5 % (ref 11.5–14.5)
WBC: 9.9 10*3/uL (ref 3.6–11.0)

## 2016-02-16 LAB — GLUCOSE, CAPILLARY
GLUCOSE-CAPILLARY: 198 mg/dL — AB (ref 65–99)
GLUCOSE-CAPILLARY: 187 mg/dL — AB (ref 65–99)
Glucose-Capillary: 198 mg/dL — ABNORMAL HIGH (ref 65–99)
Glucose-Capillary: 217 mg/dL — ABNORMAL HIGH (ref 65–99)
Glucose-Capillary: 245 mg/dL — ABNORMAL HIGH (ref 65–99)

## 2016-02-16 MED ORDER — SODIUM CHLORIDE 0.9 % IV SOLN
2.0000 g | Freq: Once | INTRAVENOUS | Status: AC
  Administered 2016-02-16: 2 g via INTRAVENOUS
  Filled 2016-02-16: qty 20

## 2016-02-16 MED ORDER — HYDROCODONE-ACETAMINOPHEN 5-325 MG PO TABS: 1.0000 | ORAL_TABLET | Freq: Four times a day (QID) | ORAL | Status: DC | PRN

## 2016-02-16 MED ORDER — SENNOSIDES-DOCUSATE SODIUM 8.6-50 MG PO TABS
1.0000 | ORAL_TABLET | Freq: Two times a day (BID) | ORAL | Status: DC
  Administered 2016-02-16 – 2016-02-23 (×16): 1 via ORAL
  Filled 2016-02-16 (×17): qty 1

## 2016-02-16 MED ORDER — METHYLPREDNISOLONE SODIUM SUCC 40 MG IJ SOLR
40.0000 mg | Freq: Two times a day (BID) | INTRAMUSCULAR | Status: DC
  Administered 2016-02-16 – 2016-02-17 (×3): 40 mg via INTRAVENOUS
  Filled 2016-02-16 (×3): qty 1

## 2016-02-16 MED ORDER — PANTOPRAZOLE SODIUM 40 MG PO TBEC
40.0000 mg | DELAYED_RELEASE_TABLET | Freq: Two times a day (BID) | ORAL | Status: DC
  Administered 2016-02-16 – 2016-02-23 (×14): 40 mg via ORAL
  Filled 2016-02-16 (×14): qty 1

## 2016-02-16 MED ORDER — DILTIAZEM HCL 30 MG PO TABS
30.0000 mg | ORAL_TABLET | Freq: Four times a day (QID) | ORAL | Status: DC
  Administered 2016-02-16 – 2016-02-17 (×4): 30 mg via ORAL
  Filled 2016-02-16 (×4): qty 1

## 2016-02-16 MED ORDER — HEPARIN SODIUM (PORCINE) 5000 UNIT/ML IJ SOLN
5000.0000 [IU] | Freq: Three times a day (TID) | INTRAMUSCULAR | Status: DC
  Administered 2016-02-16 – 2016-02-22 (×18): 5000 [IU] via SUBCUTANEOUS
  Filled 2016-02-16 (×18): qty 1

## 2016-02-16 MED ORDER — FUROSEMIDE 10 MG/ML IJ SOLN
80.0000 mg | Freq: Once | INTRAMUSCULAR | Status: AC
  Administered 2016-02-16: 80 mg via INTRAVENOUS
  Filled 2016-02-16: qty 8

## 2016-02-16 MED ORDER — ACETAMINOPHEN 325 MG PO TABS: 650.0000 mg | ORAL_TABLET | Freq: Four times a day (QID) | ORAL | Status: DC | PRN

## 2016-02-16 NOTE — Progress Notes (Signed)
Paged and spoke with MD Nemiah CommanderKalisetti to inform that patients heart rate has consistently running A. Fib with a rate in the 120's and that the patients BP has started dropping. Started out this AM systolic in low 100's, trending down to SBP in low 90's, current SBP at 1030 87. MD stated to hold her metoprolol and diltiazem until she is able to round on patient and speak with cardiology. Will continue to monitor patient.

## 2016-02-16 NOTE — Progress Notes (Addendum)
Chaplain rounded the unit to provide a compassionate presence and support to the patient. The patient family requested assistance in completing a financial power of attorney. The patient and family was advised that we assist in completing an Advance Directive / Health Care Power of Attorney / Living Will.  Health Care Power of Attorney / Living Will information was discussed and left. Jefm PettyChaplain Britney Captain [phone]

## 2016-02-16 NOTE — Progress Notes (Signed)
Inpatient Diabetes Program Recommendations  AACE/ADA: New Consensus Statement on Inpatient Glycemic Control (2015)  Target Ranges:  Prepandial:   less than 140 mg/dL      Peak postprandial:   less than 180 mg/dL (1-2 hours)      Critically ill patients:  140 - 180 mg/dL   Results for Dyanne CarrelMILBURN, Jalila L (MRN [id number]) as of 02/16/2016 08:10  Ref. Range 02/15/2016 07:44 02/15/2016 11:07 02/15/2016 16:06 02/15/2016 21:08 02/16/2016 07:28  Glucose-Capillary Latest Ref Range: 65 - 99 mg/dL 409208 (H) 811220 (H) 914174 (H) 167 (H) 198 (H)   Review of Glycemic Control  Current orders for Inpatient glycemic control: Novolog 0-9 units TID with meals, Novolog 0-5 units QHS  Inpatient Diabetes Program Recommendations Insulin - Basal: Please consider ordering Lantus 5 units Q24H (based on 102 kg x 0.05 units).  Thanks, Orlando PennerMarie Beckey Polkowski, RN, MSN, CDE Diabetes Coordinator Inpatient Diabetes Program [phone] (Team Pager from 8am to 5pm) [phone] ([facility] office) [phone] [facility] office) [phone] [facility] office)

## 2016-02-16 NOTE — Progress Notes (Signed)
Spoke with Dr. Sheryle Hailiamond about pt now having crackles bilaterally. She is currently 5L ahead.  He will order a cxr to assess for pulmonary edema and then if needed lasix. Cont to assess and notify MD of any changes in pt status.

## 2016-02-16 NOTE — Progress Notes (Signed)
NGT inserted to rt nare x 1 attempt by Lowella BandyNikki, Charity fundraiserN.  To low intermittent suction, xray ordered to confirm placement.  Pt tolerated well.  Immediate return of bile colored gastric contents

## 2016-02-16 NOTE — Progress Notes (Signed)
Pt transferred out from CCU.  Alert, oriented to person, place and time.  No distress on 2LO2 per [place].  Lungs diminished bil.  Abdomen distended and taut.  Foley patent with yellow urine.  SCD"s in place. Cardiac monitor placed on pt and verified with Solmon IceNiki, CNA.  Oriented to room and surroundings.  Denies need. CB in reach, SR up x 3, bed alarm on.

## 2016-02-16 NOTE — Progress Notes (Signed)
[facility] Encounter Note  Patient: [name] / Admit Date: 02/04/2016 / Date of Encounter: 02/16/2016, 1:38 PM   Subjective: Patient unable to take oral medication at this time and still somewhat nauseated and therefore had recurrence of atrial fibrillation with rapid ventricular rate. Diltiazem drip restarted until patient can take adequate amount of oral medication management for heart rate control of atrial fibrillation. Possibility of reinstatement of combination of beta blocker and Cardizem long-acting  Review of Systems: Positive for: Abdominal pain nausea Negative for: Vision change, hearing change, syncope, dizziness, vomiting,diarrhea, bloody stool, stomach pain, cough, congestion, diaphoresis, urinary frequency, urinary pain,skin lesions, skin rashes Others previously listed  Objective: Telemetry: Atrial fibrillation with rapid ventricular rate Physical Exam: Blood pressure 93/66, pulse (!) 109, temperature 97.4 F (36.3 C), temperature source Axillary, resp. rate (!) 27, height 5\' 4"  (1.626 m), weight 102.1 kg (225 lb), SpO2 98 %. Body mass index is 38.62 kg/m. General: Well developed, well nourished, in no acute distress. Head: Normocephalic, atraumatic, sclera non-icteric, no xanthomas, nares are without discharge. Neck: No apparent masses Lungs: Normal respirations with no wheezes, no rhonchi, no rales , no crackles   Heart: Irregular rate and rhythm, normal S1 S2, no murmur, no rub, no gallop, PMI is normal size and placement, carotid upstroke normal without bruit, jugular venous pressure normal Abdomen: Soft,  tender, non-distended with normoactive bowel sounds. No hepatosplenomegaly. Abdominal aorta is normal size without bruit Extremities: Trace edema, no clubbing, no cyanosis, no ulcers,  Peripheral: 2+ radial, 2+ femoral, 2+ dorsal pedal pulses Neuro: Alert and oriented. Moves all extremities spontaneously. Psych:  Responds to questions  appropriately with a normal affect.   Intake/Output Summary (Last 24 hours) at 02/16/16 1338 Last data filed at 02/16/16 1247  Gross per 24 hour  Intake             2170 ml  Output             2070 ml  Net              100 ml    Inpatient Medications:  . diltiazem  30 mg Oral Q6H  . heparin subcutaneous  5,000 Units Subcutaneous Q8H  . insulin aspart  0-5 Units Subcutaneous QHS  . insulin aspart  0-9 Units Subcutaneous TID WC  . methylPREDNISolone (SOLU-MEDROL) injection  40 mg Intravenous Q12H  . pantoprazole  40 mg Intravenous Q12H  . sodium chloride  500 mL Intravenous Once  . sodium chloride flush  3 mL Intravenous Q12H   Infusions:  .  sodium bicarbonate 150 mEq in sterile water 1000 mL infusion 50 mL/hr at 02/16/16 0800    Labs:  Recent Labs  02/14/16 0055 02/15/16 0039 02/16/16 0411  NA 140 139 142  K 4.3 4.3 3.7  CL 114* 113* 112*  CO2 20* 17* 18*  GLUCOSE 226* 182* 196*  BUN 37* 52* 54*  CREATININE 1.40* 2.12* 1.97*  CALCIUM 7.8* 7.3* 7.0*  MG 2.0  --   --    No results for input(s): AST, ALT, ALKPHOS, BILITOT, PROT, ALBUMIN in the last 72 hours.  Recent Labs  02/14/16 1906 02/15/16 0039 02/16/16 0411  WBC 11.1* 11.6* 9.9  NEUTROABS 8.9*  --   --   HGB 12.9 11.9* 11.4*  HCT 39.1 34.5* 33.5*  MCV 92.9 91.7 92.1  PLT 169 171 155   No results for input(s): CKTOTAL, CKMB, TROPONINI in the last 72 hours. Invalid input(s): POCBNP  Recent Labs  02/14/16 0905  HGBA1C 11.9*     Weights: Filed Weights   2016-03-11 1811  Weight: 102.1 kg (225 lb)     Radiology/Studies:  Dg Chest 1 View  Result Date: 02/14/2016 CLINICAL DATA:  Fever EXAM: CHEST 1 VIEW COMPARISON:  03-11-2016 chest radiograph. FINDINGS: Stable cardiomediastinal silhouette with cardiomegaly and aortic atherosclerosis. No pneumothorax. No pleural effusion. No pulmonary edema. No acute consolidative airspace disease. IMPRESSION: Stable mild cardiomegaly without pulmonary edema. No  acute pulmonary disease. Aortic atherosclerosis. Electronically Signed   By: Delbert Phenix M.D.   On: 02/14/2016 21:15   Ct Head Wo Contrast  Result Date: 02/14/2016 CLINICAL DATA:  Altered mental status.  Inpatient. EXAM: CT HEAD WITHOUT CONTRAST TECHNIQUE: Contiguous axial images were obtained from the base of the skull through the vertex without intravenous contrast. COMPARISON:  None. FINDINGS: Brain: No evidence of parenchymal hemorrhage or extra-axial fluid collection. No mass lesion, mass effect, or midline shift. No CT evidence of acute infarction. Intracranial atherosclerosis. Generalized cerebral volume loss. Nonspecific moderate subcortical and periventricular white matter hypodensity, most in keeping with chronic small vessel ischemic change. No ventriculomegaly. Vascular: No hyperdense vessel or unexpected calcification. Skull: No evidence of calvarial fracture. Sinuses/Orbits: The visualized paranasal sinuses are essentially clear. Other: Patient is edentulous. The mastoid air cells are unopacified. IMPRESSION: 1.  No evidence of acute intracranial abnormality. 2. Generalized cerebral volume loss and moderate chronic small vessel ischemia. Electronically Signed   By: Delbert Phenix M.D.   On: 02/14/2016 17:38   Ct Abdomen Pelvis W Contrast  Result Date: 03/11/16 CLINICAL DATA:  Acute onset of epigastric abdominal pain and nausea. Vomiting and diarrhea. Initial encounter. EXAM: CT ABDOMEN AND PELVIS WITH CONTRAST TECHNIQUE: Multidetector CT imaging of the abdomen and pelvis was performed using the standard protocol following bolus administration of intravenous contrast. CONTRAST:  75mL ISOVUE-300 IOPAMIDOL (ISOVUE-300) INJECTION 61% COMPARISON:  PET/CT performed 07/09/2013 FINDINGS: Lower chest: Mild bibasilar atelectasis is noted. The previously noted right lower lobe masslike density has resolved. A small to moderate hiatal hernia is seen, with fluid filling the distal esophagus. Hepatobiliary:  The liver is unremarkable in appearance. The gallbladder is unremarkable in appearance. The common bile duct remains normal in caliber. Pancreas: The pancreas is within normal limits. Spleen: The spleen is unremarkable in appearance. A likely small calcified aneurysm is noted at the splenic hilum. Adrenals/Urinary Tract: The adrenal glands are unremarkable in appearance. Mild bilateral renal atrophy and scarring are noted, with underlying perinephric stranding. There is no evidence of hydronephrosis. No renal or ureteral stones are identified. Stomach/Bowel: The stomach is otherwise unremarkable in appearance. The small bowel is within normal limits. The appendix is normal in caliber, without evidence of appendicitis. Mild diverticulosis is noted along the descending and sigmoid colon, without evidence of diverticulitis. Vascular/Lymphatic: Scattered calcification is seen along the abdominal aorta and its branches. The abdominal aorta is otherwise grossly unremarkable. The inferior vena cava is grossly unremarkable. No retroperitoneal lymphadenopathy is seen. No pelvic sidewall lymphadenopathy is identified. Reproductive: The bladder is significantly distended and grossly unremarkable. The patient is status post hysterectomy. No suspicious adnexal masses are seen. Other: No additional soft tissue abnormalities are seen. Musculoskeletal: No acute osseous abnormalities are identified. Endplate sclerotic change is noted at L4-L5, with associated vacuum phenomenon along the lower lumbar spine. There is chronic loss of height at vertebral body T12. The visualized musculature is unremarkable in appearance. IMPRESSION: 1. No acute abnormality seen to explain the patient's symptoms. 2. Small to moderate hiatal  hernia noted. Fluid fills the distal esophagus, likely reflecting mild esophageal dysmotility or gastroesophageal reflux. 3. Mild bibasilar atelectasis noted. 4. Aortic atherosclerosis. 5. Mild bilateral renal atrophy  and scarring noted. 6. Mild diverticulosis along the descending and sigmoid colon, without evidence of diverticulitis. 7. Chronic loss of height at vertebral body T12, and mild degenerative change at the lower lumbar spine. Electronically Signed   By: Roanna RaiderJeffery  Chang M.D.   On: 24-Sep-2015 21:12   Dg Chest Port 1 View  Result Date: 02/16/2016 CLINICAL DATA:  80 year old female with cough. EXAM: PORTABLE CHEST 1 VIEW COMPARISON:  Chest radiograph dated 02/14/2016 and CT dated 07/06/2013 FINDINGS: Single portable view of the chest demonstrates clear lungs. There is no pleural effusion or pneumothorax. Stable cardiomegaly. There is degenerative changes of the spine and shoulders. No acute fracture. IMPRESSION: No acute cardiopulmonary process. Stable mild cardiomegaly. Electronically Signed   By: Elgie CollardArash  Radparvar M.D.   On: 02/16/2016 05:30   Dg Chest Portable 1 View  Result Date: 2016/04/29 CLINICAL DATA:  Initial evaluation for worsening shortness of breath. EXAM: PORTABLE CHEST 1 VIEW COMPARISON:  Prior radiograph from earlier the same day. FINDINGS: Stable cardiomegaly.  Mediastinal silhouette within normal limits. Lungs normally inflated. Overall pulmonary vascularity is not significantly changed without significant worsening edema. No pleural effusion. No focal infiltrates. No pneumothorax. Osseous structures unchanged. IMPRESSION: Stable cardiomegaly without radiographic evidence for significant edema. No other acute cardiopulmonary abnormality identified. Electronically Signed   By: Rise MuBenjamin  McClintock M.D.   On: 24-Sep-2015 23:33   Dg Chest Port 1 View  Result Date: 2016/04/29 CLINICAL DATA:  80 y/o F; nausea vomiting and diarrhea with pain starting at approximately 12 p.m. today. EXAM: PORTABLE CHEST 1 VIEW COMPARISON:  07/12/2013 chest radiograph. FINDINGS: Stable enlarged cardiac silhouette given differences in technique. Aortic atherosclerosis with calcifications of the arch. No focal  consolidation, pneumothorax, or pleural effusion. IMPRESSION: No active disease.  Stable cardiomegaly and aortic atherosclerosis. Electronically Signed   By: Mitzi HansenLance  Furusawa-Stratton M.D.   On: 24-Sep-2015 19:26     Assessment and Recommendation  80 y.o. female with essential hypertension mixed hyperlipidemia chronic kidney disease stage III with GI bleed which is exacerbated her paroxysmal nonvalvular atrial fibrillation now with rapid ventricular rate and unable to take oral medications for heart rate control needing diltiazem drip 1. Continue diltiazem drip until patient able to take adequate oral medication management for heart rate control 2. Metoprolol for heart rate control at 50 mg twice per day and further consideration of diltiazem 240 mg long-acting with this medication when able to take oral  3. No further addition of anticoagulation due to GI bleed Or. Begin ambulation and adjustments of medication management 5. No further cardiac diagnostics necessary at this time  Signed, Arnoldo HookerBruce Jonh Mcqueary M.D. FACC

## 2016-02-16 NOTE — Consult Note (Signed)
Midge Minium, MD [facility]  [address] [place], [place] [address] Phone: [phone] Fax : [phone]  Consultation  Referring Provider:     No ref. provider found Primary Care Physician:  No PCP Per Patient Primary Gastroenterologist:           Reason for Consultation:     Hematemesis and rectal bleeding  Date of Admission:  February 14, 2016 Date of Consultation:  02/16/2016         HPI:   [name] is a 80 y.o. female who is admitted with coffee ground emesis and hematochezia that started on the day of admission. The patient reports that she has had no further bleeding.  The patient has had atrial fibrillation with rapid ventricular rate.  The patient is also requiring oxygen and appears short of breath. The patient also states that her abdomen has  Become  more distended since being admitted. The patient also had a KUB done today  that shows her to have ileus with a distended stomach.  Past Medical History:  Diagnosis Date  . Atrial fibrillation (HCC)   . Diabetes mellitus without complication (HCC)   . Hypertension     Past Surgical History:  Procedure Laterality Date  . ABDOMINAL HYSTERECTOMY      Prior to Admission medications   Not on File    Family History  Problem Relation Age of Onset  . Diabetes Mother   . Kidney failure Mother   . Diabetes Father   . Kidney failure Father   . Stroke Father      Social History  Substance Use Topics  . Smoking status: Never Smoker  . Smokeless tobacco: Never Used  . Alcohol use No    Allergies as of Feb 14, 2016 - Review Complete 02-14-16  Allergen Reaction Noted  . Demerol [meperidine] Nausea And Vomiting 02-14-16  . Reglan [metoclopramide] Swelling 02-14-16  . Penicillins Swelling and Rash 02/14/2016    Review of Systems:    All systems reviewed and negative except where noted in HPI.   Physical Exam:  Vital signs in last 24 hours: Temp:  [97.4 F (36.3 C)-98 F (36.7 C)] 97.5 F (36.4 C)  (09/25 1505) Pulse Rate:  [104-146] 112 (09/25 2041) Resp:  [16-38] 20 (09/25 2041) BP: (77-134)/(58-95) 122/77 (09/25 2041) SpO2:  [93 %-100 %] 96 % (09/25 2041) Weight:  [305 lb 6.4 oz (138.5 kg)] 305 lb 6.4 oz (138.5 kg) (09/25 1505) Last BM Date: 02/13/16 General:   Pleasant, cooperative in NAD Head:  Normocephalic and atraumatic. Eyes:   No icterus.   Conjunctiva pink. PERRLA. Ears:  Normal auditory acuity. Neck:  Supple; no masses or thyroidomegaly Lungs: Tachypnea  Heart:  Irregular rate and rhythm;  Without murmur, clicks, rubs or gallops Abdomen:  Soft, distended, nontender. Decreased bowel sounds. No appreciable masses or hepatomegaly.  No rebound or guarding.  Tympanic Rectal:  Not performed. Msk:  Symmetrical without gross deformities.    Extremities:  Without edema, cyanosis or clubbing. Neurologic:  Alert and oriented x3;  grossly normal neurologically. Skin:  Intact without significant lesions or rashes. Cervical Nodes:  No significant cervical adenopathy. Psych:  Alert and cooperative. Normal affect.  LAB RESULTS:  Recent Labs  02/14/16 1906 02/15/16 0039 02/16/16 0411  WBC 11.1* 11.6* 9.9  HGB 12.9 11.9* 11.4*  HCT 39.1 34.5* 33.5*  PLT 169 171 155   BMET  Recent Labs  02/14/16 0055 02/15/16 0039 02/16/16 0411  NA 140 139 142  K 4.3 4.3  3.7  CL 114* 113* 112*  CO2 20* 17* 18*  GLUCOSE 226* 182* 196*  BUN 37* 52* 54*  CREATININE 1.40* 2.12* 1.97*  CALCIUM 7.8* 7.3* 7.0*   LFT No results for input(s): PROT, ALBUMIN, AST, ALT, ALKPHOS, BILITOT, BILIDIR, IBILI in the last 72 hours. PT/INR No results for input(s): LABPROT, INR in the last 72 hours.  STUDIES: Dg Abd 1 View  Result Date: 02/16/2016 CLINICAL DATA:  NG tube placement. EXAM: ABDOMEN - 1 VIEW COMPARISON:  Plain film from earlier same day. FINDINGS: Nasogastric tube is coiled on itself with tip directed upwards. Tip is above the upper margin of this abdomen x-ray. Again noted is gaseous  distention of the colon and upper abdominal small bowel, incompletely imaged. IMPRESSION: Nasogastric tube is coiled on itself within the esophagus. Tip is directed upwards and is located above the upper portion of this x-ray, at least above the level of the midesophagus. Recommend repositioning. These results were called by telephone at the time of interpretation on 02/16/2016 at 7:23 pm to the patient's nursewho verbally acknowledged these results. Electronically Signed   By: Bary RichardStan  Maynard M.D.   On: 02/16/2016 19:24   Dg Abd 1 View  Result Date: 02/16/2016 CLINICAL DATA:  80 y/o F; abdominal distention with diarrhea, abdominal pain, and emesis. EXAM: ABDOMEN - 1 VIEW COMPARISON:  02/03/2016 CT of the abdomen and pelvis. FINDINGS: There has been interval development of multiple dilated loops of small bowel which may represents ileus or downstream obstruction. Moderate gastric distention. Mild levocurvature and degenerative changes of lower lumbar spine. IMPRESSION: Interval development of multiple dilated loops of small bowel which may represent ileus or downstream obstruction. Moderate gastric distention. Electronically Signed   By: Mitzi HansenLance  Furusawa-Stratton M.D.   On: 02/16/2016 15:25   Dg Chest Port 1 View  Result Date: 02/16/2016 CLINICAL DATA:  80 year old female with cough. EXAM: PORTABLE CHEST 1 VIEW COMPARISON:  Chest radiograph dated 02/14/2016 and CT dated 07/06/2013 FINDINGS: Single portable view of the chest demonstrates clear lungs. There is no pleural effusion or pneumothorax. Stable cardiomegaly. There is degenerative changes of the spine and shoulders. No acute fracture. IMPRESSION: No acute cardiopulmonary process. Stable mild cardiomegaly. Electronically Signed   By: Elgie CollardArash  Radparvar M.D.   On: 02/16/2016 05:30      Impression / Plan:   [name] is a 80 y.o. y/o female with Admission with coffee ground emesis and rectal bleeding.  The patient has had no further episodes of  these.  The patient does have a KUB showing ileus.  The patient will be made nothing by mouth and a NG tube will be ordered.  The patient has been explained the plan and agrees with it.   Thank you for involving me in the care of this patient.      LOS: 4 days   Midge Miniumarren Keymon Mcelroy, MD  02/16/2016, 8:57 PM   Note: This dictation was prepared with Dragon dictation along with smaller phrase technology. Any transcriptional errors that result from this process are unintentional.

## 2016-02-16 NOTE — Progress Notes (Signed)
PHARMACY CONSULT NOTE  Pharmacy consulted to assist in electroylyte management in this [age] yoF admitted on 9/21 with GI bleed.   1. Electrolytes:  9/22 am labs K= 3.7; no magnesium level. Pt will receive K 10 mEq x 2 due to insulin drip therapy. Will check BMP and mag with am labs.   9/23: K=4.3  Mag=2.0. Patient no longer on Insulin drip. (Lantus, SSI). No supplementation needed currently. Will f/u with am labs.  9/24: K= 4.3 . No supplementation needed currently. Will f/u with am labs.  9/25: K=3.7. No supplementation needed currently. Will f/u with am labs.     Allergies  Allergen Reactions  . Demerol [Meperidine] Nausea And Vomiting  . Reglan [Metoclopramide] Swelling  . Penicillins Swelling and Rash    Swelling to arm at injection site. Has patient had a PCN reaction causing immediate rash, facial/tongue/throat swelling, SOB or lightheadedness with hypotension: Yes Has patient had a PCN reaction causing severe rash involving mucus membranes or skin necrosis: No Has patient had a PCN reaction that required hospitalization No Has patient had a PCN reaction occurring within the last 10 years: No If all of the above answers are "NO", then may proceed with Cephalosporin use.     Patient Measurements: Height: 5\' 4"  (162.6 cm) Weight: 225 lb (102.1 kg) IBW/kg (Calculated) : 54.7   Vital Signs: Temp: 97.5 F (36.4 C) (09/25 1505) Temp Source: Oral (09/25 1505) BP: 134/66 (09/25 1505) Pulse Rate: 126 (09/25 1505) Intake/Output from previous day: 09/24 0701 - 09/25 0700 In: 3048.9 [P.O.:240; I.V.:2158.9; IV Piggyback:650] Out: 1270 [Urine:1270] Intake/Output from this shift: Total I/O In: 458.3 [I.V.:458.3] Out: 875 [Urine:875]  Labs:  Recent Labs  02/14/16 1906 02/15/16 0039 02/16/16 0411  WBC 11.1* 11.6* 9.9  HGB 12.9 11.9* 11.4*  HCT 39.1 34.5* 33.5*  PLT 169 171 155     Recent Labs  02/14/16 0055 02/15/16 0039 02/16/16 0411  NA 140 139 142  K 4.3  4.3 3.7  CL 114* 113* 112*  CO2 20* 17* 18*  GLUCOSE 226* 182* 196*  BUN 37* 52* 54*  CREATININE 1.40* 2.12* 1.97*  CALCIUM 7.8* 7.3* 7.0*  MG 2.0  --   --    Estimated Creatinine Clearance: 27.4 mL/min (by C-G formula based on SCr of 1.97 mg/dL (H)).    Recent Labs  02/15/16 2108 02/16/16 0728 02/16/16 1148  GLUCAP 167* 198* 187*    Medications:  Scheduled:  . diltiazem  30 mg Oral Q6H  . heparin subcutaneous  5,000 Units Subcutaneous Q8H  . insulin aspart  0-5 Units Subcutaneous QHS  . insulin aspart  0-9 Units Subcutaneous TID WC  . methylPREDNISolone (SOLU-MEDROL) injection  40 mg Intravenous Q12H  . pantoprazole  40 mg Oral BID  . senna-docusate  1 tablet Oral BID  . sodium chloride  500 mL Intravenous Once  . sodium chloride flush  3 mL Intravenous Q12H   Infusions:  .  sodium bicarbonate 150 mEq in sterile water 1000 mL infusion 50 mL/hr at 02/16/16 0800    Delsa BernKelly m Fuhrmann, PharmD 4:40 PM 02/16/2016

## 2016-02-16 NOTE — Progress Notes (Signed)
[facility] - [place] [facility]   PATIENT NAME: [name] PerfectBobbie Oubre    MR#:  [id number]  DATE OF BIRTH:  06/20/1935  SUBJECTIVE:  CHIEF COMPLAINT:   Chief Complaint  Patient presents with  . Diarrhea  . Abdominal Pain  . Emesis   - HR still elevated, BP borderline -more alert, continues to have dyspnea, abd remains distended though not tender  REVIEW OF SYSTEMS:  Review of Systems  Constitutional: Positive for malaise/fatigue. Negative for chills and fever.  HENT: Negative for ear discharge, ear pain and tinnitus.   Eyes: Negative for blurred vision and double vision.  Respiratory: Positive for shortness of breath. Negative for cough and wheezing.   Cardiovascular: Negative for chest pain, palpitations and leg swelling.  Gastrointestinal: Negative for abdominal pain, constipation, diarrhea, nausea and vomiting.       Abdominal distention  Genitourinary: Negative for dysuria and urgency.  Musculoskeletal: Negative for myalgias.  Neurological: Negative for dizziness, sensory change, speech change, focal weakness, seizures and headaches.  Psychiatric/Behavioral: Negative for depression.    DRUG ALLERGIES:   Allergies  Allergen Reactions  . Demerol [Meperidine] Nausea And Vomiting  . Reglan [Metoclopramide] Swelling  . Penicillins Swelling and Rash    Swelling to arm at injection site. Has patient had a PCN reaction causing immediate rash, facial/tongue/throat swelling, SOB or lightheadedness with hypotension: Yes Has patient had a PCN reaction causing severe rash involving mucus membranes or skin necrosis: No Has patient had a PCN reaction that required hospitalization No Has patient had a PCN reaction occurring within the last 10 years: No If all of the above answers are "NO", then may proceed with Cephalosporin use.     VITALS:  Blood pressure 93/66, pulse (!) 109, temperature 97.4 F (36.3 C), temperature source Axillary, resp. rate (!) 27, height  5\' 4"  (1.626 m), weight 102.1 kg (225 lb), SpO2 98 %.  PHYSICAL EXAMINATION:  Physical Exam  GENERAL:  80 y.o.-year-old patient lying in the bed with no acute distress. Appears dyspneic at rest. EYES: Pupils equal, round, reactive to light and accommodation. No scleral icterus. Extraocular muscles intact.  HEENT: Head atraumatic, normocephalic. Oropharynx and nasopharynx clear.  NECK:  Supple, no jugular venous distention. No thyroid enlargement, no tenderness.  LUNGS: Normal breath sounds bilaterally, no wheezing, rales,rhonchi or crepitation. No use of accessory muscles of respiration. Decreased bibasilar breath sounds CARDIOVASCULAR: S1, S2 normal. No murmurs, rubs, or gallops.  ABDOMEN: Soft, obese, distended, non tender. Bowel sounds present. No organomegaly or mass.  EXTREMITIES: No pedal edema, cyanosis, or clubbing.  NEUROLOGIC: Cranial nerves II through XII are intact. Muscle strength 5/5 in all extremities. Sensation intact. Gait not checked.  PSYCHIATRIC: The patient is alert and oriented x 3.  SKIN: No obvious rash, lesion, or ulcer.    LABORATORY PANEL:   CBC  Recent Labs Lab 02/16/16 0411  WBC 9.9  HGB 11.4*  HCT 33.5*  PLT 155   ------------------------------------------------------------------------------------------------------------------  Chemistries   Recent Labs Lab 02/15/2016 1830  02/14/16 0055  02/16/16 0411  NA 138  < > 140  < > 142  K 3.6  < > 4.3  < > 3.7  CL 104  < > 114*  < > 112*  CO2 20*  < > 20*  < > 18*  GLUCOSE 446*  < > 226*  < > 196*  BUN 20  < > 37*  < > 54*  CREATININE 1.13*  < > 1.40*  < > 1.97*  CALCIUM 9.3  < > 7.8*  < > 7.0*  MG  --   < > 2.0  --   --   AST 22  --   --   --   --   ALT 16  --   --   --   --   ALKPHOS 68  --   --   --   --   BILITOT 0.8  --   --   --   --   < > = values in this interval not  displayed. ------------------------------------------------------------------------------------------------------------------  Cardiac Enzymes  Recent Labs Lab 02/11/2016 1830  TROPONINI <0.03   ------------------------------------------------------------------------------------------------------------------  RADIOLOGY:  Dg Chest 1 View  Result Date: 02/14/2016 CLINICAL DATA:  Fever EXAM: CHEST 1 VIEW COMPARISON:  02/11/2016 chest radiograph. FINDINGS: Stable cardiomediastinal silhouette with cardiomegaly and aortic atherosclerosis. No pneumothorax. No pleural effusion. No pulmonary edema. No acute consolidative airspace disease. IMPRESSION: Stable mild cardiomegaly without pulmonary edema. No acute pulmonary disease. Aortic atherosclerosis. Electronically Signed   By: Delbert Phenix M.D.   On: 02/14/2016 21:15   Ct Head Wo Contrast  Result Date: 02/14/2016 CLINICAL DATA:  Altered mental status.  Inpatient. EXAM: CT HEAD WITHOUT CONTRAST TECHNIQUE: Contiguous axial images were obtained from the base of the skull through the vertex without intravenous contrast. COMPARISON:  None. FINDINGS: Brain: No evidence of parenchymal hemorrhage or extra-axial fluid collection. No mass lesion, mass effect, or midline shift. No CT evidence of acute infarction. Intracranial atherosclerosis. Generalized cerebral volume loss. Nonspecific moderate subcortical and periventricular white matter hypodensity, most in keeping with chronic small vessel ischemic change. No ventriculomegaly. Vascular: No hyperdense vessel or unexpected calcification. Skull: No evidence of calvarial fracture. Sinuses/Orbits: The visualized paranasal sinuses are essentially clear. Other: Patient is edentulous. The mastoid air cells are unopacified. IMPRESSION: 1.  No evidence of acute intracranial abnormality. 2. Generalized cerebral volume loss and moderate chronic small vessel ischemia. Electronically Signed   By: Delbert Phenix M.D.   On:  02/14/2016 17:38   Dg Chest Port 1 View  Result Date: 02/16/2016 CLINICAL DATA:  80 year old female with cough. EXAM: PORTABLE CHEST 1 VIEW COMPARISON:  Chest radiograph dated 02/14/2016 and CT dated 07/06/2013 FINDINGS: Single portable view of the chest demonstrates clear lungs. There is no pleural effusion or pneumothorax. Stable cardiomegaly. There is degenerative changes of the spine and shoulders. No acute fracture. IMPRESSION: No acute cardiopulmonary process. Stable mild cardiomegaly. Electronically Signed   By: Elgie Collard M.D.   On: 02/16/2016 05:30    EKG:   Orders placed or performed during the hospital encounter of 02/20/2016  . EKG 12-Lead  . EKG 12-Lead    ASSESSMENT AND PLAN:   BobbieMilburnis a 80 y.o.femalewho presents with Acute onset abdominal pain with coffee-ground emesis and hematochezia, also had hyperglycemia, afib rvr and mental status changes  #1 Acute GI bleed - likely upper and lower both- gastritis and diverticulitis probably - stopped now, hb stable - CT of the abd with hiatal hernia and diverticulosis- could have gastritis - continue protonix- change to oral bid, no active rectal bleeding noted now - no further nausea or vomiting- tolerating diet - GI consulted on admission  #2 .Atrial fibrillation with RVR (HCC) - BP remains low, off cardizem drip,  - low dose cardizem started -no anticoagulation due to GI bleed (pt was on eliquis in the past-she stopped it several months ago)  #3 UncontrolledDiabetes [place])  Due to medication non compliance on admission -oral meds - glipizide,metformin  and victoza (per [facility] out pt notes)- on hold - sugars have been ok since admission as hasnt been eating much - a1c 11.9, on SSI  #4. Dyspnea on exam- stable sats, ABG with hypoxia  ? Chronic deconditioning, monitor while on IV fluids - CXR with cardiomegaly, but no pulm edema- closely monitor - ECHo normal - abdominal distention- check KUB and abd  [place]  #5 Sepsis- ruled out, only 1 spike of temp- cultures are negative - discontinue ABX - abd is distended, - CT abd on adm without any acute findings- monitor closely- KUB and abd [place]   #6 ARF- ATN from sepsis, IV fluids Maintain BP MAP>65, kidneys on the CT abd without any acute findings - avoid nephrotoxins, monitor  #7. DVT Prophylaxis- TEDs and SCDs   Patient is alert oriented now, discussed code status- wants to be full code Also family situation not very friendly, not sure if can safely return home- social worker consult Physical Therapy consult   All the records are reviewed and case discussed with Care Management/Social Workerr. Management plans discussed with the patient, family and they are in agreement.  CODE STATUS: Full Code  TOTAL CRITICAL CARE TIME TAKING CARE OF THIS PATIENT: 37 minutes.   POSSIBLE D/C IN 2-3 DAYS, DEPENDING ON CLINICAL CONDITION.   Enid Baas M.D on 02/16/2016 at 2:11 PM  Between 7am to 6pm - Pager - [phone]  After 6pm go to [email or web address] - Social research officer, government  [facility]  Office  [phone]  CC: Primary care physician; No PCP Per Patient

## 2016-02-17 ENCOUNTER — Inpatient Hospital Stay: Payer: Commercial Managed Care - HMO

## 2016-02-17 LAB — BASIC METABOLIC PANEL
ANION GAP: 17 — AB (ref 5–15)
BUN: 60 mg/dL — ABNORMAL HIGH (ref 6–20)
CALCIUM: 7.5 mg/dL — AB (ref 8.9–10.3)
CHLORIDE: 107 mmol/L (ref 101–111)
CO2: 17 mmol/L — AB (ref 22–32)
Creatinine, Ser: 2.01 mg/dL — ABNORMAL HIGH (ref 0.44–1.00)
GFR calc non Af Amer: 23 mL/min — ABNORMAL LOW (ref >60)
GFR, EST AFRICAN AMERICAN: 26 mL/min — AB (ref >60)
GLUCOSE: 278 mg/dL — AB (ref 65–99)
POTASSIUM: 3.6 mmol/L (ref 3.5–5.1)
Sodium: 141 mmol/L (ref 135–145)

## 2016-02-17 LAB — CULTURE, BLOOD (ROUTINE X 2)
CULTURE: NO GROWTH
Culture: NO GROWTH

## 2016-02-17 LAB — GLUCOSE, CAPILLARY
GLUCOSE-CAPILLARY: 284 mg/dL — AB (ref 65–99)
Glucose-Capillary: 305 mg/dL — ABNORMAL HIGH (ref 65–99)
Glucose-Capillary: 293 mg/dL — ABNORMAL HIGH (ref 65–99)
Glucose-Capillary: 297 mg/dL — ABNORMAL HIGH (ref 65–99)

## 2016-02-17 LAB — CBC
HEMATOCRIT: 32.5 % — AB (ref 35.0–47.0)
HEMOGLOBIN: 10.8 g/dL — AB (ref 12.0–16.0)
MCH: 30.9 pg (ref 26.0–34.0)
MCHC: 33.3 g/dL (ref 32.0–36.0)
MCV: 92.8 fL (ref 80.0–100.0)
Platelets: 168 10*3/uL (ref 150–440)
RBC: 3.51 MIL/uL — AB (ref 3.80–5.20)
RDW: 14.5 % (ref 11.5–14.5)
WBC: 8.7 10*3/uL (ref 3.6–11.0)

## 2016-02-17 MED ORDER — DILTIAZEM HCL 25 MG/5ML IV SOLN
10.0000 mg | Freq: Once | INTRAVENOUS | Status: AC
Start: 2016-02-17 — End: 2016-02-17
  Administered 2016-02-17: 10 mg via INTRAVENOUS
  Filled 2016-02-17: qty 5

## 2016-02-17 MED ORDER — DEXTROSE 5 % IV SOLN
5.0000 mg/h | INTRAVENOUS | Status: DC
  Administered 2016-02-17 – 2016-02-18 (×2): 5 mg/h via INTRAVENOUS
  Filled 2016-02-17 (×4): qty 100

## 2016-02-17 MED ORDER — METOPROLOL TARTRATE 5 MG/5ML IV SOLN
5.0000 mg | Freq: Four times a day (QID) | INTRAVENOUS | Status: DC | PRN
  Administered 2016-02-17 – 2016-02-21 (×4): 5 mg via INTRAVENOUS
  Filled 2016-02-17 (×4): qty 5

## 2016-02-17 MED ORDER — METHYLPREDNISOLONE SODIUM SUCC 40 MG IJ SOLR
40.0000 mg | Freq: Every day | INTRAMUSCULAR | Status: DC
  Administered 2016-02-18: 40 mg via INTRAVENOUS
  Filled 2016-02-17: qty 1

## 2016-02-17 NOTE — Care Management (Signed)
Patient moved out of icu to 2A 9/25.  GI service ws not available until 9/25 and consult has been completed.  KUB from 9/25 shows ileus.  Patient is now npo with nasogastric tube.  Heart rate is stable.

## 2016-02-17 NOTE — Progress Notes (Signed)
Dr. Nemiah Commanderkalisetti made aware of increased heart rate, new orders received.

## 2016-02-17 NOTE — Progress Notes (Signed)
PT Cancellation Note  Patient Details Name: [name] MRN: [id number] DOB: 03/10/1936   Cancelled Treatment:    Reason Eval/Treat Not Completed: Other (comment) Pt HR ranging from 117 bpm to 135 bpm on the monitor. Consulted RN Kenney Housemananya and reports that the pt will be started on medications soon to assist HR. PT will return at a later date/time to attempt evaluation when pt medically stable.    Albertina SenegalMeagan Zyla Dascenzo, SPT 02/17/2016, 12:19 PM

## 2016-02-17 NOTE — Progress Notes (Signed)
Diltiazem gtt started at 5mg /hr, will montior pt.

## 2016-02-17 NOTE — Care Management (Signed)
Have made attempt to assess patient and speak with family.  Patient and family off the unit.  Attending verbalizes concern regarding patient home environment and caregiver support.

## 2016-02-17 NOTE — Progress Notes (Signed)
Dr. Nemiah CommanderKalisetti page regarding pt's heart rate of 120-140, new orders received.

## 2016-02-17 NOTE — Progress Notes (Signed)
Diltiazem 10mg  IV given per md orders, will monitor.

## 2016-02-17 NOTE — Progress Notes (Signed)
Patient remained NPO overnight, NGT was replaced twice during the shift and were confirmed with chest xray. Patient remained in asymptomatic afib.

## 2016-02-17 NOTE — Care Management (Addendum)
Spoke with patient, her son [name] and daughter [name].  Patient resides with her children.  Patient and her son really that patient is able to walk in the home independently and does have access to a front wheeled rolling walker but does not always use it.  It is very difficult for the patient get get in and out of the garden tub. She has a scooter chair but the battery is dead and "the scooter store closed down."  Patient acknowledges that she stopped taking her medications and insulin.  she thought it was making her nauseated and giving her diarrhea.  She verbally acknowledges  in front of her son and daughter that this was not a wise decision and she intends on taking her medication now.  She use to have a cpap at home but Festus Holtspia took it back.  It has been a couple of years.  Contacted [employer].  In 2013, patient returned her cpap machine because she was not using it.  02 concentrator was also sent back.  Patient and her sone verbalize that in order to obtain another cpap, she will have to re qualify with another sleep study.  Says that Dr Marcello FennelHande (her pcp) ordered the last one.  Disucssed that prior to discharge, patient will be assessed for need of home 02.  Discussed discharge options- SNF- patient says she wants to go home and right now will not consider snf.  She is very agreeable to home health SN PT Aide and social work. There have been concerns verbalized about patient's home environment and social issues.  Patient has medicaid and sw can assist with completing referral for medicaid pcs and identification of community resources that may assist this family.  Provided patient and family with a list of home health agencies.  Patient says she is current with her pcp Dr Marcello FennelHande and saw him 6 months ago.  Started cardizem drip for tachycardia.  PT consult pending stabilization of heart rate.  She currently has nasogastric tube and npo for ileus

## 2016-02-17 NOTE — Progress Notes (Signed)
NGT clamped to tranport to Ultrasound via bed

## 2016-02-17 NOTE — Progress Notes (Signed)
Inpatient Diabetes Program Recommendations  AACE/ADA: New Consensus Statement on Inpatient Glycemic Control (2015)  Target Ranges:  Prepandial:   less than 140 mg/dL      Peak postprandial:   less than 180 mg/dL (1-2 hours)      Critically ill patients:  140 - 180 mg/dL  Results for [name] CarrelMILBURN, [name] (MRN [id number]) as of 02/17/2016 08:25  Ref. Range 02/16/2016 07:28 02/16/2016 11:48 02/16/2016 16:57 02/16/2016 21:17 02/17/2016 07:25  Glucose-Capillary Latest Ref Range: 65 - 99 mg/dL 409198 (H) 811187 (H) 914217 (H) 245 (H) 284 (H)    Review of Glycemic Control  Current orders for Inpatient glycemic control: Novolog 0-9 units TID with meals, Novolog 0-5 units QHS  Inpatient Diabetes Program Recommendations Insulin - Basal: Glucose ranged from 187-284 mg/dl over the past 24 hours, patient is NPO, and ordered Solumedrol 40 mg Q12H. Please consider ordering Lantus 7 units Q24H (based on 138 kg x 0.05). Insulin-Correction: Please consider changing frequency of CBGs and Novolog to Q4H.  Thanks, Orlando PennerMarie Shadrack Brummitt, RN, MSN, CDE Diabetes Coordinator Inpatient Diabetes Program [phone] (Team Pager from 8am to 5pm) [phone] ([facility] office) [phone] [facility] office) [phone] [facility] office)

## 2016-02-17 NOTE — Progress Notes (Signed)
NTG reconnected to LIWS.

## 2016-02-17 NOTE — Progress Notes (Signed)
Back from ultrasound, AM medications given, will leave NG clamped x 30 minutes

## 2016-02-17 NOTE — Care Management Important Message (Signed)
Important Message  Patient Details  Name: [name] MRN: [id number] Date of Birth: 09/03/1935   Medicare Important Message Given:  Yes    Eber HongGreene, [place] R, RN 02/17/2016, 1:51 PM

## 2016-02-17 NOTE — Progress Notes (Signed)
[facility] [place] [facility]   PATIENT NAME: [name]    MR#:  [id number]  DATE OF BIRTH:  12/21/1935  SUBJECTIVE:  CHIEF COMPLAINT:   Chief Complaint  Patient presents with  . Diarrhea  . Abdominal Pain  . Emesis   - HR still elevated, KUB with ileus and NG tube placed and almost 1400cc drained since last evening - for abd US today- restart cardizem drip as HR elevated  REVIEW OF SYSTEMS:  Review of Systems  Constitutional: Positive for malaise/fatigue. Negative for chills and fever.  HENT: Negative for ear discharge, ear pain and tinnitus.   Eyes: Negative for blurred vision and double vision.  Respiratory: Positive for shortness of breath. Negative for cough and wheezing.   Cardiovascular: Positive for palpitations. Negative for chest pain and leg swelling.  Gastrointestinal: Negative for abdominal pain, constipation, diarrhea, nausea and vomiting.       Abdominal distention  Genitourinary: Negative for dysuria and urgency.  Musculoskeletal: Negative for myalgias.  Neurological: Negative for dizziness, sensory change, speech change, focal weakness, seizures and headaches.  Psychiatric/Behavioral: Negative for depression.    DRUG ALLERGIES:   Allergies  Allergen Reactions  . Demerol [Meperidine] Nausea And Vomiting  . Reglan [Metoclopramide] Swelling  . Penicillins Swelling and Rash    Swelling to arm at injection site. Has patient had a PCN reaction causing immediate rash, facial/tongue/throat swelling, SOB or lightheadedness with hypotension: Yes Has patient had a PCN reaction causing severe rash involving mucus membranes or skin necrosis: No Has patient had a PCN reaction that required hospitalization No Has patient had a PCN reaction occurring within the last 10 years: No If all of the above answers are "NO", then may proceed with Cephalosporin use.     VITALS:  Blood pressure 117/62, pulse 94, temperature 98 F (36.7 C),  temperature source Oral, resp. rate 20, height 5\' 7"  (1.702 m), weight (!) 138.5 kg (305 lb 6.4 oz), SpO2 96 %.  PHYSICAL EXAMINATION:  Physical Exam  GENERAL:  80 y.o.-year-old patient lying in the bed with no acute distress.  EYES: Pupils equal, round, reactive to light and accommodation. No scleral icterus. Extraocular muscles intact.  HEENT: Head atraumatic, normocephalic. Oropharynx and nasopharynx clear. NG tube in place with continuous suction NECK:  Supple, no jugular venous distention. No thyroid enlargement, no tenderness.  LUNGS: Normal breath sounds bilaterally, no wheezing, rales,rhonchi or crepitation. No use of accessory muscles of respiration. Decreased bibasilar breath sounds CARDIOVASCULAR: S1, S2 normal. No murmurs, rubs, or gallops.  ABDOMEN: Soft, obese, distended, non tender. Bowel sounds present. No organomegaly or mass.  EXTREMITIES: No pedal edema, cyanosis, or clubbing.  NEUROLOGIC: Cranial nerves II through XII are intact. Muscle strength 5/5 in all extremities. Sensation intact. Gait not checked.  PSYCHIATRIC: The patient is alert and oriented x 3.  SKIN: No obvious rash, lesion, or ulcer.    LABORATORY PANEL:   CBC  Recent Labs Lab 02/17/16 0417  WBC 8.7  HGB 10.8*  HCT 32.5*  PLT 168   ------------------------------------------------------------------------------------------------------------------  Chemistries   Recent Labs Lab 02/15/2016 1830  02/14/16 0055  02/17/16 0417  NA 138  < > 140  < > 141  K 3.6  < > 4.3  < > 3.6  CL 104  < > 114*  < > 107  CO2 20*  < > 20*  < > 17*  GLUCOSE 446*  < > 226*  < > 278*  BUN 20  < >  37*  < > 60*  CREATININE 1.13*  < > 1.40*  < > 2.01*  CALCIUM 9.3  < > 7.8*  < > 7.5*  MG  --   < > 2.0  --   --   AST 22  --   --   --   --   ALT 16  --   --   --   --   ALKPHOS 68  --   --   --   --   BILITOT 0.8  --   --   --   --   < > = values in this interval not  displayed. ------------------------------------------------------------------------------------------------------------------  Cardiac Enzymes  Recent Labs Lab 2016-02-18 1830  TROPONINI <0.03   ------------------------------------------------------------------------------------------------------------------  RADIOLOGY:  Dg Abd 1 View  Result Date: 02/17/2016 CLINICAL DATA:  80 year old female with enteric tube placement. EXAM: ABDOMEN - 1 VIEW COMPARISON:  Abdominal radiograph dated 02/16/2016 and CT dated 02/18/2016 FINDINGS: An enteric tube is noted with tip in stable positioning below the level of the diaphragm likely in the proximal stomach just distal to the gastroesophageal junction. Multiple dilated and air distended loops of small bowel again noted throughout the abdomen measuring up to 5.2 cm in the left lower quadrant. No definite free air identified. No radiopaque calculi. The soft tissues and the osseous structures are grossly unremarkable. IMPRESSION: Enteric tube in stable positioning in the proximal stomach. Air distended stomach with persistent dilatation of small-bowel loops. Follow-up recommended. Electronically Signed   By: Elgie Collard M.D.   On: 02/17/2016 03:26   Dg Abd 1 View  Result Date: 02/16/2016 CLINICAL DATA:  Encounter for nasogastric tube placement EXAM: ABDOMEN - 1 VIEW COMPARISON:  02/16/2016 FINDINGS: The nasogastric tube tip is below the GE junction. The side port appears to be above the GE junction. Gaseous distension of the stomach and visualized small bowel loops noted. IMPRESSION: 1. Tip of the nasogastric tube is just below the GE junction within the proximal stomach. Electronically Signed   By: Signa Kell M.D.   On: 02/16/2016 21:40   Dg Abd 1 View  Result Date: 02/16/2016 CLINICAL DATA:  NG tube placement. EXAM: ABDOMEN - 1 VIEW COMPARISON:  Plain film from earlier same day. FINDINGS: Nasogastric tube is coiled on itself with tip directed  upwards. Tip is above the upper margin of this abdomen x-ray. Again noted is gaseous distention of the colon and upper abdominal small bowel, incompletely imaged. IMPRESSION: Nasogastric tube is coiled on itself within the esophagus. Tip is directed upwards and is located above the upper portion of this x-ray, at least above the level of the midesophagus. Recommend repositioning. These results were called by telephone at the time of interpretation on 02/16/2016 at 7:23 pm to the patient's nursewho verbally acknowledged these results. Electronically Signed   By: Bary Richard M.D.   On: 02/16/2016 19:24   Dg Abd 1 View  Result Date: 02/16/2016 CLINICAL DATA:  80 y/o F; abdominal distention with diarrhea, abdominal pain, and emesis. EXAM: ABDOMEN - 1 VIEW COMPARISON:  Feb 18, 2016 CT of the abdomen and pelvis. FINDINGS: There has been interval development of multiple dilated loops of small bowel which may represents ileus or downstream obstruction. Moderate gastric distention. Mild levocurvature and degenerative changes of lower lumbar spine. IMPRESSION: Interval development of multiple dilated loops of small bowel which may represent ileus or downstream obstruction. Moderate gastric distention. Electronically Signed   By: Mitzi Hansen M.D.   On: 02/16/2016 15:25  US Abdomen Complete  Result Date: 02/17/2016 CLINICAL DATA:  Abdominal distention for 3 days. EXAM: ABDOMEN ULTRASOUND COMPLETE COMPARISON:  None. FINDINGS: Gallbladder: No gallstones or wall thickening visualized. No sonographic Murphy sign noted by sonographer. Probable mild amount of sludge is noted within gallbladder lumen. Common bile duct: Diameter: 6.5 mm which is within normal limits. Liver: No focal lesion identified. Increased echogenicity is noted consistent with fatty infiltration. IVC: No abnormality visualized. Pancreas: Not visualized due to overlying bowel gas. Spleen: Size and appearance within normal limits. Right Kidney:  Length: 11.1 cm. Echogenicity within normal limits. No mass or hydronephrosis visualized. Left Kidney: Length: 11.8 cm. Echogenicity within normal limits. No mass or hydronephrosis visualized. Abdominal aorta: No aneurysm visualized. Other findings: None. IMPRESSION: Pancreas not visualized due due to overlying bowel gas. Fatty infiltration of the liver is noted. Probable gallbladder sludge noted. Electronically Signed   By: Lupita Raider, M.D.   On: 02/17/2016 10:12   Dg Chest Port 1 View  Result Date: 02/16/2016 CLINICAL DATA:  80 year old female with cough. EXAM: PORTABLE CHEST 1 VIEW COMPARISON:  Chest radiograph dated 02/14/2016 and CT dated 07/06/2013 FINDINGS: Single portable view of the chest demonstrates clear lungs. There is no pleural effusion or pneumothorax. Stable cardiomegaly. There is degenerative changes of the spine and shoulders. No acute fracture. IMPRESSION: No acute cardiopulmonary process. Stable mild cardiomegaly. Electronically Signed   By: Elgie Collard M.D.   On: 02/16/2016 05:30    EKG:   Orders placed or performed during the hospital encounter of 02/28/2016  . EKG 12-Lead  . EKG 12-Lead    ASSESSMENT AND PLAN:   BobbieMilburnis a 80 y.o.femalewho presents with Acute onset abdominal pain with coffee-ground emesis and hematochezia, also had hyperglycemia, afib rvr and mental status changes  #1 Acute GI bleed - likely upper and lower both- gastritis and diverticulitis probably - stopped now, hb stable. Appreciate GI consult - CT of the abd with hiatal hernia and diverticulosis- could have gastritis - continue protonix- change to oral bid, no active rectal bleeding noted now  #2 Ileus- with abdominal distention, NG tube placed - continue continuous suction  #3 .Atrial fibrillation with RVR (HCC) - restarted cardizem drip,  - likely not absorbing oral meds -no anticoagulation due to GI bleed (pt was on eliquis in the past-she stopped it several months  ago)  #4 UncontrolledDiabetes (HCC)  Due to medication non compliance on admission -NPO currently - a1c 11.9, on SSI  #5 ARF- ATN from sepsis,decrease IV fluids rate as starting to notice some swelling - cr plateaued,  kidneys on the CT abd without any acute findings - avoid nephrotoxins, monitor  #6. DVT Prophylaxis- TEDs and SCDs SQ heparin   Patient is alert oriented now, discussed code status- wants to be full code Also family situation not very friendly, not sure if can safely return home- social worker consult Physical Therapy consult   All the records are reviewed and case discussed with Care Management/Social Workerr. Management plans discussed with the patient, family and they are in agreement.  CODE STATUS: Full Code  TOTAL TIME TAKING CARE OF THIS PATIENT: .   POSSIBLE D/C IN 2-3 DAYS, DEPENDING ON CLINICAL CONDITION.   Enid Baas M.D on 02/17/2016 at 1:18 PM  Between 7am to 6pm - Pager - [phone]  After 6pm go to [email or web address] - Social research officer, government  [facility]  Office  [phone]  CC: Primary care physician; No PCP Per Patient

## 2016-02-17 NOTE — Progress Notes (Signed)
Metoprolol 5mg  IV given for increased HR 115-130, BP 136/69.  Will continue to monitor.

## 2016-02-18 ENCOUNTER — Inpatient Hospital Stay: Payer: Commercial Managed Care - HMO

## 2016-02-18 LAB — GLUCOSE, CAPILLARY
GLUCOSE-CAPILLARY: 271 mg/dL — AB (ref 65–99)
Glucose-Capillary: 365 mg/dL — ABNORMAL HIGH (ref 65–99)
Glucose-Capillary: 306 mg/dL — ABNORMAL HIGH (ref 65–99)
Glucose-Capillary: 246 mg/dL — ABNORMAL HIGH (ref 65–99)

## 2016-02-18 LAB — BASIC METABOLIC PANEL
ANION GAP: 14 (ref 5–15)
BUN: 65 mg/dL — ABNORMAL HIGH (ref 6–20)
CALCIUM: 7.7 mg/dL — AB (ref 8.9–10.3)
CO2: 25 mmol/L (ref 22–32)
CREATININE: 1.63 mg/dL — AB (ref 0.44–1.00)
Chloride: 105 mmol/L (ref 101–111)
GFR, EST AFRICAN AMERICAN: 34 mL/min — AB (ref >60)
GFR, EST NON AFRICAN AMERICAN: 29 mL/min — AB (ref >60)
Glucose, Bld: 347 mg/dL — ABNORMAL HIGH (ref 65–99)
Potassium: 3.6 mmol/L (ref 3.5–5.1)
Sodium: 144 mmol/L (ref 135–145)

## 2016-02-18 MED ORDER — INSULIN ASPART 100 UNIT/ML ~~LOC~~ SOLN
0.0000 [IU] | Freq: Four times a day (QID) | SUBCUTANEOUS | Status: AC
  Administered 2016-02-18: 5 [IU] via SUBCUTANEOUS
  Administered 2016-02-19 (×2): 3 [IU] via SUBCUTANEOUS
  Administered 2016-02-20 – 2016-02-21 (×4): 2 [IU] via SUBCUTANEOUS
  Administered 2016-02-21: 3 [IU] via SUBCUTANEOUS
  Filled 2016-02-18 (×2): qty 2
  Filled 2016-02-18 (×2): qty 3
  Filled 2016-02-18: qty 2
  Filled 2016-02-18: qty 3
  Filled 2016-02-18 (×2): qty 2
  Filled 2016-02-18: qty 5
  Filled 2016-02-18: qty 2

## 2016-02-18 MED ORDER — PREDNISONE 20 MG PO TABS
40.0000 mg | ORAL_TABLET | Freq: Every day | ORAL | Status: DC
  Administered 2016-02-19 – 2016-02-23 (×5): 40 mg via ORAL
  Filled 2016-02-18 (×5): qty 2

## 2016-02-18 MED ORDER — METOPROLOL TARTRATE 25 MG PO TABS
25.0000 mg | ORAL_TABLET | Freq: Four times a day (QID) | ORAL | Status: DC
  Administered 2016-02-18 – 2016-02-20 (×8): 25 mg via ORAL
  Filled 2016-02-18 (×8): qty 1

## 2016-02-18 MED ORDER — INSULIN GLARGINE 100 UNIT/ML ~~LOC~~ SOLN
12.0000 [IU] | Freq: Every day | SUBCUTANEOUS | Status: DC
  Administered 2016-02-18: 12 [IU] via SUBCUTANEOUS
  Filled 2016-02-18: qty 0.12

## 2016-02-18 MED ORDER — INSULIN GLARGINE 100 UNIT/ML ~~LOC~~ SOLN
12.0000 [IU] | Freq: Two times a day (BID) | SUBCUTANEOUS | Status: DC
  Administered 2016-02-18 – 2016-02-19 (×2): 12 [IU] via SUBCUTANEOUS
  Filled 2016-02-18 (×3): qty 0.12

## 2016-02-18 NOTE — Care Management (Signed)
Patient did not need anything presently. We completed an Advance Directive yesterday with her and her family. Chaplain just touched basis while performing rounds.

## 2016-02-18 NOTE — Progress Notes (Signed)
Foley catheter discontinued at this time,will monitor closely for voiding.

## 2016-02-18 NOTE — Progress Notes (Signed)
PT worked with patient today,NGT insitu to low intermittent suction with minimal output,remains NPO except ice chips,remains on 2 liters of oxygen,cardizem drip infusing at 5 mg/hr and maintaining heart rate in the 100's to 120,metoprolol also added,sodium bicarbonate drip also infusing.

## 2016-02-18 NOTE — Evaluation (Signed)
Physical Therapy Evaluation Patient Details Name: [name] MRN: [id number] DOB: 03/08/1936 Today's Date: 02/18/2016   History of Present Illness  Pt is a 80 y/o female presenting with acute onset abdominal pain with coffee ground emesis, nausea, and hematochezia. Pt admitted for GI bleed, and A-fib with RVR. Pt had a rapid response on 02/14/16 due to being unable to speak or move extremities and trasnferred to ICU. Pt on 02/17/16 had NGT placed due to having an ileus with distended stomach. PMH of A-fib, DM, HTN, and chronic kidney disease with essential HTN per cardiology report.   Clinical Impression  Pt at baseline lives in a mobile home with her children and has a ramped entrance into the home. Pt was ambulating in home distances using a 4 wheeled walker. Pt reports requiring assistance for homemaking however is able to dress and bathe herself without assistance. O2 throughout session 94% or greater on 2 L/min nasal cannula. Pt at this time is able to complete supine exercises without assistance and without significant change in HR . Pt requires +2 mod assist to transfer supine to sit and able to sit EOB CGA for approximately 5 min. Throughout that time HR was fluctuating between 125-135 bpm with the highest HR noted at 147 bpm. Pt quickly returned to supine position with +2 max assist and HR immediately returned to 107-112 bpm. Pt will benefit from continued PT to improve functional mobility as pt is medically able.     Follow Up Recommendations SNF    Equipment Recommendations  Rolling walker with 5" wheels (pending progress)    Recommendations for Other Services       Precautions / Restrictions Precautions Precautions: Fall (Gastric tube on low intermittent suction.) Restrictions Weight Bearing Restrictions: No      Mobility  Bed Mobility Overal bed mobility: +2 for physical assistance;Needs Assistance Bed Mobility: Supine to Sit;Sit to Supine     Supine to sit: Mod  assist;+2 for physical assistance Sit to supine: Max assist;+2 for physical assistance   General bed mobility comments: Increased SOB with sitting EOB; fatigued with approximately 5 minutes sitting EOB; pt returned to supine when HR reached 147 bpm  Transfers Overall transfer level:  (deferred at this time)                  Ambulation/Gait Ambulation/Gait assistance:  (deferred at this time)              Stairs            Wheelchair Mobility    Modified Rankin (Stroke Patients Only)       Balance Overall balance assessment: Needs assistance Sitting-balance support: Bilateral upper extremity supported;Feet unsupported Sitting balance-Leahy Scale: Fair Sitting balance - Comments: +1 min guard for safety only; Pt relying on bed rail for balance   Standing balance support:  (unable to assess)                                 Pertinent Vitals/Pain Pain Assessment: No/denies pain      Home Living Family/patient expects to be discharged to:: Private residence Living Arrangements: Children Available Help at Discharge: Family;Available 24 hours/day Type of Home: Mobile home Home Access: Ramped entrance (with rails)     Home Layout: One level Home Equipment: Walker - 4 wheels      Prior Function Level of Independence: Needs assistance   Gait / Transfers Assistance Needed: independent  with assistive devices short distances  ADL's / Homemaking Assistance Needed: bathes and dresses self "more or less" indepependently, needs assistance for homemaking        Hand Dominance   Dominant Hand: Right    Extremity/Trunk Assessment   Upper Extremity Assessment: Generalized weakness (Unable to pull and push self into a seated position)           Lower Extremity Assessment: Generalized weakness (At least 3-/5; pt able to complete supine B LE exercises full range independently)         Communication   Communication: No difficulties   Cognition Arousal/Alertness: Awake/alert Behavior During Therapy: WFL for tasks assessed/performed Overall Cognitive Status: Within Functional Limits for tasks assessed                      General Comments General comments (skin integrity, edema, etc.): Pt agreeable to PT session.     Exercises General Exercises - Lower Extremity Ankle Circles/Pumps: AROM;Strengthening;15 reps;Supine Quad Sets: AROM;Strengthening;Both;10 reps;Supine Gluteal Sets: AROM;Strengthening;Both;10 reps;Supine Short Arc Quad: AROM;Strengthening;Both;10 reps;Supine Heel Slides: AROM;Strengthening;Both;10 reps;Supine   Assessment/Plan    PT Assessment Patient needs continued PT services  PT Problem List Decreased strength;Decreased activity tolerance;Decreased balance;Decreased mobility;Cardiopulmonary status limiting activity          PT Treatment Interventions Gait training;DME instruction;Therapeutic activities;Therapeutic exercise;Balance training;Patient/family education    PT Goals (Current goals can be found in the Care Plan section)  Acute Rehab PT Goals Patient Stated Goal: To go home.  PT Goal Formulation: With patient Time For Goal Achievement: 03/03/16 Potential to Achieve Goals: Fair    Frequency Min 2X/week   Barriers to discharge        Co-evaluation               End of Session Equipment Utilized During Treatment: Gait belt;Oxygen (2 L/min) Activity Tolerance: Patient limited by fatigue (Limited from elevating HR) Patient left: in bed;with call bell/phone within reach;with bed alarm set;with family/visitor present Nurse Communication: Mobility status (HR during session)         Time: 8657-8469 PT Time Calculation (min) (ACUTE ONLY): 33 min   Charges:         PT G Codes:        Rawlin Reaume, SPT 02/18/2016, 4:40 PM

## 2016-02-18 NOTE — Progress Notes (Signed)
Inpatient Diabetes Program Recommendations  AACE/ADA: New Consensus Statement on Inpatient Glycemic Control (2015)  Target Ranges:  Prepandial:   less than 140 mg/dL      Peak postprandial:   less than 180 mg/dL (1-2 hours)      Critically ill patients:  140 - 180 mg/dL  Results for [name], [name] (MRN [id number]) as of 02/18/2016 08:01  Ref. Range 02/18/2016 03:34  Glucose Latest Ref Range: 65 - 99 mg/dL 409347 (H)   Results for [name], [name] (MRN [id number]) as of 02/18/2016 08:01  Ref. Range 02/17/2016 07:25 02/17/2016 11:43 02/17/2016 16:48 02/17/2016 20:39  Glucose-Capillary Latest Ref Range: 65 - 99 mg/dL 956284 (H) 213305 (H) 086293 (H) 297 (H)  Results for [name], [name] (MRN [id number]) as of 02/18/2016 08:01  Ref. Range 02/16/2016 07:28 02/16/2016 11:48 02/16/2016 16:57 02/16/2016 21:17  Glucose-Capillary Latest Ref Range: 65 - 99 mg/dL 528198 (H) 413187 (H) 244217 (H) 245 (H)    Review of Glycemic Control  Current orders for Inpatient glycemic control: Novolog 0-9 units TID with meals, Novolog 0-5 units QHS  Inpatient Diabetes Program Recommendations Insulin - Basal: Glucose ranged from 284-305 mg/dl on 0/10/279/26/17, fasting lab glucose 347 mg/dl today,patient is NPO, and ordered Solumedrol 40 mg daily. Please consider ordering Lantus 10 units Q24H (starting now). Insulin-Correction: Please consider changing frequency of CBGs and Novolog to Q4H.  Thanks, Orlando PennerMarie Kermit Arnette, RN, MSN, CDE Diabetes Coordinator Inpatient Diabetes Program [phone] (Team Pager from 8am to 5pm) [phone] ([facility] office) [phone] [facility] office) [phone] [facility] office)

## 2016-02-18 NOTE — Progress Notes (Signed)
[facility] - [place] at [facility]   PATIENT NAME: [name]    MR#:  [id number]  DATE OF BIRTH:  April 16, 1936  SUBJECTIVE:  CHIEF COMPLAINT:   Chief Complaint  Patient presents with  . Diarrhea  . Abdominal Pain  . Emesis   - HR still elevated, KUB with ileus and NG tube drainage decreased - remains on cardizem drip, feels a little stronger  REVIEW OF SYSTEMS:  Review of Systems  Constitutional: Positive for malaise/fatigue. Negative for chills and fever.  HENT: Negative for ear discharge, ear pain and tinnitus.   Eyes: Negative for blurred vision and double vision.  Respiratory: Positive for shortness of breath. Negative for cough and wheezing.   Cardiovascular: Negative for chest pain, palpitations and leg swelling.  Gastrointestinal: Negative for abdominal pain, constipation, diarrhea, nausea and vomiting.       Abdominal distention  Genitourinary: Negative for dysuria and urgency.  Musculoskeletal: Negative for myalgias.  Neurological: Negative for dizziness, sensory change, speech change, focal weakness, seizures and headaches.  Psychiatric/Behavioral: Negative for depression.    DRUG ALLERGIES:   Allergies  Allergen Reactions  . Demerol [Meperidine] Nausea And Vomiting  . Reglan [Metoclopramide] Swelling  . Penicillins Swelling and Rash    Swelling to arm at injection site. Has patient had a PCN reaction causing immediate rash, facial/tongue/throat swelling, SOB or lightheadedness with hypotension: Yes Has patient had a PCN reaction causing severe rash involving mucus membranes or skin necrosis: No Has patient had a PCN reaction that required hospitalization No Has patient had a PCN reaction occurring within the last 10 years: No If all of the above answers are "NO", then may proceed with Cephalosporin use.     VITALS:  Blood pressure (!) 148/65, pulse (!) 49, temperature 98 F (36.7 C), temperature source Oral, resp. rate 16, height 5'  7" (1.702 m), weight (!) 138.5 kg (305 lb 6.4 oz), SpO2 96 %.  PHYSICAL EXAMINATION:  Physical Exam  GENERAL:  80 y.o.-year-old patient lying in the bed with no acute distress.  EYES: Pupils equal, round, reactive to light and accommodation. No scleral icterus. Extraocular muscles intact.  HEENT: Head atraumatic, normocephalic. Oropharynx and nasopharynx clear. NG tube in place with continuous suction NECK:  Supple, no jugular venous distention. No thyroid enlargement, no tenderness.  LUNGS: Normal breath sounds bilaterally, no wheezing, rales,rhonchi or crepitation. No use of accessory muscles of respiration. Decreased bibasilar breath sounds CARDIOVASCULAR: S1, S2 normal. No murmurs, rubs, or gallops.  ABDOMEN: Soft, obese, distended, non tender. Bowel sounds present. No organomegaly or mass.  EXTREMITIES: No pedal edema, cyanosis, or clubbing.  NEUROLOGIC: Cranial nerves II through XII are intact. Muscle strength 5/5 in all extremities. Sensation intact. Gait not checked.  PSYCHIATRIC: The patient is alert and oriented x 3.  SKIN: No obvious rash, lesion, or ulcer.    LABORATORY PANEL:   CBC  Recent Labs Lab 02/17/16 0417  WBC 8.7  HGB 10.8*  HCT 32.5*  PLT 168   ------------------------------------------------------------------------------------------------------------------  Chemistries   Recent Labs Lab 02/14/2016 1830  02/14/16 0055  02/18/16 0334  NA 138  < > 140  < > 144  K 3.6  < > 4.3  < > 3.6  CL 104  < > 114*  < > 105  CO2 20*  < > 20*  < > 25  GLUCOSE 446*  < > 226*  < > 347*  BUN 20  < > 37*  < > 65*  CREATININE  1.13*  < > 1.40*  < > 1.63*  CALCIUM 9.3  < > 7.8*  < > 7.7*  MG  --   < > 2.0  --   --   AST 22  --   --   --   --   ALT 16  --   --   --   --   ALKPHOS 68  --   --   --   --   BILITOT 0.8  --   --   --   --   < > = values in this interval not  displayed. ------------------------------------------------------------------------------------------------------------------  Cardiac Enzymes  Recent Labs Lab 02/16/2016 1830  TROPONINI <0.03   ------------------------------------------------------------------------------------------------------------------  RADIOLOGY:  Dg Abd 1 View  Result Date: 02/18/2016 CLINICAL DATA:  Follow-up ileus, NG tube placement EXAM: ABDOMEN - 1 VIEW COMPARISON:  CT abdomen pelvis of 02/08/2016 and abdomen films of 02/17/2016 FINDINGS: Erect views of the abdomen show gaseous distention of small bowel with differential air-fluid levels. Although this could indicate ileus, a partial small bowel obstruction is a definite consideration. No free air is seen on these erect views of the abdomen. No opaque calculi are seen. IMPRESSION: Gaseous distention of small bowel loops with air-fluid levels suspicious for partial small bowel obstruction. No free air. Electronically Signed   By: Dwyane Dee M.D.   On: 02/18/2016 11:53   Dg Abd 1 View  Result Date: 02/17/2016 CLINICAL DATA:  80 year old female with enteric tube placement. EXAM: ABDOMEN - 1 VIEW COMPARISON:  Abdominal radiograph dated 02/16/2016 and CT dated 02/02/2016 FINDINGS: An enteric tube is noted with tip in stable positioning below the level of the diaphragm likely in the proximal stomach just distal to the gastroesophageal junction. Multiple dilated and air distended loops of small bowel again noted throughout the abdomen measuring up to 5.2 cm in the left lower quadrant. No definite free air identified. No radiopaque calculi. The soft tissues and the osseous structures are grossly unremarkable. IMPRESSION: Enteric tube in stable positioning in the proximal stomach. Air distended stomach with persistent dilatation of small-bowel loops. Follow-up recommended. Electronically Signed   By: Elgie Collard M.D.   On: 02/17/2016 03:26   Dg Abd 1 View  Result Date:  02/16/2016 CLINICAL DATA:  Encounter for nasogastric tube placement EXAM: ABDOMEN - 1 VIEW COMPARISON:  02/16/2016 FINDINGS: The nasogastric tube tip is below the GE junction. The side port appears to be above the GE junction. Gaseous distension of the stomach and visualized small bowel loops noted. IMPRESSION: 1. Tip of the nasogastric tube is just below the GE junction within the proximal stomach. Electronically Signed   By: Signa Kell M.D.   On: 02/16/2016 21:40   Dg Abd 1 View  Result Date: 02/16/2016 CLINICAL DATA:  NG tube placement. EXAM: ABDOMEN - 1 VIEW COMPARISON:  Plain film from earlier same day. FINDINGS: Nasogastric tube is coiled on itself with tip directed upwards. Tip is above the upper margin of this abdomen x-ray. Again noted is gaseous distention of the colon and upper abdominal small bowel, incompletely imaged. IMPRESSION: Nasogastric tube is coiled on itself within the esophagus. Tip is directed upwards and is located above the upper portion of this x-ray, at least above the level of the midesophagus. Recommend repositioning. These results were called by telephone at the time of interpretation on 02/16/2016 at 7:23 pm to the patient's nursewho verbally acknowledged these results. Electronically Signed   By: Anne Ng.D.  On: 02/16/2016 19:24   Dg Abd 1 View  Result Date: 02/16/2016 CLINICAL DATA:  80 y/o F; abdominal distention with diarrhea, abdominal pain, and emesis. EXAM: ABDOMEN - 1 VIEW COMPARISON:  02/15/2016 CT of the abdomen and pelvis. FINDINGS: There has been interval development of multiple dilated loops of small bowel which may represents ileus or downstream obstruction. Moderate gastric distention. Mild levocurvature and degenerative changes of lower lumbar spine. IMPRESSION: Interval development of multiple dilated loops of small bowel which may represent ileus or downstream obstruction. Moderate gastric distention. Electronically Signed   By: Mitzi HansenLance   Furusawa-Stratton M.D.   On: 02/16/2016 15:25   [place] Abdomen Complete  Result Date: 02/17/2016 CLINICAL DATA:  Abdominal distention for 3 days. EXAM: ABDOMEN ULTRASOUND COMPLETE COMPARISON:  None. FINDINGS: Gallbladder: No gallstones or wall thickening visualized. No sonographic Murphy sign noted by sonographer. Probable mild amount of sludge is noted within gallbladder lumen. Common bile duct: Diameter: 6.5 mm which is within normal limits. Liver: No focal lesion identified. Increased echogenicity is noted consistent with fatty infiltration. IVC: No abnormality visualized. Pancreas: Not visualized due to overlying bowel gas. Spleen: Size and appearance within normal limits. Right Kidney: Length: 11.1 cm. Echogenicity within normal limits. No mass or hydronephrosis visualized. Left Kidney: Length: 11.8 cm. Echogenicity within normal limits. No mass or hydronephrosis visualized. Abdominal aorta: No aneurysm visualized. Other findings: None. IMPRESSION: Pancreas not visualized due due to overlying bowel gas. Fatty infiltration of the liver is noted. Probable gallbladder sludge noted. Electronically Signed   By: Lupita RaiderJames  Green Jr, M.D.   On: 02/17/2016 10:12    EKG:   Orders placed or performed during the hospital encounter of 02/11/2016  . EKG 12-Lead  . EKG 12-Lead    ASSESSMENT AND PLAN:   BobbieMilburnis a 80 y.o.femalewho presents with Acute onset abdominal pain with coffee-ground emesis and hematochezia, also had hyperglycemia, afib rvr and mental status changes  #1 Acute GI bleed - likely upper and lower both- gastritis and diverticulitis probably - stopped now, hb stable. Appreciate GI consult - CT of the abd with hiatal hernia and diverticulosis- could have gastritis - continue protonix- change to oral bid, no active rectal bleeding noted now  #2 Ileus- with abdominal distention, NG tube placed- decreasing output today, KUB with still ileus - continue continuous suction - keep her  NPO  #3 .Atrial fibrillation with RVR (HCC) - restarted cardizem drip, since BP is improving- started oral metoprolol and change to oral cardizem by AM - likely not absorbing oral meds -no anticoagulation due to GI bleed (pt was on eliquis in the past-she stopped it several months ago)  #4 UncontrolledDiabetes (HCC)  Due to medication non compliance on admission -NPO currently, started lantus BID as sugars elevated today - a1c 11.9, on SSI  #5 ARF- ATN from sepsis,decreasd IV fluids rate - cr plateaued,   kidneys on the CT abd without any acute findings - avoid nephrotoxins, monitor  #6. DVT Prophylaxis- TEDs and SCDs SQ heparin   Patient is alert oriented now, discussed code status- wants to be full code Also family situation not very friendly, not sure if can safely return home- social worker consult Physical Therapy consult   All the records are reviewed and case discussed with Care Management/Social Workerr. Management plans discussed with the patient, family and they are in agreement.  CODE STATUS: Full Code  TOTAL TIME TAKING CARE OF THIS PATIENT: 36minutes.   POSSIBLE D/C IN 2 DAYS, DEPENDING ON CLINICAL CONDITION.  Enid Baas M.D on 02/18/2016 at 2:31 PM  Between 7am to 6pm - Pager - [phone]  After 6pm go to [email or web address] - Social research officer, government  [facility] [place] [facility]  Office  [phone]  CC: Primary care physician; No PCP Per Patient

## 2016-02-18 NOTE — Progress Notes (Signed)
This morning pt's heart rate 120-140. Metoprolol 5 mg IV given. VS rechecked 20 minutes later, HR 99-110.

## 2016-02-19 ENCOUNTER — Inpatient Hospital Stay: Payer: Commercial Managed Care - HMO

## 2016-02-19 DIAGNOSIS — I4891 Unspecified atrial fibrillation: Secondary | ICD-10-CM

## 2016-02-19 LAB — CULTURE, BLOOD (ROUTINE X 2)
Culture: NO GROWTH
Culture: NO GROWTH

## 2016-02-19 LAB — GLUCOSE, CAPILLARY
GLUCOSE-CAPILLARY: 222 mg/dL — AB (ref 65–99)
GLUCOSE-CAPILLARY: 188 mg/dL — AB (ref 65–99)
Glucose-Capillary: 177 mg/dL — ABNORMAL HIGH (ref 65–99)
Glucose-Capillary: 205 mg/dL — ABNORMAL HIGH (ref 65–99)

## 2016-02-19 LAB — BASIC METABOLIC PANEL
Anion gap: 6 (ref 5–15)
BUN: 51 mg/dL — AB (ref 6–20)
CO2: 33 mmol/L — ABNORMAL HIGH (ref 22–32)
CREATININE: 1.05 mg/dL — AB (ref 0.44–1.00)
Calcium: 7.7 mg/dL — ABNORMAL LOW (ref 8.9–10.3)
Chloride: 103 mmol/L (ref 101–111)
GFR, EST AFRICAN AMERICAN: 57 mL/min — AB (ref >60)
GFR, EST NON AFRICAN AMERICAN: 50 mL/min — AB (ref >60)
Glucose, Bld: 181 mg/dL — ABNORMAL HIGH (ref 65–99)
POTASSIUM: 3.1 mmol/L — AB (ref 3.5–5.1)
SODIUM: 142 mmol/L (ref 135–145)

## 2016-02-19 LAB — MAGNESIUM: MAGNESIUM: 2.1 mg/dL (ref 1.7–2.4)

## 2016-02-19 MED ORDER — INSULIN GLARGINE 100 UNIT/ML ~~LOC~~ SOLN
14.0000 [IU] | Freq: Two times a day (BID) | SUBCUTANEOUS | Status: DC
  Administered 2016-02-19 – 2016-02-21 (×4): 14 [IU] via SUBCUTANEOUS
  Filled 2016-02-19 (×6): qty 0.14

## 2016-02-19 MED ORDER — POTASSIUM CHLORIDE 10 MEQ/100ML IV SOLN
10.0000 meq | INTRAVENOUS | Status: AC
  Administered 2016-02-19 (×4): 10 meq via INTRAVENOUS
  Filled 2016-02-19 (×4): qty 100

## 2016-02-19 MED ORDER — DILTIAZEM HCL 60 MG PO TABS
60.0000 mg | ORAL_TABLET | Freq: Three times a day (TID) | ORAL | Status: DC
  Administered 2016-02-19 – 2016-02-20 (×3): 60 mg via ORAL
  Filled 2016-02-19 (×3): qty 1

## 2016-02-19 NOTE — Progress Notes (Signed)
Oxygen saturation 97% on 1 liter

## 2016-02-19 NOTE — Progress Notes (Signed)
[employer] - [place] at [facility]   PATIENT NAME: [name]    MR#:  [id number]  DATE OF BIRTH:  05/17/1936  SUBJECTIVE:  CHIEF COMPLAINT:   Chief Complaint  Patient presents with  . Diarrhea  . Abdominal Pain  . Emesis   - HR still elevated, NG drainage increased overnight, denies any nausea now. - feels hungry and wants to eat  REVIEW OF SYSTEMS:  Review of Systems  Constitutional: Positive for malaise/fatigue. Negative for chills and fever.  HENT: Negative for ear discharge, ear pain and tinnitus.   Eyes: Negative for blurred vision and double vision.  Respiratory: Positive for shortness of breath. Negative for cough and wheezing.   Cardiovascular: Negative for chest pain, palpitations and leg swelling.  Gastrointestinal: Negative for abdominal pain, constipation, diarrhea, nausea and vomiting.       Abdominal distention  Genitourinary: Negative for dysuria and urgency.  Musculoskeletal: Negative for myalgias.  Neurological: Negative for dizziness, sensory change, speech change, focal weakness, seizures and headaches.  Psychiatric/Behavioral: Negative for depression.    DRUG ALLERGIES:   Allergies  Allergen Reactions  . Demerol [Meperidine] Nausea And Vomiting  . Reglan [Metoclopramide] Swelling  . Penicillins Swelling and Rash    Swelling to arm at injection site. Has patient had a PCN reaction causing immediate rash, facial/tongue/throat swelling, SOB or lightheadedness with hypotension: Yes Has patient had a PCN reaction causing severe rash involving mucus membranes or skin necrosis: No Has patient had a PCN reaction that required hospitalization No Has patient had a PCN reaction occurring within the last 10 years: No If all of the above answers are "NO", then may proceed with Cephalosporin use.     VITALS:  Blood pressure 133/79, pulse (!) 121, temperature 97.7 F (36.5 C), temperature source Oral, resp. rate 20, height 5\' 7"  (1.702  m), weight (!) 138.5 kg (305 lb 6.4 oz), SpO2 94 %.  PHYSICAL EXAMINATION:  Physical Exam  GENERAL:  80 y.o.-year-old patient lying in the bed with no acute distress.  EYES: Pupils equal, round, reactive to light and accommodation. No scleral icterus. Extraocular muscles intact.  HEENT: Head atraumatic, normocephalic. Oropharynx and nasopharynx clear. NG tube in place with continuous suction NECK:  Supple, no jugular venous distention. No thyroid enlargement, no tenderness.  LUNGS: Normal breath sounds bilaterally, no wheezing, rales,rhonchi or crepitation. No use of accessory muscles of respiration. Decreased bibasilar breath sounds CARDIOVASCULAR: S1, S2 normal. No murmurs, rubs, or gallops.  ABDOMEN: Soft, obese, distended, non tender. Hypoactive Bowel sounds present. No organomegaly or mass.  EXTREMITIES: No pedal edema, cyanosis, or clubbing.  NEUROLOGIC: Cranial nerves II through XII are intact. Muscle strength 5/5 in all extremities. Sensation intact. Gait not checked.  PSYCHIATRIC: The patient is alert and oriented x 3.  SKIN: No obvious rash, lesion, or ulcer.    LABORATORY PANEL:   CBC  Recent Labs Lab 02/17/16 0417  WBC 8.7  HGB 10.8*  HCT 32.5*  PLT 168   ------------------------------------------------------------------------------------------------------------------  Chemistries   Recent Labs Lab 02/13/2016 1830  02/19/16 0429  NA 138  < > 142  K 3.6  < > 3.1*  CL 104  < > 103  CO2 20*  < > 33*  GLUCOSE 446*  < > 181*  BUN 20  < > 51*  CREATININE 1.13*  < > 1.05*  CALCIUM 9.3  < > 7.7*  MG  --   < > 2.1  AST 22  --   --  ALT 16  --   --   ALKPHOS 68  --   --   BILITOT 0.8  --   --   < > = values in this interval not displayed. ------------------------------------------------------------------------------------------------------------------  Cardiac Enzymes  Recent Labs Lab 02/07/2016 1830  TROPONINI <0.03    ------------------------------------------------------------------------------------------------------------------  RADIOLOGY:  Dg Abd 1 View  Result Date: 02/19/2016 CLINICAL DATA:  80 year old female with enteric tube placement. EXAM: ABDOMEN - 1 VIEW COMPARISON:  Abdominal radiograph dated 02/18/2016 FINDINGS: There has been interval advancement of the enteric tube with tip in the left upper abdomen over the gastric bubble, likely in the proximal stomach. Persistent there is distended loops of small bowel noted in the visualized upper abdomen measuring up to 3.5 cm diameter. IMPRESSION: Enteric tube with tip and side-port in the proximal stomach. Persistent mild air distention of small bowel in the upper abdomen. Electronically Signed   By: Elgie CollardArash  Radparvar M.D.   On: 02/19/2016 02:34   Dg Abd 1 View  Result Date: 02/18/2016 CLINICAL DATA:  Follow-up ileus, NG tube placement EXAM: ABDOMEN - 1 VIEW COMPARISON:  CT abdomen pelvis of 02/07/2016 and abdomen films of 02/17/2016 FINDINGS: Erect views of the abdomen show gaseous distention of small bowel with differential air-fluid levels. Although this could indicate ileus, a partial small bowel obstruction is a definite consideration. No free air is seen on these erect views of the abdomen. No opaque calculi are seen. IMPRESSION: Gaseous distention of small bowel loops with air-fluid levels suspicious for partial small bowel obstruction. No free air. Electronically Signed   By: Dwyane DeePaul  Barry M.D.   On: 02/18/2016 11:53    EKG:   Orders placed or performed during the hospital encounter of 02/20/2016  . EKG 12-Lead  . EKG 12-Lead    ASSESSMENT AND PLAN:   BobbieMilburnis a 80 y.o.femalewho presents with Acute onset abdominal pain with coffee-ground emesis and hematochezia, also had hyperglycemia, afib rvr and mental status changes  #1 Acute GI bleed - likely upper and lower both- gastritis and diverticulitis probably - stopped now, hb  stable. Appreciate GI consult - CT of the abd with hiatal hernia and diverticulosis- could have gastritis - continue protonix- change to oral bid, no active rectal bleeding noted now  #2 Ileus- with abdominal distention, NG tube placed-increased output today, f/u KUB tomorrow - continue continuous suction - keep her NPO  #3 .Atrial fibrillation with RVR (HCC) - started oral cardizem and oral metoprolol - off cardizem drip, HR ok up to 120, appreciate EP cardiology consult -no anticoagulation due to GI bleed (pt was on eliquis in the past-she stopped it several months ago)  #4 UncontrolledDiabetes (HCC)  Due to medication non compliance on admission -NPO currently, started lantus BID as sugars elevated - a1c 11.9, on SSI  #5 ARF- ATN from sepsis, cr stable, fluids stopped Monitor, if NG output worsens- might need to restart IV fluids   kidneys on the CT abd without any acute findings - avoid nephrotoxins, monitor  #6 Hypokalemia- replaced. Magnesium level normal  #6. DVT Prophylaxis- TEDs and SCDs SQ heparin   Physical Therapy consulted. Might need SNF at discharge Discharge plans once ileus resolves.   All the records are reviewed and case discussed with Care Management/Social Workerr. Management plans discussed with the patient, family and they are in agreement.  CODE STATUS: Full Code  TOTAL TIME TAKING CARE OF THIS PATIENT: 36minutes.   POSSIBLE D/C IN 3 DAYS, DEPENDING ON CLINICAL CONDITION.   Enid BaasKALISETTI,Loras Grieshop  M.D on 02/19/2016 at 1:44 PM  Between 7am to 6pm - Pager - [phone]  After 6pm go to [email or web address] - Social research officer, government  [facility] [place] [facility]  Office  [phone]  CC: Primary care physician; No PCP Per Patient

## 2016-02-19 NOTE — Progress Notes (Signed)
Cardizem drip discontinued and cardizem PO ordered.

## 2016-02-19 NOTE — Consult Note (Signed)
ELECTROPHYSIOLOGY CONSULT NOTE  Patient ID: [name], MRN: [id number], DOB/AGE: 1935-11-13 80 y.o. Admit date: 12-Mar-2016 Date of Consult: 02/19/2016  Primary Physician: No PCP Per Patient Primary Cardiologist:  Chief Complaint: afib    HPI [name] is a 80 y.o. female  Admitted 9/21 with abdominal pain and diarrhea and emesis and GI bleeding. She had had problems with fevers although none since hospitalization. There was concern about   "sepsis" of undetermined origin as the cause of her hypotension; she was treated with antibiotics  Her course has been complicated by atrial fibrillation with rapid rates and initially low blood pressures making rate control medications difficult and poor by mouth intake required intravenous medications.  Outpatient anticoagulation had been held because of GI bleeding.  Echocardiogram 9/23 demonstrated normal LV function with mildly dilated left atrium. Last TSH 2/15  Past Medical History:  Diagnosis Date  . Atrial fibrillation (HCC)   . Diabetes mellitus without complication (HCC)   . Hypertension       Surgical History:  Past Surgical History:  Procedure Laterality Date  . ABDOMINAL HYSTERECTOMY       Home Meds: Prior to Admission medications   Not on File    Inpatient Medications:  . heparin subcutaneous  5,000 Units Subcutaneous Q8H  . insulin aspart  0-9 Units Subcutaneous Q6H  . insulin glargine  12 Units Subcutaneous BID  . metoprolol tartrate  25 mg Oral Q6H  . pantoprazole  40 mg Oral BID  . predniSONE  40 mg Oral Q breakfast  . senna-docusate  1 tablet Oral BID  . sodium chloride flush  3 mL Intravenous Q12H     Allergies:  Allergies  Allergen Reactions  . Demerol [Meperidine] Nausea And Vomiting  . Reglan [Metoclopramide] Swelling  . Penicillins Swelling and Rash    Swelling to arm at injection site. Has patient had a PCN reaction causing immediate rash, facial/tongue/throat swelling, SOB or  lightheadedness with hypotension: Yes Has patient had a PCN reaction causing severe rash involving mucus membranes or skin necrosis: No Has patient had a PCN reaction that required hospitalization No Has patient had a PCN reaction occurring within the last 10 years: No If all of the above answers are "NO", then may proceed with Cephalosporin use.     Social History   Social History  . Marital status: Divorced    Spouse name: N/A  . Number of children: N/A  . Years of education: N/A   Occupational History  . Not on file.   Social History Main Topics  . Smoking status: Never Smoker  . Smokeless tobacco: Never Used  . Alcohol use No  . Drug use: No  . Sexual activity: Not on file   Other Topics Concern  . Not on file   Social History Narrative  . No narrative on file     Family History  Problem Relation Age of Onset  . Diabetes Mother   . Kidney failure Mother   . Diabetes Father   . Kidney failure Father   . Stroke Father      ROS:  Please see the history of present illness.      All other systems reviewed and negative.    Physical Exam:  Blood pressure 136/63, pulse 98, temperature 98.2 F (36.8 C), temperature source Oral, resp. rate 16, height 5\' 7"  (1.702 m), weight (!) 305 lb 6.4 oz (138.5 kg), SpO2 100 %. General: Well developed, well nourished female in no  acute distress. NG tube in place Head: Normocephalic, atraumatic, sclera non-icteric, no xanthomas, nares are without discharge. EENT: normal Lymph Nodes:  none Back: without scoliosis/kyphosis , no CVA tendersness Neck: Negative for carotid bruits. JVD not elevated. Lungs: Clear bilaterally to auscultation without wheezes, rales, or rhonchi. Breathing is unlabored. Heart: Rapid and irregularly irregular rate and rhythm without murmur , rubs, or gallops appreciated. Abdomen: Soft, non-tender, non-distended  No hepatomegaly. No rebound/guarding. No obvious abdominal masses. Msk:  Strength and tone appear  normal for age. Extremities: No clubbing or cyanosis. No edema.  Distal pedal pulses are 2+ and equal bilaterally. Skin: Warm and Dry Neuro: Alert and oriented X 3. CN III-XII intact Grossly normal sensory and motor function . Psych:  Responds to questions appropriately with a normal affect.      Labs: Cardiac Enzymes No results for input(s): CKTOTAL, CKMB, TROPONINI in the last 72 hours. CBC Lab Results  Component Value Date   WBC 8.7 02/17/2016   HGB 10.8 (L) 02/17/2016   HCT 32.5 (L) 02/17/2016   MCV 92.8 02/17/2016   PLT 168 02/17/2016   PROTIME: No results for input(s): LABPROT, INR in the last 72 hours. Chemistry  Recent Labs Lab 02/09/2016 1830  02/19/16 0429  NA 138  < > 142  K 3.6  < > 3.1*  CL 104  < > 103  CO2 20*  < > 33*  BUN 20  < > 51*  CREATININE 1.13*  < > 1.05*  CALCIUM 9.3  < > 7.7*  PROT 8.2*  --   --   BILITOT 0.8  --   --   ALKPHOS 68  --   --   ALT 16  --   --   AST 22  --   --   GLUCOSE 446*  < > 181*  < > = values in this interval not displayed. Lipids Lab Results  Component Value Date   CHOL 96 07/05/2013   HDL 21 (L) 07/05/2013   LDLCALC 42 07/05/2013   TRIG 165 07/05/2013   BNP No results found for: PROBNP Thyroid Function Tests: No results for input(s): TSH, T4TOTAL, T3FREE, THYROIDAB in the last 72 hours.  Invalid input(s): FREET3    Miscellaneous No results found for: DDIMER  Radiology/Studies:  Dg Chest 1 View  Result Date: 02/14/2016 CLINICAL DATA:  Fever EXAM: CHEST 1 VIEW COMPARISON:  02/11/2016 chest radiograph. FINDINGS: Stable cardiomediastinal silhouette with cardiomegaly and aortic atherosclerosis. No pneumothorax. No pleural effusion. No pulmonary edema. No acute consolidative airspace disease. IMPRESSION: Stable mild cardiomegaly without pulmonary edema. No acute pulmonary disease. Aortic atherosclerosis. Electronically Signed   By: Delbert PhenixJason A Poff M.D.   On: 02/14/2016 21:15   Dg Abd 1 View  Result Date:  02/19/2016 CLINICAL DATA:  80 year old female with enteric tube placement. EXAM: ABDOMEN - 1 VIEW COMPARISON:  Abdominal radiograph dated 02/18/2016 FINDINGS: There has been interval advancement of the enteric tube with tip in the left upper abdomen over the gastric bubble, likely in the proximal stomach. Persistent there is distended loops of small bowel noted in the visualized upper abdomen measuring up to 3.5 cm diameter. IMPRESSION: Enteric tube with tip and side-port in the proximal stomach. Persistent mild air distention of small bowel in the upper abdomen. Electronically Signed   By: Elgie CollardArash  Radparvar M.D.   On: 02/19/2016 02:34   Dg Abd 1 View  Result Date: 02/18/2016 CLINICAL DATA:  Follow-up ileus, NG tube placement EXAM: ABDOMEN - 1 VIEW COMPARISON:  CT abdomen pelvis of 02/20/2016 and abdomen films of 02/17/2016 FINDINGS: Erect views of the abdomen show gaseous distention of small bowel with differential air-fluid levels. Although this could indicate ileus, a partial small bowel obstruction is a definite consideration. No free air is seen on these erect views of the abdomen. No opaque calculi are seen. IMPRESSION: Gaseous distention of small bowel loops with air-fluid levels suspicious for partial small bowel obstruction. No free air. Electronically Signed   By: Dwyane Dee M.D.   On: 02/18/2016 11:53   Dg Abd 1 View  Result Date: 02/17/2016 CLINICAL DATA:  80 year old female with enteric tube placement. EXAM: ABDOMEN - 1 VIEW COMPARISON:  Abdominal radiograph dated 02/16/2016 and CT dated 02/01/2016 FINDINGS: An enteric tube is noted with tip in stable positioning below the level of the diaphragm likely in the proximal stomach just distal to the gastroesophageal junction. Multiple dilated and air distended loops of small bowel again noted throughout the abdomen measuring up to 5.2 cm in the left lower quadrant. No definite free air identified. No radiopaque calculi. The soft tissues and the  osseous structures are grossly unremarkable. IMPRESSION: Enteric tube in stable positioning in the proximal stomach. Air distended stomach with persistent dilatation of small-bowel loops. Follow-up recommended. Electronically Signed   By: Elgie Collard M.D.   On: 02/17/2016 03:26   Dg Abd 1 View  Result Date: 02/16/2016 CLINICAL DATA:  Encounter for nasogastric tube placement EXAM: ABDOMEN - 1 VIEW COMPARISON:  02/16/2016 FINDINGS: The nasogastric tube tip is below the GE junction. The side port appears to be above the GE junction. Gaseous distension of the stomach and visualized small bowel loops noted. IMPRESSION: 1. Tip of the nasogastric tube is just below the GE junction within the proximal stomach. Electronically Signed   By: Signa Kell M.D.   On: 02/16/2016 21:40   Dg Abd 1 View  Result Date: 02/16/2016 CLINICAL DATA:  NG tube placement. EXAM: ABDOMEN - 1 VIEW COMPARISON:  Plain film from earlier same day. FINDINGS: Nasogastric tube is coiled on itself with tip directed upwards. Tip is above the upper margin of this abdomen x-ray. Again noted is gaseous distention of the colon and upper abdominal small bowel, incompletely imaged. IMPRESSION: Nasogastric tube is coiled on itself within the esophagus. Tip is directed upwards and is located above the upper portion of this x-ray, at least above the level of the midesophagus. Recommend repositioning. These results were called by telephone at the time of interpretation on 02/16/2016 at 7:23 pm to the patient's nursewho verbally acknowledged these results. Electronically Signed   By: Bary Richard M.D.   On: 02/16/2016 19:24   Dg Abd 1 View  Result Date: 02/16/2016 CLINICAL DATA:  80 y/o F; abdominal distention with diarrhea, abdominal pain, and emesis. EXAM: ABDOMEN - 1 VIEW COMPARISON:  02/07/2016 CT of the abdomen and pelvis. FINDINGS: There has been interval development of multiple dilated loops of small bowel which may represents ileus or  downstream obstruction. Moderate gastric distention. Mild levocurvature and degenerative changes of lower lumbar spine. IMPRESSION: Interval development of multiple dilated loops of small bowel which may represent ileus or downstream obstruction. Moderate gastric distention. Electronically Signed   By: Mitzi Hansen M.D.   On: 02/16/2016 15:25   Ct Head Wo Contrast  Result Date: 02/14/2016 CLINICAL DATA:  Altered mental status.  Inpatient. EXAM: CT HEAD WITHOUT CONTRAST TECHNIQUE: Contiguous axial images were obtained from the base of the skull through the vertex without intravenous  contrast. COMPARISON:  None. FINDINGS: Brain: No evidence of parenchymal hemorrhage or extra-axial fluid collection. No mass lesion, mass effect, or midline shift. No CT evidence of acute infarction. Intracranial atherosclerosis. Generalized cerebral volume loss. Nonspecific moderate subcortical and periventricular white matter hypodensity, most in keeping with chronic small vessel ischemic change. No ventriculomegaly. Vascular: No hyperdense vessel or unexpected calcification. Skull: No evidence of calvarial fracture. Sinuses/Orbits: The visualized paranasal sinuses are essentially clear. Other: Patient is edentulous. The mastoid air cells are unopacified. IMPRESSION: 1.  No evidence of acute intracranial abnormality. 2. Generalized cerebral volume loss and moderate chronic small vessel ischemia. Electronically Signed   By: Delbert Phenix M.D.   On: 02/14/2016 17:38   US Abdomen Complete  Result Date: 02/17/2016 CLINICAL DATA:  Abdominal distention for 3 days. EXAM: ABDOMEN ULTRASOUND COMPLETE COMPARISON:  None. FINDINGS: Gallbladder: No gallstones or wall thickening visualized. No sonographic Murphy sign noted by sonographer. Probable mild amount of sludge is noted within gallbladder lumen. Common bile duct: Diameter: 6.5 mm which is within normal limits. Liver: No focal lesion identified. Increased echogenicity is  noted consistent with fatty infiltration. IVC: No abnormality visualized. Pancreas: Not visualized due to overlying bowel gas. Spleen: Size and appearance within normal limits. Right Kidney: Length: 11.1 cm. Echogenicity within normal limits. No mass or hydronephrosis visualized. Left Kidney: Length: 11.8 cm. Echogenicity within normal limits. No mass or hydronephrosis visualized. Abdominal aorta: No aneurysm visualized. Other findings: None. IMPRESSION: Pancreas not visualized due due to overlying bowel gas. Fatty infiltration of the liver is noted. Probable gallbladder sludge noted. Electronically Signed   By: Lupita Raider, M.D.   On: 02/17/2016 10:12   Ct Abdomen Pelvis W Contrast  Result Date: 01/29/2016 CLINICAL DATA:  Acute onset of epigastric abdominal pain and nausea. Vomiting and diarrhea. Initial encounter. EXAM: CT ABDOMEN AND PELVIS WITH CONTRAST TECHNIQUE: Multidetector CT imaging of the abdomen and pelvis was performed using the standard protocol following bolus administration of intravenous contrast. CONTRAST:  75mL ISOVUE-300 IOPAMIDOL (ISOVUE-300) INJECTION 61% COMPARISON:  PET/CT performed 07/09/2013 FINDINGS: Lower chest: Mild bibasilar atelectasis is noted. The previously noted right lower lobe masslike density has resolved. A small to moderate hiatal hernia is seen, with fluid filling the distal esophagus. Hepatobiliary: The liver is unremarkable in appearance. The gallbladder is unremarkable in appearance. The common bile duct remains normal in caliber. Pancreas: The pancreas is within normal limits. Spleen: The spleen is unremarkable in appearance. A likely small calcified aneurysm is noted at the splenic hilum. Adrenals/Urinary Tract: The adrenal glands are unremarkable in appearance. Mild bilateral renal atrophy and scarring are noted, with underlying perinephric stranding. There is no evidence of hydronephrosis. No renal or ureteral stones are identified. Stomach/Bowel: The stomach  is otherwise unremarkable in appearance. The small bowel is within normal limits. The appendix is normal in caliber, without evidence of appendicitis. Mild diverticulosis is noted along the descending and sigmoid colon, without evidence of diverticulitis. Vascular/Lymphatic: Scattered calcification is seen along the abdominal aorta and its branches. The abdominal aorta is otherwise grossly unremarkable. The inferior vena cava is grossly unremarkable. No retroperitoneal lymphadenopathy is seen. No pelvic sidewall lymphadenopathy is identified. Reproductive: The bladder is significantly distended and grossly unremarkable. The patient is status post hysterectomy. No suspicious adnexal masses are seen. Other: No additional soft tissue abnormalities are seen. Musculoskeletal: No acute osseous abnormalities are identified. Endplate sclerotic change is noted at L4-L5, with associated vacuum phenomenon along the lower lumbar spine. There is chronic loss of height at  vertebral body T12. The visualized musculature is unremarkable in appearance. IMPRESSION: 1. No acute abnormality seen to explain the patient's symptoms. 2. Small to moderate hiatal hernia noted. Fluid fills the distal esophagus, likely reflecting mild esophageal dysmotility or gastroesophageal reflux. 3. Mild bibasilar atelectasis noted. 4. Aortic atherosclerosis. 5. Mild bilateral renal atrophy and scarring noted. 6. Mild diverticulosis along the descending and sigmoid colon, without evidence of diverticulitis. 7. Chronic loss of height at vertebral body T12, and mild degenerative change at the lower lumbar spine. Electronically Signed   By: Roanna Raider M.D.   On: 02/11/2016 21:12   Dg Chest Port 1 View  Result Date: 02/16/2016 CLINICAL DATA:  80 year old female with cough. EXAM: PORTABLE CHEST 1 VIEW COMPARISON:  Chest radiograph dated 02/14/2016 and CT dated 07/06/2013 FINDINGS: Single portable view of the chest demonstrates clear lungs. There is no  pleural effusion or pneumothorax. Stable cardiomegaly. There is degenerative changes of the spine and shoulders. No acute fracture. IMPRESSION: No acute cardiopulmonary process. Stable mild cardiomegaly. Electronically Signed   By: Elgie Collard M.D.   On: 02/16/2016 05:30   Dg Chest Portable 1 View  Result Date: 02/07/2016 CLINICAL DATA:  Initial evaluation for worsening shortness of breath. EXAM: PORTABLE CHEST 1 VIEW COMPARISON:  Prior radiograph from earlier the same day. FINDINGS: Stable cardiomegaly.  Mediastinal silhouette within normal limits. Lungs normally inflated. Overall pulmonary vascularity is not significantly changed without significant worsening edema. No pleural effusion. No focal infiltrates. No pneumothorax. Osseous structures unchanged. IMPRESSION: Stable cardiomegaly without radiographic evidence for significant edema. No other acute cardiopulmonary abnormality identified. Electronically Signed   By: Rise Mu M.D.   On: 01/30/2016 23:33   Dg Chest Port 1 View  Result Date: 02/13/2016 CLINICAL DATA:  80 y/o F; nausea vomiting and diarrhea with pain starting at approximately 12 p.m. today. EXAM: PORTABLE CHEST 1 VIEW COMPARISON:  07/12/2013 chest radiograph. FINDINGS: Stable enlarged cardiac silhouette given differences in technique. Aortic atherosclerosis with calcifications of the arch. No focal consolidation, pneumothorax, or pleural effusion. IMPRESSION: No active disease.  Stable cardiomegaly and aortic atherosclerosis. Electronically Signed   By: Mitzi Hansen M.D.   On: 02/18/2016 19:26    EKG:  Atrial fibrillation   Assessment and Plan: Atrial fibrillation  Possible sepsis syndrome  GI bleeding   The patient has persistent atrial fibrillation. Heart rates are in the 100-110 range. I think as her infection process settles down, whatever was that has responded to the antibiotics it was related to the hypotension, her heart rates will be  easier to control. Based on the Race 2 data  this is not an unreasonable heart rate range anyway.  I don't think there is need for augmented rate control at this juncture. Following resolution of her acute illness, re-anticoagulation and cardioversion would be appropriate. We will be glad to assist in this.    Sherryl Manges

## 2016-02-19 NOTE — Progress Notes (Signed)
Oxygen titrated down to 1 liter and will reassess patient in an hour

## 2016-02-19 NOTE — Consult Note (Signed)
   [facility]   02/19/2016  [name] CarrelBobbie L Sayre 05/26/1935 [id number]   Patient screened for potential [facility] Care Management services. Patient is eligible for [facility]. [facility] with the inpatient case manager about approaching patient about [facility] Care [facility] services and inpatient case manager relayed the patient would potentially be a good candidate for services. She requested liaison wait to speak with patient until more discharge planning was able to be discussed with patient related to patient has not been able to converse much with NG tube still in place. Inpatient case manager made a note of patient's eligibility and stated she would place a referral as the plan became more clear. Made inpatient case manager aware this liaison was going on vacation, but there was a liaison covering referrals. For questions please contact:   Cortlynn Hollinsworth RN, BSN [facility]  Hospital Liaison  [phone]) Business Mobile [phone]) Toll free office

## 2016-02-19 NOTE — Care Management (Addendum)
Increase in output from nasogastric tube.  Remains npo.  Physical therapy is recommending skilled nursing.  Remains on cardizem drip.  Patient verbalizes understanding of physical therapy recommendations and she maintains that she wishes to go home with home health and be with her family.

## 2016-02-19 NOTE — Progress Notes (Signed)
Increased GI output via NGT,off cardizem drip,remains NPO,oxygen down to 1 liter

## 2016-02-20 ENCOUNTER — Inpatient Hospital Stay: Payer: Commercial Managed Care - HMO

## 2016-02-20 LAB — BASIC METABOLIC PANEL
ANION GAP: 10 (ref 5–15)
BUN: 40 mg/dL — ABNORMAL HIGH (ref 6–20)
CO2: 29 mmol/L (ref 22–32)
Calcium: 7.8 mg/dL — ABNORMAL LOW (ref 8.9–10.3)
Chloride: 105 mmol/L (ref 101–111)
Creatinine, Ser: 0.95 mg/dL (ref 0.44–1.00)
GFR calc Af Amer: >60 mL/min (ref >60)
GFR, EST NON AFRICAN AMERICAN: 56 mL/min — AB (ref >60)
GLUCOSE: 189 mg/dL — AB (ref 65–99)
POTASSIUM: 3.5 mmol/L (ref 3.5–5.1)
SODIUM: 144 mmol/L (ref 135–145)

## 2016-02-20 LAB — GLUCOSE, CAPILLARY
GLUCOSE-CAPILLARY: 189 mg/dL — AB (ref 65–99)
GLUCOSE-CAPILLARY: 197 mg/dL — AB (ref 65–99)
Glucose-Capillary: 230 mg/dL — ABNORMAL HIGH (ref 65–99)
Glucose-Capillary: 187 mg/dL — ABNORMAL HIGH (ref 65–99)
Glucose-Capillary: 192 mg/dL — ABNORMAL HIGH (ref 65–99)

## 2016-02-20 MED ORDER — AMIODARONE HCL IN DEXTROSE 360-4.14 MG/200ML-% IV SOLN
30.0000 mg/h | INTRAVENOUS | Status: DC
  Administered 2016-02-21 – 2016-02-29 (×18): 30 mg/h via INTRAVENOUS
  Filled 2016-02-20 (×18): qty 200

## 2016-02-20 MED ORDER — METOPROLOL TARTRATE 5 MG/5ML IV SOLN
5.0000 mg | Freq: Four times a day (QID) | INTRAVENOUS | Status: DC
  Administered 2016-02-20 (×2): 5 mg via INTRAVENOUS
  Filled 2016-02-20 (×2): qty 5

## 2016-02-20 MED ORDER — DILTIAZEM LOAD VIA INFUSION
20.0000 mg | Freq: Once | INTRAVENOUS | Status: AC
  Administered 2016-02-20: 20 mg via INTRAVENOUS
  Filled 2016-02-20: qty 20

## 2016-02-20 MED ORDER — AMIODARONE LOAD VIA INFUSION
150.0000 mg | Freq: Once | INTRAVENOUS | Status: AC
  Administered 2016-02-20: 150 mg via INTRAVENOUS
  Filled 2016-02-20: qty 83.34

## 2016-02-20 MED ORDER — DILTIAZEM HCL 100 MG IV SOLR
5.0000 mg/h | INTRAVENOUS | Status: DC
  Administered 2016-02-20: 5 mg/h via INTRAVENOUS
  Administered 2016-02-20: 10 mg/h via INTRAVENOUS
  Filled 2016-02-20: qty 100

## 2016-02-20 MED ORDER — BISACODYL 10 MG RE SUPP
10.0000 mg | Freq: Every day | RECTAL | Status: DC
  Administered 2016-02-20 – 2016-02-28 (×8): 10 mg via RECTAL
  Filled 2016-02-20 (×9): qty 1

## 2016-02-20 MED ORDER — DILTIAZEM HCL 25 MG/5ML IV SOLN
10.0000 mg | INTRAVENOUS | Status: DC | PRN
  Administered 2016-02-20 – 2016-02-21 (×2): 10 mg via INTRAVENOUS
  Filled 2016-02-20 (×2): qty 5

## 2016-02-20 MED ORDER — AMIODARONE HCL IN DEXTROSE 360-4.14 MG/200ML-% IV SOLN
60.0000 mg/h | INTRAVENOUS | Status: AC
  Administered 2016-02-20 (×2): 60 mg/h via INTRAVENOUS
  Filled 2016-02-20: qty 200

## 2016-02-20 MED ORDER — POTASSIUM CHLORIDE IN NACL 40-0.9 MEQ/L-% IV SOLN
INTRAVENOUS | Status: DC
  Administered 2016-02-20: 75 mL/h via INTRAVENOUS
  Filled 2016-02-20: qty 1000

## 2016-02-20 NOTE — Progress Notes (Signed)
Abdominal xray results back, text page to Dr. Winona LegatoVaickute to review results.

## 2016-02-20 NOTE — Progress Notes (Signed)
S/p 20mg  IV cardizem administration, pt HR is better controlled: afib 120s. Per Dr. Winona LegatoVaickute, continue with cardizem gtt initiation at 5mg /hr. VSS, will continue to monitor.

## 2016-02-20 NOTE — Progress Notes (Signed)
Per Dr. Winona LegatoVaickute, advance NG tube 3" and increase to medium intermittent suction if needed.

## 2016-02-20 NOTE — Progress Notes (Signed)
Patient HR remains 130s afib. Dr. Winona LegatoVaickute made aware, and gave this RN orders for amio bolus and initiation of amio gtt, discontinue caridzem gtt. Will continue to monitor.

## 2016-02-20 NOTE — Progress Notes (Signed)
Spoke with Dr. Juliann Paresallwood regarding patient, asked physician to see [name] regarding continued elevated heart rate.

## 2016-02-20 NOTE — Care Management Important Message (Signed)
Important Message  Patient Details  Name: Dyanne CarrelBobbie L Davids MRN: [id number] Date of Birth: 01/05/1936   Medicare Important Message Given:  Yes    Eber HongGreene, Makiya Jeune R, RN 02/20/2016, 11:46 AM

## 2016-02-20 NOTE — Progress Notes (Signed)
Subjective:  Patient states she feels about the same. She doesn?t notice any worsening palpitatios. Denies F/C/S No CP.  Objective:  Vital Signs in the last 24 hours: Temp:  [97.3 F (36.3 C)-97.9 F (36.6 C)] 97.9 F (36.6 C) (09/29 1112) Pulse Rate:  [102-145] 102 (09/29 1202) Resp:  [15-18] 18 (09/29 0500) BP: (104-140)/(58-96) 115/96 (09/29 1202) SpO2:  [92 %-97 %] 92 % (09/29 1112)  Intake/Output from previous day: 09/28 0701 - 09/29 0700 In: 495 [P.O.:60; I.V.:35; IV Piggyback:400] Out: 3325 [Urine:825; Emesis/NG output:2500] Intake/Output from this shift: No intake/output data recorded.  Physical Exam: General appearance: appears older than stated age, fatigued, no distress and moderately obese Neck: no adenopathy, no carotid bruit, no JVD, supple, symmetrical, trachea midline and thyroid not enlarged, symmetric, no tenderness/mass/nodules Lungs: clear to auscultation bilaterally Heart: irregularly irregular rhythm Abdomen: soft, non-tender; bowel sounds normal; no masses,  no organomegaly Extremities: extremities normal, atraumatic, no cyanosis or edema Pulses: 2+ and symmetric Skin: Skin color, texture, turgor normal. No rashes or lesions Neurologic: Alert and oriented X 3, normal strength and tone. Normal symmetric reflexes. Normal coordination and gait  Lab Results: No results for input(s): WBC, HGB, PLT in the last 72 hours.  Recent Labs  02/19/16 0429 02/20/16 0444  NA 142 144  K 3.1* 3.5  CL 103 105  CO2 33* 29  GLUCOSE 181* 189*  BUN 51* 40*  CREATININE 1.05* 0.95   No results for input(s): TROPONINI in the last 72 hours.  Invalid input(s): CK, MB Hepatic Function Panel No results for input(s): PROT, ALBUMIN, AST, ALT, ALKPHOS, BILITOT, BILIDIR, IBILI in the last 72 hours. No results for input(s): CHOL in the last 72 hours. No results for input(s): PROTIME in the last 72 hours.  Imaging: Imaging results have been reviewed  Cardiac  Studies:  Assessment/Plan:  Arrhythmia Atrial Fibrillation Edema Palpitations Shortness of Breath hypertension  Diabetes Nausea vomiting diarrhea History of GI bleed . Plan Continue telemetry Increase Cardizem IV better rate control If not controlled then switched to amiodarone IV load and drip Continue metoprolol increased frequency for rates above 100 Agree with PICC line for IV access Corrected hypokalemia DVT prophylaxis We'll consider digoxin IV if issues with blood pressure  LOS: 8 days    CALLWOOD,DWAYNE D. 02/20/2016, 1:19 PM

## 2016-02-20 NOTE — Progress Notes (Signed)
PT Cancellation Note  Patient Details Name: [name] CarrelBobbie L Shingledecker MRN: [id number] DOB: 02/26/1936   Cancelled Treatment:    Reason Eval/Treat Not Completed: Other (comment) PT is placing pt on hold due to recent uncontrolled HR with placement on cardizem drip (02/20/16). Also noted that pt was found to have a small bowel obstruction and will follow up based on the surgical recommendations.    Albertina SenegalMeagan Royetta Probus, SPT 02/20/2016, 1:27 PM

## 2016-02-20 NOTE — Progress Notes (Addendum)
Patient HR continues to be Afib 130s. PRN cardizem given at 0740 this AM with little change in HR. Abdomen continues to be distended, no bowel sounds auscultated; patient reports no flatus and no BM since 9/23. Dr. Winona LegatoVaickute paged and made aware of these findings. Abd xray results pending from this AM. Orders to give 5mg  IV metoprolol and enema stat. Patient is asymptomatic at this time with no complaints, will continue to monitor.

## 2016-02-20 NOTE — Progress Notes (Signed)
[facility] [place] [facility]   PATIENT NAME: [name]    MR#:  [id number]  DATE OF BIRTH:  09/08/1935  SUBJECTIVE:  CHIEF COMPLAINT:   Chief Complaint  Patient presents with  . Diarrhea  . Abdominal Pain  . Emesis   - HR still elevated, to 130s, improved with metoprolol intravenously , now on Cardizem IV drip,  NGT  drainage about 2 L over the past 24 hours. Denies nausea and feels somewhat hungry. No obvious abdominal pain, however, no bowel movements, even after enemas today.    REVIEW OF SYSTEMS:  Review of Systems  Constitutional: Positive for malaise/fatigue. Negative for chills and fever.  HENT: Negative for ear discharge, ear pain and tinnitus.   Eyes: Negative for blurred vision and double vision.  Respiratory: Positive for shortness of breath. Negative for cough and wheezing.   Cardiovascular: Negative for chest pain, palpitations and leg swelling.  Gastrointestinal: Negative for abdominal pain, constipation, diarrhea, nausea and vomiting.       Abdominal distention  Genitourinary: Negative for dysuria and urgency.  Musculoskeletal: Negative for myalgias.  Neurological: Negative for dizziness, sensory change, speech change, focal weakness, seizures and headaches.  Psychiatric/Behavioral: Negative for depression.    DRUG ALLERGIES:   Allergies  Allergen Reactions  . Demerol [Meperidine] Nausea And Vomiting  . Reglan [Metoclopramide] Swelling  . Penicillins Swelling and Rash    Swelling to arm at injection site. Has patient had a PCN reaction causing immediate rash, facial/tongue/throat swelling, SOB or lightheadedness with hypotension: Yes Has patient had a PCN reaction causing severe rash involving mucus membranes or skin necrosis: No Has patient had a PCN reaction that required hospitalization No Has patient had a PCN reaction occurring within the last 10 years: No If all of the above answers are "NO", then may proceed with  Cephalosporin use.     VITALS:  Blood pressure 130/76, pulse (!) 132, temperature 97.5 F (36.4 C), temperature source Oral, resp. rate 20, height 5\' 7"  (1.702 m), weight (!) 138.5 kg (305 lb 6.4 oz), SpO2 93 %.  PHYSICAL EXAMINATION:  Physical Exam  GENERAL:  80 y.o.-year-old patient lying in the bed with no acute distress.  EYES: Pupils equal, round, reactive to light and accommodation. No scleral icterus. Extraocular muscles intact.  HEENT: Head atraumatic, normocephalic. Oropharynx and nasopharynx clear. NG tube in place with continuous suction NECK:  Supple, no jugular venous distention. No thyroid enlargement, no tenderness.  LUNGS: Normal breath sounds bilaterally, no wheezing, rales,rhonchi or crepitation. No use of accessory muscles of respiration. Decreased bibasilar breath sounds CARDIOVASCULAR: S1, SIrregular irregular, tachycardic . No murmurs, rubs, or gallops.  ABDOMEN: Soft, obese, distended, non tender. Hypoactive Bowel sounds present. No organomegaly or mass.  EXTREMITIES: No pedal edema, cyanosis, or clubbing.  NEUROLOGIC: Cranial nerves II through XII are intact. Muscle strength 5/5 in all extremities. Sensation intact. Gait not checked.  PSYCHIATRIC: The patient is alert and oriented x 3.  SKIN: No obvious rash, lesion, or ulcer.    LABORATORY PANEL:   CBC  Recent Labs Lab 02/17/16 0417  WBC 8.7  HGB 10.8*  HCT 32.5*  PLT 168   ------------------------------------------------------------------------------------------------------------------  Chemistries   Recent Labs Lab 02/19/16 0429 02/20/16 0444  NA 142 144  K 3.1* 3.5  CL 103 105  CO2 33* 29  GLUCOSE 181* 189*  BUN 51* 40*  CREATININE 1.05* 0.95  CALCIUM 7.7* 7.8*  MG 2.1  --    ------------------------------------------------------------------------------------------------------------------  Cardiac  Enzymes No results for input(s): TROPONINI in the last 168  hours. ------------------------------------------------------------------------------------------------------------------  RADIOLOGY:  Dg Chest 1 View  Result Date: 02/20/2016 CLINICAL DATA:  PICC. EXAM: CHEST 1 VIEW COMPARISON:  Four days ago FINDINGS: New left upper extremity PICC with tip at the upper cavoatrial junction. Nasogastric tube at least reaches the stomach. Chronic cardiomegaly. There is no edema, consolidation, effusion, or pneumothorax. IMPRESSION: Left upper extremity PICC with tip at the upper cavoatrial junction. Electronically Signed   By: Marnee Spring M.D.   On: 02/20/2016 14:40   Dg Abd 1 View  Result Date: 02/20/2016 CLINICAL DATA:  Ileus. EXAM: ABDOMEN - 1 VIEW COMPARISON:  One-view abdomen 02/19/2016 FINDINGS: The side port of the NG tube is within the stomach. There is progressive distention of multiple loops of small bowel throughout the abdomen. No definite free air is present on the supine films. IMPRESSION: 1. Progressive distention of multiple loops of small bowel compatible with small bowel obstruction. 2. The side port of the NG tube is within the stomach. Electronically Signed   By: Marin Roberts M.D.   On: 02/20/2016 08:26   Dg Abd 1 View  Result Date: 02/19/2016 CLINICAL DATA:  80 year old female with enteric tube placement. EXAM: ABDOMEN - 1 VIEW COMPARISON:  Abdominal radiograph dated 02/18/2016 FINDINGS: There has been interval advancement of the enteric tube with tip in the left upper abdomen over the gastric bubble, likely in the proximal stomach. Persistent there is distended loops of small bowel noted in the visualized upper abdomen measuring up to 3.5 cm diameter. IMPRESSION: Enteric tube with tip and side-port in the proximal stomach. Persistent mild air distention of small bowel in the upper abdomen. Electronically Signed   By: Elgie Collard M.D.   On: 02/19/2016 02:34    EKG:   Orders placed or performed during the hospital encounter of  02/07/2016  . EKG 12-Lead  . EKG 12-Lead    ASSESSMENT AND PLAN:   [name] a 80 y.o.femalewho presents with Acute onset abdominal pain with coffee-ground emesis and hematochezia, also had hyperglycemia, afib rvr and mental status changes  #1 Acute GI bleed - likely upper and lower both- gastritis and diverticulitis probably - stopped now, hb stable. Appreciate GI consult - CT of the abd with hiatal hernia and diverticulosis- could have gastritis - continue protonix bid, no active rectal bleeding noted now, hemoglobin level is stable   #2 Ileus- with abdominal distention, NG tube placed-increased output today, KUB today revealed progressive distention of multiple loops of small bowel compatible with small bowel obstruction, repeat KUB  tomorrowIn a.m. - continue continuous suction - keep her NPO. Get surgery involved for recommendations, initiate patient on enemas, Dulcolax suppositories, following rectal output .   #3 .Atrial fibrillation with RVR (HCC)discontinue oral Cardizem and metoprolol, initiate patient on Cardizem IV drip, change to amiodarone IV drip if Cardizem does not work  , appreciate cardiology consult -no anticoagulation due to GI bleed (pt was on eliquis in the past-she stopped it several months ago)  #4 UncontrolledDiabetes (HCC)  Due to medication non compliance on admission -NPO currently, started lantus BID as sugars elevated - a1c 11.9, on SSI  #5 ARF- ATN from sepsis,, creatinine has improved, IV fluids have been stopped, resume IV fluids since patient's NG tube output is around 2 L over the past 24 hours . kidneys on the CT abd without any acute findings - avoid nephrotoxins, monitor  #6 Hypokalemia- replaced. Magnesium level normal  #6. DVT Prophylaxis-  TEDs and SCDs SQ heparin    #7. Generalized weakness, Physical Therapy consulted,  SNF at discharge Discharge plans once ileus resolves, likely next week    All the records are reviewed  and case discussed with Care Management/Social Workerr. Management plans discussed with the patient, family and they are in agreement.  CODE STATUS: Full Code  TOTAL TIME TAKING CARE OF THIS PATIENT:  45 minutes.   discussed with Dr. Juliann Pares  POSSIBLE D/C IN 3 DAYS, DEPENDING ON CLINICAL CONDITION.   Katharina Caper M.D on 02/20/2016 at 3:58 PM  Between 7am to 6pm - Pager - [phone]  After 6pm go to [email or web address] - Social research officer, government  [facility]  Office  [phone]  CC: Primary care physician; No PCP Per Patient

## 2016-02-20 NOTE — Progress Notes (Signed)
Pharmacist - Prescriber Communication  Pharmacy consulted to identify and manage drug interactions with amiodarone. Interactions identified with active orders:  1. Amiodarone/morphine sulfate - may result in increased exposure to morphine. 2. Amiodarone/ondansetron - increased risk of QTc prolongation and torsades. 3. Amiodarone/diltiazem - increased risk of bradycardia, AV block, sinus arrest.   Management of drug interactions:  1. Use morphine only PRN 2. Use ondansetron PRN and follow electrolytes (K, Mg, Phos, Ca) 3. Use diltiazem PRN, monitor vitals, may repeat EKG if concern  Harrold DonathNathan A. Tamarackookson, Vermont[place]arm.D., BCPS Clinical Pharmacist 02/20/2016 2215

## 2016-02-20 NOTE — Progress Notes (Signed)
Patient did not want to wear cpap tonight due to ng tube in her nose. She tried previous night but was not able to tolerate due to pain

## 2016-02-21 ENCOUNTER — Inpatient Hospital Stay: Payer: Commercial Managed Care - HMO

## 2016-02-21 ENCOUNTER — Encounter: Payer: Self-pay | Admitting: Radiology

## 2016-02-21 DIAGNOSIS — K567 Ileus, unspecified: Secondary | ICD-10-CM

## 2016-02-21 LAB — GLUCOSE, CAPILLARY
GLUCOSE-CAPILLARY: 176 mg/dL — AB (ref 65–99)
GLUCOSE-CAPILLARY: 201 mg/dL — AB (ref 65–99)
GLUCOSE-CAPILLARY: 138 mg/dL — AB (ref 65–99)
Glucose-Capillary: 176 mg/dL — ABNORMAL HIGH (ref 65–99)
Glucose-Capillary: 180 mg/dL — ABNORMAL HIGH (ref 65–99)
Glucose-Capillary: 195 mg/dL — ABNORMAL HIGH (ref 65–99)

## 2016-02-21 LAB — CBC
HCT: 34.2 % — ABNORMAL LOW (ref 35.0–47.0)
Hemoglobin: 11 g/dL — ABNORMAL LOW (ref 12.0–16.0)
MCH: 30.3 pg (ref 26.0–34.0)
MCHC: 32.2 g/dL (ref 32.0–36.0)
MCV: 93.9 fL (ref 80.0–100.0)
PLATELETS: 209 10*3/uL (ref 150–440)
RBC: 3.64 MIL/uL — ABNORMAL LOW (ref 3.80–5.20)
RDW: 15 % — AB (ref 11.5–14.5)
WBC: 9.7 10*3/uL (ref 3.6–11.0)

## 2016-02-21 LAB — APTT: aPTT: 29 seconds (ref 24–36)

## 2016-02-21 LAB — BASIC METABOLIC PANEL
Anion gap: 10 (ref 5–15)
BUN: 36 mg/dL — AB (ref 6–20)
CHLORIDE: 99 mmol/L — AB (ref 101–111)
CO2: 30 mmol/L (ref 22–32)
CREATININE: 0.97 mg/dL (ref 0.44–1.00)
Calcium: 6.8 mg/dL — ABNORMAL LOW (ref 8.9–10.3)
GFR calc Af Amer: >60 mL/min (ref >60)
GFR calc non Af Amer: 55 mL/min — ABNORMAL LOW (ref >60)
GLUCOSE: 418 mg/dL — AB (ref 65–99)
POTASSIUM: 3.2 mmol/L — AB (ref 3.5–5.1)
Sodium: 139 mmol/L (ref 135–145)

## 2016-02-21 LAB — PROTIME-INR
INR: 1.18
PROTHROMBIN TIME: 15.1 s (ref 11.4–15.2)

## 2016-02-21 MED ORDER — IOPAMIDOL (ISOVUE-300) INJECTION 61%
100.0000 mL | Freq: Once | INTRAVENOUS | Status: AC | PRN
  Administered 2016-02-21: 100 mL via INTRAVENOUS

## 2016-02-21 MED ORDER — IOPAMIDOL (ISOVUE-300) INJECTION 61%: 30.0000 mL | Freq: Once | INTRAVENOUS | Status: DC | PRN

## 2016-02-21 MED ORDER — METOPROLOL TARTRATE 5 MG/5ML IV SOLN
5.0000 mg | Freq: Four times a day (QID) | INTRAVENOUS | Status: DC
  Administered 2016-02-22 – 2016-02-28 (×28): 5 mg via INTRAVENOUS
  Filled 2016-02-21 (×29): qty 5

## 2016-02-21 MED ORDER — DIGOXIN 0.25 MG/ML IJ SOLN
0.5000 mg | Freq: Once | INTRAMUSCULAR | Status: DC
  Filled 2016-02-21: qty 2

## 2016-02-21 MED ORDER — DIGOXIN 0.25 MG/ML IJ SOLN
0.2500 mg | Freq: Four times a day (QID) | INTRAMUSCULAR | Status: AC
  Administered 2016-02-21 – 2016-02-22 (×2): 0.25 mg via INTRAVENOUS
  Filled 2016-02-21 (×2): qty 1

## 2016-02-21 MED ORDER — POTASSIUM CHLORIDE IN NACL 20-0.9 MEQ/L-% IV SOLN
INTRAVENOUS | Status: DC
Start: 2016-02-21 — End: 2016-02-24
  Administered 2016-02-21 – 2016-02-24 (×3): via INTRAVENOUS
  Filled 2016-02-21 (×6): qty 1000

## 2016-02-21 MED ORDER — INSULIN GLARGINE 100 UNIT/ML ~~LOC~~ SOLN
16.0000 [IU] | Freq: Two times a day (BID) | SUBCUTANEOUS | Status: DC
  Administered 2016-02-21 – 2016-02-23 (×4): 16 [IU] via SUBCUTANEOUS
  Filled 2016-02-21 (×5): qty 0.16

## 2016-02-21 NOTE — Progress Notes (Signed)
CT scan reviewed and d/w radiologist dilation of SB w transition point c/w SBO, no free air, no pneumatosis or ischemic bowel. Pt HR still 130 a fib RVR, d/w hospitalist and I would like her to be optimized as much as possible before potential laparotomy tomorrow. I will order a CT scan a/p w/o contrast to see the progression of her SBO if she still has persistent dilation she will require exploration. She is at significant risk given her uncontrolled a fib, dm , obesity and steroids. D/W pt and family in detail about the situation and they are aware of the risks and are in agreement with potential surgery tomorrow if she fails to progress. HE abdomen now is soft, w/o peritoneal signs. No need for emergent intervention tonight.

## 2016-02-21 NOTE — Progress Notes (Signed)
No results from SSE, pt. Was unable to retain any of the fluid, only return was the clear fluid infused. Pt. Is passing gas. Will continue to monitor pt.

## 2016-02-21 NOTE — Progress Notes (Signed)
Patient ID: [name], female   DOB: 02/20/1936, 80 y.o.   MRN: [id number]  HPI [name] is a 80 y.o. female asked to see patient in consultation for prolonged ileus. Patient has medical history significant for atrial fibrillation with a recent episode of GI bleed and that cause her to all her anticoagulation. For about 6-7 days the patient states that she's been having intermittent diffuse abdominal pain, dull in nature and moderate in intensity. She has also had some nausea and emesis. An NG tube has been placed with significant output and serial x-rays have been done showing persistent and dilation of the small bowel with an air within the colon. She reports that she did pass gas yesterday. Her white count has been normal as well as her creatinine. Her only past abdominal surgery includes a hysterectomy. She does have uncontrolled diabetes, she is morbidly obese and uses a walker. She does have some dyspnea on exertion. Currently she is in an amiodarone drip to control her heart rate. I have personally reviewed the CT scan initially that was normal as well as multiple x-ray showing evidence of dilated loops of small bowel with also air within the colon suggestive of partial small bowel obstruction. Echo showing preserved lv fx HPI  Past Medical History:  Diagnosis Date  . Atrial fibrillation (HCC)   . Diabetes mellitus without complication (HCC)   . Hypertension     Past Surgical History:  Procedure Laterality Date  . ABDOMINAL HYSTERECTOMY      Family History  Problem Relation Age of Onset  . Diabetes Mother   . Kidney failure Mother   . Diabetes Father   . Kidney failure Father   . Stroke Father     Social History Social History  Substance Use Topics  . Smoking status: Never Smoker  . Smokeless tobacco: Never Used  . Alcohol use No    Allergies  Allergen Reactions  . Demerol [Meperidine] Nausea And Vomiting  . Reglan [Metoclopramide] Swelling  . Penicillins  Swelling and Rash    Swelling to arm at injection site. Has patient had a PCN reaction causing immediate rash, facial/tongue/throat swelling, SOB or lightheadedness with hypotension: Yes Has patient had a PCN reaction causing severe rash involving mucus membranes or skin necrosis: No Has patient had a PCN reaction that required hospitalization No Has patient had a PCN reaction occurring within the last 10 years: No If all of the above answers are "NO", then may proceed with Cephalosporin use.     Current Facility-Administered Medications  Medication Dose Route Frequency Provider Last Rate Last Dose  . 0.9 % NaCl with KCl 20 mEq/ L  infusion   Intravenous Continuous Katharina Caper, MD 75 mL/hr at 02/21/16 1216    . acetaminophen (TYLENOL) tablet 650 mg  650 mg Oral Q6H PRN Enid Baas, MD      . amiodarone (NEXTERONE PREMIX) 360-4.14 MG/200ML-% (1.8 mg/mL) IV infusion  30 mg/hr Intravenous Continuous Katharina Caper, MD 16.7 mL/hr at 02/21/16 1004 30 mg/hr at 02/21/16 1004  . bisacodyl (DULCOLAX) suppository 10 mg  10 mg Rectal Daily Katharina Caper, MD   10 mg at 02/21/16 0952  . diltiazem (CARDIZEM) injection 10 mg  10 mg Intravenous Q4H PRN Ihor Austin, MD   10 mg at 02/20/16 0739  . heparin injection 5,000 Units  5,000 Units Subcutaneous Q8H Shane Crutch, MD   5,000 Units at 02/21/16 0515  . insulin aspart (novoLOG) injection 0-9 Units  0-9 Units Subcutaneous  Q6H Enid Baas, MD   2 Units at 02/21/16 0535  . insulin glargine (LANTUS) injection 14 Units  14 Units Subcutaneous BID Enid Baas, MD   14 Units at 02/21/16 1216  . iopamidol (ISOVUE-300) 61 % injection 30 mL  30 mL Oral Once PRN Katharina Caper, MD      . metoprolol (LOPRESSOR) injection 5 mg  5 mg Intravenous Q6H PRN Enid Baas, MD   5 mg at 02/20/16 0814  . morphine 2 MG/ML injection 2 mg  2 mg Intravenous Q4H PRN Oralia Manis, MD   2 mg at 02/15/16 2103  . ondansetron (ZOFRAN) tablet 4 mg  4 mg Oral  Q6H PRN Oralia Manis, MD       Or  . ondansetron [facility]) injection 4 mg  4 mg Intravenous Q6H PRN Oralia Manis, MD   4 mg at 02/13/16 0738  . pantoprazole (PROTONIX) EC tablet 40 mg  40 mg Oral BID Enid Baas, MD   40 mg at 02/21/16 0952  . predniSONE (DELTASONE) tablet 40 mg  40 mg Oral Q breakfast Enid Baas, MD   40 mg at 02/21/16 0806  . senna-docusate (Senokot-S) tablet 1 tablet  1 tablet Oral BID Enid Baas, MD   1 tablet at 02/21/16 [phone]  . sodium chloride flush (NS) 0.9 % injection 3 mL  3 mL Intravenous Q12H Oralia Manis, MD   3 mL at 02/21/16 9604     Review of Systems A 10 point review of systems was asked and was negative except for the information on the HPI  Physical Exam Blood pressure (!) 154/84, pulse (!) 125, temperature 97.8 F (36.6 C), temperature source Oral, resp. rate 18, height 5\' 7"  (1.702 m), weight (!) 138.5 kg (305 lb 6.4 oz), SpO2 96 %. CONSTITUTIONAL: Ordered to be obese female in no acute distress. Awake and alert EYES: Pupils are equal, round, and reactive to light, Sclera are non-icteric. EARS, NOSE, MOUTH AND THROAT: The oropharynx is clear. The oral mucosa is pink and moist. Hearing is intact to voice. LYMPH NODES:  Lymph nodes in the neck are normal. RESPIRATORY:  Lungs are clear. There is normal respiratory effort, with equal breath sounds bilaterally, and without pathologic use of accessory muscles. CARDIOVASCULAR: Heart is regular without murmurs, gallops, or rubs. GI: The abdomen is  soft, nontender, mildly distended. No peritonitis no rebound and no abdominal hernias GU: Rectal deferred.   MUSCULOSKELETAL: Normal muscle strength and tone. No cyanosis or edema.   SKIN: Turgor is good and there are no pathologic skin lesions or ulcers. NEUROLOGIC: Motor and sensation is grossly normal. Cranial nerves are grossly intact. PSYCH:  Oriented to person, place and time. Affect is normal.  Data Reviewed I have personally reviewed the  patient's imaging, laboratory findings and medical records.    Assessment  Plan 80 year old morbidly obese female with multiple comorbidities including persistent A. fib, uncontrolled diabetes now with persistent ileus. This is likely related to her medical condition. I will order a repeat CT scan of the abdomen and pelvis with by mouth and IV contrast to reassess any potential mechanical obstruction. Current she is passing gas she is in no acute distress and he no need for any emergent surgical intervention. I will order some coags to make sure that she is not anticoagulated and will reviewed the new CT scan as well. We will continue to follow  I have also requested TPN by dietitian since I do think that she will benefit from it given  her prolonged nothing by mouth status  Sterling Bigiego Pabon, MD FACS General Surgeon 02/21/2016, 12:24 PM

## 2016-02-21 NOTE — Progress Notes (Signed)
1st bottle of contrast in NGT clamped

## 2016-02-21 NOTE — Progress Notes (Signed)
Pt mentation is much better she is alert responding appropriately making needs known. Family comes to visit short stays and then leaves. She receives phone calls and is able to answer the phone and hold the phone to communicate

## 2016-02-21 NOTE — Progress Notes (Signed)
[employer] [facility] [place] [facility]   PATIENT NAME: [name]    MR#:  [id number]  DATE OF BIRTH:  August 14, 1935  SUBJECTIVE:  CHIEF COMPLAINT:   Chief Complaint  Patient presents with  . Diarrhea  . Abdominal Pain  . Emesis   - The patient remains in A. fib, rate is 120, now on amiodarone IV drip, Cardizem, metoprolol, IV,  NGT  drainage about 1 L over the past 24 hours. No stool . Denies nausea, abdominal pain and feels somewhat hungry. No  stool even after enema today. Surgical consultation is pending    REVIEW OF SYSTEMS:  Review of Systems  Constitutional: Positive for malaise/fatigue. Negative for chills and fever.  HENT: Negative for ear discharge, ear pain and tinnitus.   Eyes: Negative for blurred vision and double vision.  Respiratory: Positive for shortness of breath. Negative for cough and wheezing.   Cardiovascular: Negative for chest pain, palpitations and leg swelling.  Gastrointestinal: Negative for abdominal pain, constipation, diarrhea, nausea and vomiting.       Abdominal distention  Genitourinary: Negative for dysuria and urgency.  Musculoskeletal: Negative for myalgias.  Neurological: Negative for dizziness, sensory change, speech change, focal weakness, seizures and headaches.  Psychiatric/Behavioral: Negative for depression.    DRUG ALLERGIES:   Allergies  Allergen Reactions  . Demerol [Meperidine] Nausea And Vomiting  . Reglan [Metoclopramide] Swelling  . Penicillins Swelling and Rash    Swelling to arm at injection site. Has patient had a PCN reaction causing immediate rash, facial/tongue/throat swelling, SOB or lightheadedness with hypotension: Yes Has patient had a PCN reaction causing severe rash involving mucus membranes or skin necrosis: No Has patient had a PCN reaction that required hospitalization No Has patient had a PCN reaction occurring within the last 10 years: No If all of the above answers are "NO", then may  proceed with Cephalosporin use.     VITALS:  Blood pressure (!) 154/84, pulse (!) 125, temperature 97.8 F (36.6 C), temperature source Oral, resp. rate 18, height 5\' 7"  (1.702 m), weight (!) 138.5 kg (305 lb 6.4 oz), SpO2 96 %.  PHYSICAL EXAMINATION:  Physical Exam  GENERAL:  80 y.o.-year-old patient lying in the bed In mild to moderate distress, somewhat uncomfortable, disheveled.  EYES: Pupils equal, round, reactive to light and accommodation. No scleral icterus. Extraocular muscles intact.  HEENT: Head atraumatic, normocephalic. Oropharynx and nasopharynx clear. NG tube in place with continuous suction NECK:  Supple, no jugular venous distention. No thyroid enlargement, no tenderness.  LUNGS: Somewhat diminished breath sounds bilaterally at bases, no wheezing, rales,rhonchi or crepitation. No use of accessory muscles of respiration. Decreased bibasilar breath sounds CARDIOVASCULAR: S1, SIrregular irregular, tachycardic . No murmurs, rubs, or gallops.  ABDOMEN: Soft, obese, distended, non tender. Hypoactive Bowel sounds present. No organomegaly or mass. No guarding EXTREMITIES: No pedal edema, cyanosis, or clubbing.  NEUROLOGIC: Cranial nerves II through XII are intact. Muscle strength 5/5 in all extremities. Sensation intact. Gait not checked.  PSYCHIATRIC: The patient is alert and oriented x 3.  SKIN: No obvious rash, lesion, or ulcer.    LABORATORY PANEL:   CBC  Recent Labs Lab 02/21/16 0444  WBC 9.7  HGB 11.0*  HCT 34.2*  PLT 209   ------------------------------------------------------------------------------------------------------------------  Chemistries   Recent Labs Lab 02/19/16 0429  02/21/16 0444  NA 142  < > 139  K 3.1*  < > 3.2*  CL 103  < > 99*  CO2 33*  < >  30  GLUCOSE 181*  < > 418*  BUN 51*  < > 36*  CREATININE 1.05*  < > 0.97  CALCIUM 7.7*  < > 6.8*  MG 2.1  --   --   < > = values in this interval not  displayed. ------------------------------------------------------------------------------------------------------------------  Cardiac Enzymes No results for input(s): TROPONINI in the last 168 hours. ------------------------------------------------------------------------------------------------------------------  RADIOLOGY:  Dg Chest 1 View  Result Date: 02/20/2016 CLINICAL DATA:  PICC. EXAM: CHEST 1 VIEW COMPARISON:  Four days ago FINDINGS: New left upper extremity PICC with tip at the upper cavoatrial junction. Nasogastric tube at least reaches the stomach. Chronic cardiomegaly. There is no edema, consolidation, effusion, or pneumothorax. IMPRESSION: Left upper extremity PICC with tip at the upper cavoatrial junction. Electronically Signed   By: Marnee SpringJonathon  Watts M.D.   On: 02/20/2016 14:40   Dg Abd 1 View  Result Date: 02/21/2016 CLINICAL DATA:  80 year old female with history of abdominal pain. Ileus. Weakness. EXAM: ABDOMEN - 1 VIEW COMPARISON:  Abdominal radiograph 02/20/2016. FINDINGS: Nasogastric tube with tip in the mid body of the stomach. Stomach is decompressed. Marked dilatation of small bowel loops measuring up to 6.6 cm in the left upper quadrant of the abdomen. Paucity of distal colonic gas, although some gas is noted in the colon. No gross evidence of pneumoperitoneum. IMPRESSION: 1. The appearance of the abdomen is again suggestive of partial small bowel obstruction, which appears to a worsening compared to prior study 02/20/2016. 2. Tip of nasogastric tube is in the mid body of the stomach. Electronically Signed   By: Trudie Reedaniel  Entrikin M.D.   On: 02/21/2016 09:09   Dg Abd 1 View  Result Date: 02/20/2016 CLINICAL DATA:  Ileus. EXAM: ABDOMEN - 1 VIEW COMPARISON:  One-view abdomen 02/19/2016 FINDINGS: The side port of the NG tube is within the stomach. There is progressive distention of multiple loops of small bowel throughout the abdomen. No definite free air is present on the  supine films. IMPRESSION: 1. Progressive distention of multiple loops of small bowel compatible with small bowel obstruction. 2. The side port of the NG tube is within the stomach. Electronically Signed   By: Marin Robertshristopher  Mattern M.D.   On: 02/20/2016 08:26    EKG:   Orders placed or performed during the hospital encounter of 02/21/2016  . EKG 12-Lead  . EKG 12-Lead    ASSESSMENT AND PLAN:   BobbieMilburnis a 80 y.o.femalewho presents with Acute onset abdominal pain with coffee-ground emesis and hematochezia, also had hyperglycemia, afib rvr and mental status changes  #1 Acute GI bleed - likely upper and lower both- gastritis and diverticulitis  - stopped now, hb stable. Appreciate GI consult - CT of the abd with hiatal hernia and diverticulosis- could have gastritis - continue protonix bid, no active rectal bleeding noted now, hemoglobin level is stable   #2 Ileus versus small bowel obstruction- with abdominal distention, NG tube placed, KUB today revealed progressive distention of multiple loops of small bowel compatible with small bowel obstruction, worsening overall, surgical consultation is requested, pending , repeat KUB  Tomorrow in a.m. - continue continuous suction - keep her NPO. Continue  Dulcolax suppositories, unfortunately, no rectal output is recorded .   #3 .Atrial fibrillation with RVR (HCC), now off oral Cardizem and metoprolol, continue patient on Cardizem IV , metoprolol IV intermittently as needed , continue amiodarone IV drip , add digoxin if needed , appreciate cardiology consult, echocardiogram done on this admission was normal, except mild valvular abnormalities -  no anticoagulation due to GI bleed (pt was on eliquis in the past-she stopped it several months ago)  #4 UncontrolledDiabetes (HCC)  Due to medication non compliance on admission -NPO currently, started lantus BID as sugars elevated - a1c 11.9, on SSI, blood glucose levels are ranging between  180-230, advance insulin Lantus to 16 units twice a day  #5 ARF- ATN from sepsis,, creatinine has improved on  IV fluids, continue IV fluids since patient's NG tube output is around 3 L over the past 48 hours . kidneys on the CT abd without any acute findings - avoid nephrotoxins, monitor  #6 Hypokalemia- replaced. Magnesium level normal  #6. DVT Prophylaxis- TEDs and SCDs SQ heparin    #7. Generalized weakness, Physical Therapy consulted,  SNF at discharge Discharge plans once ileus resolves, likely next week    All the records are reviewed and case discussed with Care Management/Social Workerr. Management plans discussed with the patient, family and they are in agreement.  CODE STATUS: Full Code  TOTAL TIME TAKING CARE OF THIS PATIENT:  45 minutes.   discussed with patient's son POSSIBLE D/C IN 3 DAYS, DEPENDING ON CLINICAL CONDITION.   Katharina Caper M.D on 02/21/2016 at 1:40 PM  Between 7am to 6pm - Pager - [phone]  After 6pm go to [email or web address] - Social research officer, government  [facility]  Office  [phone]  CC: Primary care physician; No PCP Per Patient

## 2016-02-21 NOTE — Progress Notes (Signed)
Brief Nutrition Note  Consult received for parenteral nutrition. Central Access: PICC, triple Lumen, L brachial  Pharmacy to initiate Clinimix 5%AA/15%Dextrose at 2140ml/hr with MVI and Trace Minerals at this time.  Full assessment and further recommendations to follow.  Admitting Dx: Metabolic acidosis [E87.2] Upper GI bleed [K92.2] Atrial fibrillation with rapid ventricular response (HCC) [I48.91] Acute respiratory failure with hypoxia (HCC) [J96.01] Nausea vomiting and diarrhea [R11.2, R19.7]  Body mass index is 47.83 kg/m. Pt meets criteria for morbidly obese based on current BMI.  Labs:   Recent Labs Lab 02/19/16 0429 02/20/16 0444 02/21/16 0444  NA 142 144 139  K 3.1* 3.5 3.2*  CL 103 105 99*  CO2 33* 29 30  BUN 51* 40* 36*  CREATININE 1.05* 0.95 0.97  CALCIUM 7.7* 7.8* 6.8*  MG 2.1  --   --     Christophe LouisNathan Veryl Abril RD, LDN, CNSC Clinical Nutrition Pager: [phone] 02/21/2016 12:28 PM

## 2016-02-21 NOTE — Progress Notes (Signed)
Supository given. Pt went down for abd xray and is to have a ct of abd. With contrast

## 2016-02-21 NOTE — Progress Notes (Signed)
HR maintained above 130 for over an hour metop 5mg  given Iv. Pt remains on amniodarone gtt. Went down for CT waiting for results. Pt is alert abd is large but soft.

## 2016-02-21 NOTE — Progress Notes (Signed)
Consent has been signed and placed on front of chart. Sonia SideMichelle Jesstin Studstill Rn

## 2016-02-21 NOTE — Progress Notes (Signed)
Pt. Continues on amiodarone drip at 16.7, HR has fluctuated from one teens to one thirty throughout the night. Continues NPO with minimal chips. NG tube in place with minimal drainage.

## 2016-02-22 ENCOUNTER — Encounter: Admission: EM | Disposition: E | Payer: Self-pay | Source: Home / Self Care | Attending: Internal Medicine

## 2016-02-22 ENCOUNTER — Inpatient Hospital Stay: Payer: Commercial Managed Care - HMO

## 2016-02-22 DIAGNOSIS — K56609 Unspecified intestinal obstruction, unspecified as to partial versus complete obstruction: Secondary | ICD-10-CM

## 2016-02-22 DIAGNOSIS — I10 Essential (primary) hypertension: Secondary | ICD-10-CM

## 2016-02-22 DIAGNOSIS — I4891 Unspecified atrial fibrillation: Secondary | ICD-10-CM

## 2016-02-22 DIAGNOSIS — K551 Chronic vascular disorders of intestine: Secondary | ICD-10-CM

## 2016-02-22 DIAGNOSIS — R1013 Epigastric pain: Secondary | ICD-10-CM

## 2016-02-22 HISTORY — PX: PERIPHERAL VASCULAR CATHETERIZATION: SHX172C

## 2016-02-22 LAB — GLUCOSE, CAPILLARY
GLUCOSE-CAPILLARY: 103 mg/dL — AB (ref 65–99)
GLUCOSE-CAPILLARY: 107 mg/dL — AB (ref 65–99)
GLUCOSE-CAPILLARY: 123 mg/dL — AB (ref 65–99)
GLUCOSE-CAPILLARY: 166 mg/dL — AB (ref 65–99)
GLUCOSE-CAPILLARY: 94 mg/dL (ref 65–99)
Glucose-Capillary: 105 mg/dL — ABNORMAL HIGH (ref 65–99)
Glucose-Capillary: 108 mg/dL — ABNORMAL HIGH (ref 65–99)
Glucose-Capillary: 197 mg/dL — ABNORMAL HIGH (ref 65–99)

## 2016-02-22 LAB — HEPATIC FUNCTION PANEL
ALT: 8 U/L — ABNORMAL LOW (ref 14–54)
AST: 12 U/L — AB (ref 15–41)
Albumin: 1.5 g/dL — ABNORMAL LOW (ref 3.5–5.0)
Alkaline Phosphatase: 49 U/L (ref 38–126)
BILIRUBIN DIRECT: 0.2 mg/dL (ref 0.1–0.5)
Indirect Bilirubin: 0.6 mg/dL (ref 0.3–0.9)
Total Bilirubin: 0.8 mg/dL (ref 0.3–1.2)
Total Protein: 5.3 g/dL — ABNORMAL LOW (ref 6.5–8.1)

## 2016-02-22 LAB — CBC
HCT: 33.3 % — ABNORMAL LOW (ref 35.0–47.0)
Hemoglobin: 10.7 g/dL — ABNORMAL LOW (ref 12.0–16.0)
MCH: 29.8 pg (ref 26.0–34.0)
MCHC: 32.2 g/dL (ref 32.0–36.0)
MCV: 92.6 fL (ref 80.0–100.0)
PLATELETS: 222 10*3/uL (ref 150–440)
RBC: 3.59 MIL/uL — ABNORMAL LOW (ref 3.80–5.20)
RDW: 14.6 % — ABNORMAL HIGH (ref 11.5–14.5)
WBC: 8.7 10*3/uL (ref 3.6–11.0)

## 2016-02-22 LAB — BASIC METABOLIC PANEL
Anion gap: 4 — ABNORMAL LOW (ref 5–15)
BUN: 27 mg/dL — ABNORMAL HIGH (ref 6–20)
CALCIUM: 6.5 mg/dL — AB (ref 8.9–10.3)
CO2: 28 mmol/L (ref 22–32)
CREATININE: 0.88 mg/dL (ref 0.44–1.00)
Chloride: 109 mmol/L (ref 101–111)
GLUCOSE: 143 mg/dL — AB (ref 65–99)
Potassium: 4.6 mmol/L (ref 3.5–5.1)
Sodium: 141 mmol/L (ref 135–145)

## 2016-02-22 LAB — LACTIC ACID, PLASMA: LACTIC ACID, VENOUS: 0.9 mmol/L (ref 0.5–1.9)

## 2016-02-22 LAB — HEPARIN LEVEL (UNFRACTIONATED): Heparin Unfractionated: 0.32 IU/mL (ref 0.30–0.70)

## 2016-02-22 SURGERY — LAPAROTOMY, EXPLORATORY
Anesthesia: General

## 2016-02-22 SURGERY — Surgical Case
Anesthesia: *Unknown

## 2016-02-22 SURGERY — ABDOMINAL AORTOGRAM
Anesthesia: Moderate Sedation

## 2016-02-22 MED ORDER — IOPAMIDOL (ISOVUE-300) INJECTION 61%
INTRAVENOUS | Status: DC | PRN
  Administered 2016-02-22: 130 mL via INTRA_ARTERIAL

## 2016-02-22 MED ORDER — MIDAZOLAM HCL 2 MG/2ML IJ SOLN
INTRAMUSCULAR | Status: AC
  Filled 2016-02-22: qty 2

## 2016-02-22 MED ORDER — INSULIN ASPART 100 UNIT/ML ~~LOC~~ SOLN: 0.0000 [IU] | SUBCUTANEOUS | Status: DC

## 2016-02-22 MED ORDER — DEXTROSE 5 % IV SOLN
INTRAVENOUS | Status: AC
  Filled 2016-02-22: qty 1.5

## 2016-02-22 MED ORDER — CHLORHEXIDINE GLUCONATE CLOTH 2 % EX PADS
6.0000 | MEDICATED_PAD | Freq: Once | CUTANEOUS | Status: AC
  Administered 2016-02-22: 6 via TOPICAL

## 2016-02-22 MED ORDER — CIPROFLOXACIN IN D5W 400 MG/200ML IV SOLN
400.0000 mg | Freq: Two times a day (BID) | INTRAVENOUS | Status: DC
  Administered 2016-02-22 – 2016-02-29 (×15): 400 mg via INTRAVENOUS
  Filled 2016-02-22 (×16): qty 200

## 2016-02-22 MED ORDER — TRACE MINERALS CR-CU-MN-SE-ZN 10-1000-500-60 MCG/ML IV SOLN
INTRAVENOUS | Status: DC
  Administered 2016-02-22: 19:00:00 via INTRAVENOUS
  Filled 2016-02-22: qty 840

## 2016-02-22 MED ORDER — MIDAZOLAM HCL 2 MG/2ML IJ SOLN
INTRAMUSCULAR | Status: DC | PRN
  Administered 2016-02-22: 2 mg via INTRAVENOUS
  Administered 2016-02-22: 1 mg via INTRAVENOUS

## 2016-02-22 MED ORDER — METRONIDAZOLE IN NACL 5-0.79 MG/ML-% IV SOLN
500.0000 mg | Freq: Three times a day (TID) | INTRAVENOUS | Status: DC
  Administered 2016-02-22 – 2016-02-29 (×21): 500 mg via INTRAVENOUS
  Filled 2016-02-22 (×25): qty 100

## 2016-02-22 MED ORDER — FENTANYL CITRATE (PF) 100 MCG/2ML IJ SOLN
INTRAMUSCULAR | Status: AC
  Filled 2016-02-22: qty 2

## 2016-02-22 MED ORDER — FAT EMULSION 20 % IV EMUL
500.0000 mL | INTRAVENOUS | Status: DC
  Filled 2016-02-22: qty 500

## 2016-02-22 MED ORDER — FENTANYL CITRATE (PF) 100 MCG/2ML IJ SOLN
INTRAMUSCULAR | Status: DC | PRN
  Administered 2016-02-22 (×3): 50 ug via INTRAVENOUS

## 2016-02-22 MED ORDER — METRONIDAZOLE IN NACL 5-0.79 MG/ML-% IV SOLN
500.0000 mg | INTRAVENOUS | Status: DC
  Filled 2016-02-22: qty 100

## 2016-02-22 MED ORDER — HEPARIN (PORCINE) IN NACL 100-0.45 UNIT/ML-% IJ SOLN
2250.0000 [IU]/h | INTRAMUSCULAR | Status: DC
  Administered 2016-02-22: 1350 [IU]/h via INTRAVENOUS
  Administered 2016-02-23 – 2016-02-24 (×2): 1650 [IU]/h via INTRAVENOUS
  Administered 2016-02-25: 1950 [IU]/h via INTRAVENOUS
  Administered 2016-02-26: 2250 [IU]/h via INTRAVENOUS
  Administered 2016-02-26: 1950 [IU]/h via INTRAVENOUS
  Administered 2016-02-27 – 2016-02-29 (×5): 2250 [IU]/h via INTRAVENOUS
  Filled 2016-02-22 (×16): qty 250

## 2016-02-22 MED ORDER — HEPARIN SODIUM (PORCINE) 1000 UNIT/ML IJ SOLN
INTRAMUSCULAR | Status: DC | PRN
  Administered 2016-02-22: 4000 [IU] via INTRAVENOUS

## 2016-02-22 MED ORDER — CIPROFLOXACIN IN D5W 400 MG/200ML IV SOLN
400.0000 mg | INTRAVENOUS | Status: DC
  Filled 2016-02-22: qty 200

## 2016-02-22 MED ORDER — DIGOXIN 0.25 MG/ML IJ SOLN
0.1250 mg | Freq: Every day | INTRAMUSCULAR | Status: DC
  Administered 2016-02-22 – 2016-02-23 (×2): 0.125 mg via INTRAVENOUS
  Filled 2016-02-22 (×2): qty 0.5

## 2016-02-22 MED ORDER — HEPARIN SODIUM (PORCINE) 1000 UNIT/ML IJ SOLN
INTRAMUSCULAR | Status: AC
  Filled 2016-02-22: qty 1

## 2016-02-22 MED ORDER — HEPARIN BOLUS VIA INFUSION
2300.0000 [IU] | Freq: Once | INTRAVENOUS | Status: DC
  Filled 2016-02-22: qty 2300

## 2016-02-22 MED ORDER — INSULIN ASPART 100 UNIT/ML ~~LOC~~ SOLN
0.0000 [IU] | SUBCUTANEOUS | Status: DC
  Administered 2016-02-22: 2 [IU] via SUBCUTANEOUS
  Administered 2016-02-23: 8 [IU] via SUBCUTANEOUS
  Administered 2016-02-23 (×3): 3 [IU] via SUBCUTANEOUS
  Administered 2016-02-23: 5 [IU] via SUBCUTANEOUS
  Administered 2016-02-23 (×2): 3 [IU] via SUBCUTANEOUS
  Administered 2016-02-24: 8 [IU] via SUBCUTANEOUS
  Administered 2016-02-24: 11 [IU] via SUBCUTANEOUS
  Administered 2016-02-24 (×3): 5 [IU] via SUBCUTANEOUS
  Administered 2016-02-24: 8 [IU] via SUBCUTANEOUS
  Administered 2016-02-25 (×3): 5 [IU] via SUBCUTANEOUS
  Filled 2016-02-22: qty 5
  Filled 2016-02-22: qty 11
  Filled 2016-02-22: qty 15
  Filled 2016-02-22: qty 3
  Filled 2016-02-22: qty 5
  Filled 2016-02-22: qty 3
  Filled 2016-02-22: qty 2
  Filled 2016-02-22: qty 8
  Filled 2016-02-22 (×2): qty 5
  Filled 2016-02-22: qty 3
  Filled 2016-02-22: qty 8
  Filled 2016-02-22 (×2): qty 5
  Filled 2016-02-22: qty 3
  Filled 2016-02-22: qty 8
  Filled 2016-02-22: qty 5
  Filled 2016-02-22: qty 3

## 2016-02-22 MED ORDER — ONDANSETRON HCL 4 MG/2ML IJ SOLN
INTRAMUSCULAR | Status: AC
  Filled 2016-02-22: qty 2

## 2016-02-22 SURGICAL SUPPLY — 44 items
APPLIER CLIP 11 MED OPEN (CLIP) ×3
APPLIER CLIP 13 LRG OPEN (CLIP) ×3
BLADE CLIPPER SURG (BLADE) ×3 IMPLANT
BLADE SURG 15 STRL LF DISP TIS (BLADE) ×1 IMPLANT
BLADE SURG 15 STRL SS (BLADE) ×2
CANISTER SUCT 3000ML (MISCELLANEOUS) ×3 IMPLANT
CHLORAPREP W/TINT 26ML (MISCELLANEOUS) ×3 IMPLANT
CLIP APPLIE 11 MED OPEN (CLIP) ×1 IMPLANT
CLIP APPLIE 13 LRG OPEN (CLIP) ×1 IMPLANT
DRAPE LAPAROTOMY 100X77 ABD (DRAPES) ×3 IMPLANT
DRAPE TABLE BACK 80X90 (DRAPES) ×3 IMPLANT
DRSG TEGADERM 2-3/8X2-3/4 SM (GAUZE/BANDAGES/DRESSINGS) ×6 IMPLANT
DRSG TELFA 3X8 NADH (GAUZE/BANDAGES/DRESSINGS) ×3 IMPLANT
ELECT BLADE 6.5 EXT (BLADE) ×3 IMPLANT
ELECT REM PT RETURN 9FT ADLT (ELECTROSURGICAL) ×3
ELECTRODE REM PT RTRN 9FT ADLT (ELECTROSURGICAL) ×1 IMPLANT
GAUZE SPONGE 4X4 12PLY STRL (GAUZE/BANDAGES/DRESSINGS) ×6 IMPLANT
GLOVE BIO SURGEON STRL SZ7 (GLOVE) ×3 IMPLANT
GOWN STRL REUS W/ TWL LRG LVL3 (GOWN DISPOSABLE) ×2 IMPLANT
GOWN STRL REUS W/TWL LRG LVL3 (GOWN DISPOSABLE) ×4
HANDLE SUCTION POOLE (INSTRUMENTS) ×1 IMPLANT
HANDLE YANKAUER SUCT BULB TIP (MISCELLANEOUS) ×3 IMPLANT
LIGASURE IMPACT 36 18CM CVD LR (INSTRUMENTS) IMPLANT
NEEDLE HYPO 22GX1.5 SAFETY (NEEDLE) ×6 IMPLANT
NEEDLE HYPO 25X1 1.5 SAFETY (NEEDLE) ×3 IMPLANT
PACK BASIN MAJOR ARMC (MISCELLANEOUS) ×3 IMPLANT
RELOAD PROXIMATE 75MM BLUE (ENDOMECHANICALS) IMPLANT
SPONGE LAP 18X18 5 PK (GAUZE/BANDAGES/DRESSINGS) ×3 IMPLANT
SPONGE LAP 18X36 2PK (MISCELLANEOUS) IMPLANT
STAPLER PROXIMATE 75MM BLUE (STAPLE) IMPLANT
STAPLER SKIN PROX 35W (STAPLE) ×3 IMPLANT
SUCTION POOLE HANDLE (INSTRUMENTS) ×3
SUT PDS AB 1 TP1 96 (SUTURE) ×6 IMPLANT
SUT SILK 2 0 (SUTURE) ×2
SUT SILK 2 0 SH CR/8 (SUTURE) ×3 IMPLANT
SUT SILK 2 0SH CR/8 30 (SUTURE) ×3 IMPLANT
SUT SILK 2-0 18XBRD TIE 12 (SUTURE) ×1 IMPLANT
SUT VIC AB 0 CT1 36 (SUTURE) ×6 IMPLANT
SUT VIC AB 2-0 SH 27 (SUTURE) ×4
SUT VIC AB 2-0 SH 27XBRD (SUTURE) ×2 IMPLANT
SYR 30ML LL (SYRINGE) ×6 IMPLANT
SYR 3ML LL SCALE MARK (SYRINGE) ×3 IMPLANT
TAPE MICROFOAM 4IN (TAPE) ×3 IMPLANT
TRAY FOLEY W/METER SILVER 16FR (SET/KITS/TRAYS/PACK) ×3 IMPLANT

## 2016-02-22 SURGICAL SUPPLY — 25 items
BALLN LUTONIX DCB 4X60X130 (BALLOONS) ×2
BALLN LUTONIX DCB 5X60X130 (BALLOONS) ×2
BALLN ULTRVRSE 3X40X130C (BALLOONS) ×2
BALLOON LUTONIX DCB 4X60X130 (BALLOONS) ×1 IMPLANT
BALLOON LUTONIX DCB 5X60X130 (BALLOONS) ×1 IMPLANT
BALLOON ULTRVRSE 3X40X130C (BALLOONS) ×1 IMPLANT
CATH ANGIO 5F 80CM MHK1-H (CATHETERS) ×2 IMPLANT
CATH KA2 5FR 65CM (CATHETERS) ×2 IMPLANT
CATH PIG 70CM (CATHETERS) ×2 IMPLANT
CATH RDC 6F (CATHETERS) ×2 IMPLANT
DEVICE PRESTO INFLATION (MISCELLANEOUS) ×2 IMPLANT
DEVICE SOLENT OMNI 120CM (CATHETERS) ×2 IMPLANT
DEVICE STARCLOSE SE CLOSURE (Vascular Products) ×2 IMPLANT
DEVICE TORQUE (MISCELLANEOUS) ×4 IMPLANT
GUIDEWIRE ZIPWIRE .035X180CM (WIRE) ×2 IMPLANT
PACK ANGIOGRAPHY (CUSTOM PROCEDURE TRAY) ×2 IMPLANT
SET INTRO CAPELLA COAXIAL (SET/KITS/TRAYS/PACK) ×2 IMPLANT
SHEATH BRITE TIP 5FRX11 (SHEATH) ×2 IMPLANT
SHEATH BRITE TIP 6FRX11 (SHEATH) ×2 IMPLANT
SHEATH HIGHFLEX ANSEL 6FRX55 (SHEATH) ×2 IMPLANT
SYR MEDRAD MARK V 150ML (SYRINGE) ×2 IMPLANT
TUBING CONTRAST HIGH PRESS 72 (TUBING) ×2 IMPLANT
WIRE J 3MM .035X145CM (WIRE) ×2 IMPLANT
WIRE MAGIC TORQUE 260C (WIRE) ×2 IMPLANT
WIRE ROSEN-J .035X260CM (WIRE) ×2 IMPLANT

## 2016-02-22 NOTE — Progress Notes (Signed)
Pt down to Ct. Family at bedsdie

## 2016-02-22 NOTE — Progress Notes (Signed)
[facility] [place] [facility]   PATIENT NAME: [name]    MR#:  [id number]  DATE OF BIRTH:  06/04/1935  SUBJECTIVE:  CHIEF COMPLAINT:   Chief Complaint  Patient presents with  . Diarrhea  . Abdominal Pain  . Emesis   - The patient remains in A. fib, rate is 120, now on amiodarone IV drip, Cardizem, metoprolol, IV,  NGT  drainage about 1 L over the past 24 hours. Every stool output with an enema, pain with an enema, otherwise abdominal pain is not existent. Milder NG tube drainage, of about 400 ml today. No nausea. 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 Lactic acid level is normal. As was surgery is consulted, prolonged discussion with Dr. Everlene FarrierPabon as well about management. Heart rate is better controlled on amiodarone and metoprolol around the clock, also digoxin.    REVIEW OF SYSTEMS:  Review of Systems  Constitutional: Positive for malaise/fatigue. Negative for chills and fever.  HENT: Negative for ear discharge, ear pain and tinnitus.   Eyes: Negative for blurred vision and double vision.  Respiratory: Positive for shortness of breath. Negative for cough and wheezing.   Cardiovascular: Negative for chest pain, palpitations and leg swelling.  Gastrointestinal: Negative for abdominal pain, constipation, diarrhea, nausea and vomiting.       Abdominal distention  Genitourinary: Negative for dysuria and urgency.  Musculoskeletal: Negative for myalgias.  Neurological: Negative for dizziness, sensory change, speech change, focal weakness, seizures and headaches.  Psychiatric/Behavioral: Negative for depression.    DRUG ALLERGIES:   Allergies  Allergen Reactions  . Demerol [Meperidine] Nausea And Vomiting  . Reglan [Metoclopramide] Swelling  . Penicillins Swelling and Rash    Swelling to arm at injection site. Has patient had a PCN reaction causing immediate rash, facial/tongue/throat  swelling, SOB or lightheadedness with hypotension: Yes Has patient had a PCN reaction causing severe rash involving mucus membranes or skin necrosis: No Has patient had a PCN reaction that required hospitalization No Has patient had a PCN reaction occurring within the last 10 years: No If all of the above answers are "NO", then may proceed with Cephalosporin use.     VITALS:  Blood pressure (!) 155/65, pulse 78, temperature 98 F (36.7 C), temperature source Oral, resp. rate 19, height 5\' 7"  (1.702 m), weight (!) 138.5 kg (305 lb 6.4 oz), SpO2 99 %.  PHYSICAL EXAMINATION:  Physical Exam  GENERAL:  80 y.o.-year-old patient lying in the bed in mild  distress, somewhat uncomfortable, disheveled.  EYES: Pupils equal, round, reactive to light and accommodation. No scleral icterus. Extraocular muscles intact.  HEENT: Head atraumatic, normocephalic. Oropharynx and nasopharynx clear. NG tube in place with continuous suction NECK:  Supple, no jugular venous distention. No thyroid enlargement, no tenderness.  LUNGS: diminished breath sounds bilaterally at bases, no wheezing, rales,rhonchi or crepitation. No use of accessory muscles of respiration. Decreased bibasilar breath sounds CARDIOVASCULAR: S1, S1, irregular irregular, better rate control. No murmurs, rubs, or gallops.  ABDOMEN: Soft, obese, distended, non tender. Hypoactive Bowel sounds present. No organomegaly or mass. No guarding EXTREMITIES: Trace pedal edema, no cyanosis, or clubbing.  NEUROLOGIC: Cranial nerves II through XII are intact. Muscle strength 5/5 in all extremities. Sensation intact. Gait not checked.  PSYCHIATRIC: The patient is alert and oriented x 3.  SKIN: No obvious rash, lesion, or ulcer.    LABORATORY PANEL:   CBC  Recent Labs Lab 03/03/2016 0430  WBC 8.7  HGB 10.7*  HCT 33.3*  PLT 222    ------------------------------------------------------------------------------------------------------------------  Chemistries   Recent Labs Lab 02/19/16 0429  03-Mar-2016 0430  NA 142  < > 141  K 3.1*  < > 4.6  CL 103  < > 109  CO2 33*  < > 28  GLUCOSE 181*  < > 143*  BUN 51*  < > 27*  CREATININE 1.05*  < > 0.88  CALCIUM 7.7*  < > 6.5*  MG 2.1  --   --   < > = values in this interval not displayed. ------------------------------------------------------------------------------------------------------------------  Cardiac Enzymes No results for input(s): TROPONINI in the last 168 hours. ------------------------------------------------------------------------------------------------------------------  RADIOLOGY:  Ct Abdomen Pelvis Wo Contrast  Result Date: 03/03/2016 CLINICAL DATA:  80 year old female with small bowel obstruction, not improved in recent days. Initial encounter. EXAM: CT ABDOMEN AND PELVIS WITHOUT CONTRAST TECHNIQUE: Multidetector CT imaging of the abdomen and pelvis was performed following the standard protocol without IV contrast. COMPARISON:  CT Abdomen and Pelvis 02/21/2016 and earlier. FINDINGS: Lower chest: Small layering bilateral pleural effusions, greater on the right but mildly increased on the left. Stable cardiomegaly. No pericardial effusion. Compressive lower lobe atelectasis with no definite consolidation. No upper abdominal free air or free fluid. Hepatobiliary: some vicarious excretion of contrast to the gallbladder. Negative noncontrast liver. Pancreas: Negative noncontrast pancreas. 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 Adrenals/Urinary Tract: Small volume of residual excreted contrast in normal appearing renal collecting system and ureters. Excreted contrast in the urinary bladder. Punctate gas within the urinary bladder. Negative adrenal glands. Stomach/Bowel: Rectum remains decompressed.  Redundant sigmoid colon is largely decompressed. Proximal sigmoid and descending colon diverticulosis continuing into the transverse colon. The left colon and transverse colon are largely decompressed. In the right colon near the hepatic flexure there is a prominent gas containing diverticulum with surrounding inflammatory stranding (series 2, image 43). The right colon also appears somewhat inflamed in general, somewhat different from the remaining large bowel segments. There is trace fluid in the right gutter. Proximal to this the cecum and appendix are stable and grossly normal. The terminal ileum and distal small bowel loops are nondilated. There is a gradual transition from dilated small bowel to the distal decompressed small bowel which can be seen in the right lower abdomen on series 2, image 67. There is an NG tube terminating in the distal stomach. The duodenum is mildly distended. Proximal small bowel loops quickly dilate from the level of the terminal ileum up to a maximum of 54 mm with surrounding mesenteric stranding and trace fluid. No pneumoperitoneum. The bowel then takes the relatively gradual transition in the right lower abdomen described above. No pneumatosis identified. Vascular/Lymphatic: Aortoiliac calcified atherosclerosis noted. On close inspection of yesterday contrasted enhanced scan I believe there is thrombus within the SMA (series 2, image 37 yesterday). No portal venous gas. Reproductive: Surgically absent uterus. Diminutive or absent adnexa. Other: No pelvic free fluid, but there is a small volume of lower abdominal free fluid in the mesenteric. Musculoskeletal: Stable visualized osseous structures. IMPRESSION: 1. Dilated and inflamed small bowel loops throughout much of the abdomen without improvement since yesterday. There is a gradual transition which argues against mechanical small bowel obstruction. Further, the degree of mesenteric inflammation is atypical. Instead I strongly  suspect this is Acute Small Bowel Ischemia rather than mechanical obstruction. Furthermore, the ischemia might also be affecting the right colon. 2. The above critical  Value/emergent result were called by telephone at the time of interpretation on 03/13/2016 at 9:18 am to Dr. Katharina Caper, who verbally acknowledged these results. 3. Small volume abdominal and lower mesenteric free fluid. No pneumoperitoneum, pneumatosis or portal venous gas. 4. Small layering pleural effusions, mildly increased on the left since yesterday. 5. NG tube tip at the distal stomach. 6. Trace gas in the urinary bladder. Consider UTI if not explained by recent bladder catheterization. Electronically Signed   By: Odessa Fleming M.D.   On: 03/15/2016 09:21   Dg Chest 1 View  Result Date: 02/20/2016 CLINICAL DATA:  PICC. EXAM: CHEST 1 VIEW COMPARISON:  Four days ago FINDINGS: New left upper extremity PICC with tip at the upper cavoatrial junction. Nasogastric tube at least reaches the stomach. Chronic cardiomegaly. There is no edema, consolidation, effusion, or pneumothorax. IMPRESSION: Left upper extremity PICC with tip at the upper cavoatrial junction. Electronically Signed   By: Marnee Spring M.D.   On: 02/20/2016 14:40   Dg Abd 1 View  Result Date: 02/21/2016 CLINICAL DATA:  80 year old female with history of abdominal pain. Ileus. Weakness. EXAM: ABDOMEN - 1 VIEW COMPARISON:  Abdominal radiograph 02/20/2016. FINDINGS: Nasogastric tube with tip in the mid body of the stomach. Stomach is decompressed. Marked dilatation of small bowel loops measuring up to 6.6 cm in the left upper quadrant of the abdomen. Paucity of distal colonic gas, although some gas is noted in the colon. No gross evidence of pneumoperitoneum. IMPRESSION: 1. The appearance of the abdomen is again suggestive of partial small bowel obstruction, which appears to a worsening compared to prior study 02/20/2016. 2. Tip of nasogastric tube is in the mid body of the stomach.  Electronically Signed   By: Trudie Reed M.D.   On: 02/21/2016 09:09   Ct Abdomen Pelvis W Contrast  Result Date: 02/21/2016 CLINICAL DATA:  Evaluate small bowel obstruction. EXAM: CT ABDOMEN AND PELVIS WITH CONTRAST TECHNIQUE: Multidetector CT imaging of the abdomen and pelvis was performed using the standard protocol following bolus administration of intravenous contrast. CONTRAST:  ISOVUE-300 IOPAMIDOL (ISOVUE-300) INJECTION 61% COMPARISON:  KUB from February 21, 2016 and CT scan from February 12, 2016 FINDINGS: Lower chest: Small bilateral effusions and underlying atelectasis are identified. Lung bases are otherwise unchanged an unremarkable. Hepatobiliary: The liver and portal vein are within normal limits. The gallbladder is mildly distended but otherwise unremarkable. Pancreas: Unremarkable. No pancreatic ductal dilatation or surrounding inflammatory changes. Spleen: Normal in size without focal abnormality. Adrenals/Urinary Tract: Adrenal glands are unremarkable. Kidneys are normal, without renal calculi, focal lesion, or hydronephrosis. Bladder is unremarkable. Stomach/Bowel: An NG tube terminates in the distal stomach. There is a small bowel obstruction with a transition point in the right side of the abdomen, likely the mid ileum, best seen on coronal images 39 through 42. Colonic diverticulosis is seen without diverticulitis. The colon is relatively decompressed. Limited views of the appendix are normal with no appendicitis. Vascular/Lymphatic: Atherosclerosis is seen in the non aneurysmal aorta and iliac vessels. No adenopathy. Reproductive: The patient is status post hysterectomy. No pelvic masses. Other: Air in the bladder is likely due to recent catheterization. Ascites in the mesenteries likely secondary to the bowel obstruction. Musculoskeletal: Anterior wedging of T12 is unchanged. No acute bony abnormalities. IMPRESSION: 1. Small bowel obstruction. The transition point appears to  be in the mid ileum. 2. Small bilateral effusions and underlying atelectasis. 3. Atherosclerosis in the abdominal aorta and iliac vessels. Electronically Signed   By: Onalee Hua  Judithe Modest M.D   On: 02/21/2016 15:19   Dg Abd Acute W/chest  Result Date: 02/21/2016 CLINICAL DATA:  Small-bowel obstruction, admitted 02-15-16. No bowel movement today. EXAM: DG ABDOMEN ACUTE W/ 1V CHEST COMPARISON:  Abdominal x-ray and CT abdomen dated 02/21/2016. Chest x-rays dated 02/20/2016 and 02/16/2016. FINDINGS: Single view of the chest: Mild cardiomegaly is stable. Overall cardiomediastinal silhouette appears stable in size and configuration. Left-sided PICC line is adequately positioned with tip at the level of the mid SVC. Lungs are clear. Upright and supine views of the abdomen: Again noted are diffusely dilated gas-filled loops of small bowel throughout the abdomen an upper pelvis, stable compared to plain film from earlier today, not significantly changed compared to the earlier plain film of 02/16/2016. No evidence of free intraperitoneal air. IMPRESSION: 1. Continued evidence of small bowel obstruction, not significantly changed compared to the plain film from earlier today, also not significantly changed compared to the earlier plain film of 02/16/2016. Transition point within the mid ileum, per today's earlier CT report. 2. Lungs are clear and there is no evidence of acute cardiopulmonary abnormality. Electronically Signed   By: Bary Richard M.D.   On: 02/21/2016 18:17    EKG:   Orders placed or performed during the hospital encounter of 15-Feb-2016  . EKG 12-Lead  . EKG 12-Lead    ASSESSMENT AND PLAN:   BobbieMilburnis a 80 y.o.femalewho presents with Acute onset abdominal pain with coffee-ground emesis and hematochezia, also had hyperglycemia, afib rvr and mental status changes  #1 Acute GI bleed - likely upper and lower both- gastritis and diverticulitis  - stopped now, hb stable. 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 Continue  Dulcolax suppositories, unfortunately, minimal rectal output. Follow lactic acid level. Patient is a poor candidate for thrombolytics due to recent acute GI bleed, she is initiated on heparin IV drip due to concerns of SMA occlusion, high risk of GI bleed recurrence. Initiated TPN.   #3 .Atrial fibrillation with RVR (HCC), now off oral Cardizem and metoprolol, continue patient on Cardizem IV when necessary, metoprolol IV around-the-clock, continue amiodarone IV drip , digoxin if needed , appreciate cardiology consult, echocardiogram done on this admission was normal, except mild valvular abnormalities -Patient was initially managed on no anticoagulation due to GI bleed (pt was on eliquis in the past-she stopped it several months ago), however, due to concerns of SMA occlusion, she is initiated on heparin IV drip at present  #4 UncontrolledDiabetes (HCC)  Due to medication non compliance on admission -NPO currently, started lantus BID as sugars elevated - a1c 11.9, on SSI, blood glucose levels are ranging between 100-200, continue insulin Lantus at 16 units twice a day, may need to be advanced due to initiation of parenteral nutrition  #5 ARF- ATN from sepsis,, creatinine has improved on  IV fluids, continue low rate IV fluids, parenteral nutrition, following kidney function , urinary output closely. kidneys on the CT abd without any acute findings - avoid nephrotoxins, monitor  #6 Hypokalemia- replaced. Magnesium level normal  #6.  DVT Prophylaxis- TEDs and SCDs Intravenous heparin    #  7. Generalized weakness, Physical Therapy consulted,  SNF at discharge Discharge plans once small bowel obstruction resolves, hopefully next week    All the records are reviewed and case discussed with Care Management/Social Workerr. Management plans discussed with the patient, family and they are in agreement.  CODE STATUS: Full Code  TOTAL TIME TAKING CARE OF THIS PATIENT:  45 minutes.   discussed with general and vascular surgery    POSSIBLE D/C IN 3 DAYS, DEPENDING ON CLINICAL CONDITION.   Katharina Caper M.D on 02/26/2016 at 12:02 PM  Between 7am to 6pm - Pager - [phone]  After 6pm go to [email or web address] - Social research officer, government  [facility]  Office  [phone]  CC: Primary care physician; No PCP Per Patient

## 2016-02-22 NOTE — Progress Notes (Signed)
PT Cancellation Note  Patient Details Name: [name] MRN: [id number] DOB: 01/03/1936   Cancelled Treatment:    Reason Eval/Treat Not Completed: Patient at procedure or test/unavailable Pt out of room, down for angio - will try back tomorrow as appropriate.   Malachi ProGalen R Amarys Sliwinski 03/02/2016, 2:56 PM

## 2016-02-22 NOTE — Progress Notes (Addendum)
PARENTERAL NUTRITION CONSULT NOTE - INITIAL  Pharmacy Consult for TPN  Indication: SBO  Assessment: Pharmacy consulted to initiate TPN for 80 yo female with SBO.   Plan:  Will start patient on Clinimix E 5/15 at 66mL/hr with MVI and trace elements added. Will obtain electrolytes and triglycerides with am labs. Will change SSI to q4hr. Will plan to advance to goal on 10/2.     Allergies  Allergen Reactions  . Demerol [Meperidine] Nausea And Vomiting  . Reglan [Metoclopramide] Swelling  . Penicillins Swelling and Rash    Swelling to arm at injection site. Has patient had a PCN reaction causing immediate rash, facial/tongue/throat swelling, SOB or lightheadedness with hypotension: Yes Has patient had a PCN reaction causing severe rash involving mucus membranes or skin necrosis: No Has patient had a PCN reaction that required hospitalization No Has patient had a PCN reaction occurring within the last 10 years: No If all of the above answers are "NO", then may proceed with Cephalosporin use.     Patient Measurements: Height: 5\' 7"  (170.2 cm) Weight: (!) 305 lb 6.4 oz (138.5 kg) IBW/kg (Calculated) : 61.6 Adjusted Body Weight: 92.4kg   Vital Signs: Temp: 98 F (36.7 C) (10/01 1117) Temp Source: Oral (10/01 1117) BP: 155/65 (10/01 1117) Pulse Rate: 78 (10/01 1117) Intake/Output from previous day: 09/30 0701 - 10/01 0700 In: 2142.2 [I.V.:1692.2; NG/GT:450] Out: 950 [Urine:550; Emesis/NG output:400] Intake/Output from this shift: No intake/output data recorded.  Labs:  Recent Labs  02/21/16 0444 02/21/16 1155 2016-03-11 0430  WBC 9.7  --  8.7  HGB 11.0*  --  10.7*  HCT 34.2*  --  33.3*  PLT 209  --  222  APTT  --  29  --   INR  --  1.18  --      Recent Labs  02/20/16 0444 02/21/16 0444 11-Mar-2016 0430 March 11, 2016 1221  NA 144 139 141  --   K 3.5 3.2* 4.6  --   CL 105 99* 109  --   CO2 29 30 28   --   GLUCOSE 189* 418* 143*  --   BUN 40* 36* 27*  --   CREATININE  0.95 0.97 0.88  --   CALCIUM 7.8* 6.8* 6.5*  --   PROT  --   --   --  5.3*  ALBUMIN  --   --   --  1.5*  AST  --   --   --  12*  ALT  --   --   --  8*  ALKPHOS  --   --   --  49  BILITOT  --   --   --  0.8  BILIDIR  --   --   --  0.2  IBILI  --   --   --  0.6   Estimated Creatinine Clearance: 76.9 mL/min (by C-G formula based on SCr of 0.88 mg/dL).    Recent Labs  03/11/2016 0612 03-11-16 0720 03-11-16 1118  GLUCAP 105* 108* 103*    Medical History: Past Medical History:  Diagnosis Date  . Atrial fibrillation (HCC)   . Diabetes mellitus without complication (HCC)   . Hypertension     Insulin Requirements in the past 24 hours:  3 units + 32 units of Lantus (16 units BID)  Current Nutrition:  Clinimix E 5/15 @ (goal 45mL/hr) and Fat emulsion 20% @ 26mL/hr.    Nutritional Goals:  1912 kCal, 84 grams of protein per day at goal  Pharmacy will continue to monitor and adjust per consult.   Simpson,Michael L 03/08/2016,2:01 PM

## 2016-02-22 NOTE — Progress Notes (Signed)
  Serial exam performed  Patient denies any abdominal pain at this time.  Abdomen is mildly distended but nontender.  Surgery will continue to follow closely.  Ricarda Frameharles Judithann Villamar, MD [facility] General Surgeon [facility]

## 2016-02-22 NOTE — Progress Notes (Signed)
Down for angio

## 2016-02-22 NOTE — Progress Notes (Signed)
No results from SSE. Pt was unable to retain any of the fluid, was the clear fluid infused. Will continue to monitor pt.

## 2016-02-22 NOTE — Progress Notes (Signed)
ANTICOAGULATION CONSULT NOTE - Initial Consult  Pharmacy Consult for heparin drip  Indication: ?SMA Occlusion   Assessment: Pharmacy consulted to initiate heparin drip for 80 yo female being treated for possible SMA occulusion. Patient admitted with GI bleed. Patient with Afib not on chronic anticoagulation due to GI bleeds. Patient previously on heparin SQ, last dose 0515.    Goal of Therapy:  Heparin level 0.3-0.7 units/ml Monitor platelets by anticoagulation protocol: Yes   Plan:  Will not bolus will start patient on heparin 1350 units/hr. Will obtain anti-Xa level 8 hours after initiation.     Allergies  Allergen Reactions  . Demerol [Meperidine] Nausea And Vomiting  . Reglan [Metoclopramide] Swelling  . Penicillins Swelling and Rash    Swelling to arm at injection site. Has patient had a PCN reaction causing immediate rash, facial/tongue/throat swelling, SOB or lightheadedness with hypotension: Yes Has patient had a PCN reaction causing severe rash involving mucus membranes or skin necrosis: No Has patient had a PCN reaction that required hospitalization No Has patient had a PCN reaction occurring within the last 10 years: No If all of the above answers are "NO", then may proceed with Cephalosporin use.     Patient Measurements: Height: 5\' 7"  (170.2 cm) Weight: (!) 305 lb 6.4 oz (138.5 kg) IBW/kg (Calculated) : 61.6 Heparin Dosing Weight: 95.5kg    Vital Signs: Temp: 98 F (36.7 C) (10/01 1117) Temp Source: Oral (10/01 1117) BP: 155/65 (10/01 1117) Pulse Rate: 78 (10/01 1117)  Labs:  Recent Labs  02/20/16 0444 02/21/16 0444 02/21/16 1155 03/21/2016 0430  HGB  --  11.0*  --  10.7*  HCT  --  34.2*  --  33.3*  PLT  --  209  --  222  APTT  --   --  29  --   LABPROT  --   --  15.1  --   INR  --   --  1.18  --   CREATININE 0.95 0.97  --  0.88    Estimated Creatinine Clearance: 76.9 mL/min (by C-G formula based on SCr of 0.88 mg/dL).   Medical History: Past  Medical History:  Diagnosis Date  . Atrial fibrillation (HCC)   . Diabetes mellitus without complication (HCC)   . Hypertension    Pharmacy will continue to monitor and adjust per consult.   Malachai Schalk L 02/26/2016,1:53 PM

## 2016-02-22 NOTE — Progress Notes (Signed)
CC: sbo Subjective: Pt seen and examined , CT scan personally reviewed and d/w Dr. Margo Beck From radiology.  HE looked in a very attentive fashion at the CT scan from yesterday and compared it to the one this am. 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 No evidence of mechanical bowel obstruction. I have D/W Dr. Gilda Beck and he will come and evaluate her. I also saw on her CT scan now that she does have gas in her transverse colon, ( change from yesterday)   Objective: Vital signs in last 24 hours: Temp:  [97.4 F (36.3 C)-97.8 F (36.6 C)] 97.8 F (36.6 C) (10/01 0506) Pulse Rate:  [77-125] 114 (10/01 0615) Resp:  [16-18] 18 (10/01 0506) BP: (104-154)/(62-113) 138/77 (10/01 0615) SpO2:  [92 %-97 %] 92 % (10/01 0506) Last BM Date: 02/20/16  Intake/Output from previous day: 09/30 0701 - 10/01 0700 In: 2142.2 [I.V.:1692.2; NG/GT:450] Out: 950 [Urine:550; Emesis/NG output:400] Intake/Output this shift: No intake/output data recorded.  Physical exam: NAD, morbidly obese on A fib Chest: Decrease bs bilaterally, A fib Abd: soft, obese, non tender, no peritoneal signs Ext: well perfused  Lab Results: CBC   Recent Labs  02/21/16 0444 02/27/2016 0430  WBC 9.7 8.7  HGB 11.0* 10.7*  HCT 34.2* 33.3*  PLT 209 222   BMET  Recent Labs  02/21/16 0444 02/25/2016 0430  NA 139 141  K 3.2* 4.6  CL 99* 109  CO2 30 28  GLUCOSE 418* 143*  BUN 36* 27*  CREATININE 0.97 0.88  CALCIUM 6.8* 6.5*   PT/INR  Recent Labs  02/21/16 1155  LABPROT 15.1  INR 1.18   ABG No results for input(s): PHART, HCO3 in the last 72 hours.  Invalid input(s): PCO2, PO2  Studies/Results: Ct Abdomen Pelvis Wo Contrast  Result Date: 03/22/2016 CLINICAL DATA:  80 year old female with small bowel obstruction, not improved in recent days. Initial encounter. EXAM: CT ABDOMEN AND PELVIS WITHOUT CONTRAST TECHNIQUE:  Multidetector CT imaging of the abdomen and pelvis was performed following the standard protocol without IV contrast. COMPARISON:  CT Abdomen and Pelvis 02/21/2016 and earlier. FINDINGS: Lower chest: Small layering bilateral pleural effusions, greater on the right but mildly increased on the left. Stable cardiomegaly. No pericardial effusion. Compressive lower lobe atelectasis with no definite consolidation. No upper abdominal free air or free fluid. Hepatobiliary: some vicarious excretion of contrast to the gallbladder. Negative noncontrast liver. Pancreas: Negative noncontrast pancreas. 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 Adrenals/Urinary Tract: Small volume of residual excreted contrast in normal appearing renal collecting system and ureters. Excreted contrast in the urinary bladder. Punctate gas within the urinary bladder. Negative adrenal glands. Stomach/Bowel: Rectum remains decompressed. Redundant sigmoid colon is largely decompressed. Proximal sigmoid and descending colon diverticulosis continuing into the transverse colon. The left colon and transverse colon are largely decompressed. In the right colon near the hepatic flexure there is a prominent gas containing diverticulum with surrounding inflammatory stranding (series 2, image 43). The right colon also appears somewhat inflamed in general, somewhat different from the remaining large bowel segments. There is trace fluid in the right gutter. Proximal to this the cecum and appendix are stable and grossly normal. The terminal ileum and distal small bowel loops are nondilated. There is a gradual transition from dilated small bowel to the distal decompressed small bowel which can be seen in the  right lower abdomen on series 2, image 67. There is an NG tube terminating in the distal stomach. The duodenum is mildly distended. Proximal small bowel loops quickly dilate from the level of the  terminal ileum up to a maximum of 54 mm with surrounding mesenteric stranding and trace fluid. No pneumoperitoneum. The bowel then takes the relatively gradual transition in the right lower abdomen described above. No pneumatosis identified. Vascular/Lymphatic: Aortoiliac calcified atherosclerosis noted. On close inspection of yesterday contrasted enhanced scan I believe there is thrombus within the SMA (series 2, image 37 yesterday). No portal venous gas. Reproductive: Surgically absent uterus. Diminutive or absent adnexa. Other: No pelvic free fluid, but there is a small volume of lower abdominal free fluid in the mesenteric. Musculoskeletal: Stable visualized osseous structures. IMPRESSION: 1. Dilated and inflamed small bowel loops throughout much of the abdomen without improvement since yesterday. There is a gradual transition which argues against mechanical small bowel obstruction. Further, the degree of mesenteric inflammation is atypical. Instead I strongly suspect this is Acute Small Bowel Ischemia rather than mechanical obstruction. Furthermore, the ischemia might also be affecting the right colon. 2. The above critical Value/emergent result were called by telephone at the time of interpretation on 03/06/2016 at 9:18 am to Dr. Katharina Beck, who verbally acknowledged these results. 3. Small volume abdominal and lower mesenteric free fluid. No pneumoperitoneum, pneumatosis or portal venous gas. 4. Small layering pleural effusions, mildly increased on the left since yesterday. 5. NG tube tip at the distal stomach. 6. Trace gas in the urinary bladder. Consider UTI if not explained by recent bladder catheterization. Electronically Signed   By: [name] M.D.   On: 02/26/2016 09:21   Dg Chest 1 View  Result Date: 02/20/2016 CLINICAL DATA:  PICC. EXAM: CHEST 1 VIEW COMPARISON:  Four days ago FINDINGS: New left upper extremity PICC with tip at the upper cavoatrial junction. Nasogastric tube at least reaches the  stomach. Chronic cardiomegaly. There is no edema, consolidation, effusion, or pneumothorax. IMPRESSION: Left upper extremity PICC with tip at the upper cavoatrial junction. Electronically Signed   By: Marnee Spring M.D.   On: 02/20/2016 14:40   Dg Abd 1 View  Result Date: 02/21/2016 CLINICAL DATA:  80 year old female with history of abdominal pain. Ileus. Weakness. EXAM: ABDOMEN - 1 VIEW COMPARISON:  Abdominal radiograph 02/20/2016. FINDINGS: Nasogastric tube with tip in the mid body of the stomach. Stomach is decompressed. Marked dilatation of small bowel loops measuring up to 6.6 cm in the left upper quadrant of the abdomen. Paucity of distal colonic gas, although some gas is noted in the colon. No gross evidence of pneumoperitoneum. IMPRESSION: 1. The appearance of the abdomen is again suggestive of partial small bowel obstruction, which appears to a worsening compared to prior study 02/20/2016. 2. Tip of nasogastric tube is in the mid body of the stomach. Electronically Signed   By: Trudie Reed M.D.   On: 02/21/2016 09:09   Ct Abdomen Pelvis W Contrast  Result Date: 02/21/2016 CLINICAL DATA:  Evaluate small bowel obstruction. EXAM: CT ABDOMEN AND PELVIS WITH CONTRAST TECHNIQUE: Multidetector CT imaging of the abdomen and pelvis was performed using the standard protocol following bolus administration of intravenous contrast. CONTRAST:  ISOVUE-300 IOPAMIDOL (ISOVUE-300) INJECTION 61% COMPARISON:  KUB from February 21, 2016 and CT scan from February 12, 2016 FINDINGS: Lower chest: Small bilateral effusions and underlying atelectasis are identified. Lung bases are otherwise unchanged an unremarkable. Hepatobiliary: The liver and portal vein are within normal  limits. The gallbladder is mildly distended but otherwise unremarkable. Pancreas: Unremarkable. No pancreatic ductal dilatation or surrounding inflammatory changes. Spleen: Normal in size without focal abnormality. Adrenals/Urinary Tract:  Adrenal glands are unremarkable. Kidneys are normal, without renal calculi, focal lesion, or hydronephrosis. Bladder is unremarkable. Stomach/Bowel: An NG tube terminates in the distal stomach. There is a small bowel obstruction with a transition point in the right side of the abdomen, likely the mid ileum, best seen on coronal images 39 through 42. Colonic diverticulosis is seen without diverticulitis. The colon is relatively decompressed. Limited views of the appendix are normal with no appendicitis. Vascular/Lymphatic: Atherosclerosis is seen in the non aneurysmal aorta and iliac vessels. No adenopathy. Reproductive: The patient is status post hysterectomy. No pelvic masses. Other: Air in the bladder is likely due to recent catheterization. Ascites in the mesenteries likely secondary to the bowel obstruction. Musculoskeletal: Anterior wedging of T12 is unchanged. No acute bony abnormalities. IMPRESSION: 1. Small bowel obstruction. The transition point appears to be in the mid ileum. 2. Small bilateral effusions and underlying atelectasis. 3. Atherosclerosis in the abdominal aorta and iliac vessels. Electronically Signed   By: Gerome Sam III M.D   On: 02/21/2016 15:19   Dg Abd Acute W/chest  Result Date: 02/21/2016 CLINICAL DATA:  Small-bowel obstruction, admitted 02/09/2016. No bowel movement today. EXAM: DG ABDOMEN ACUTE W/ 1V CHEST COMPARISON:  Abdominal x-ray and CT abdomen dated 02/21/2016. Chest x-rays dated 02/20/2016 and 02/16/2016. FINDINGS: Single view of the chest: Mild cardiomegaly is stable. Overall cardiomediastinal silhouette appears stable in size and configuration. Left-sided PICC line is adequately positioned with tip at the level of the mid SVC. Lungs are clear. Upright and supine views of the abdomen: Again noted are diffusely dilated gas-filled loops of small bowel throughout the abdomen an upper pelvis, stable compared to plain film from earlier today, not significantly changed  compared to the earlier plain film of 02/16/2016. No evidence of free intraperitoneal air. IMPRESSION: 1. Continued evidence of small bowel obstruction, not significantly changed compared to the plain film from earlier today, also not significantly changed compared to the earlier plain film of 02/16/2016. Transition point within the mid ileum, per today's earlier CT report. 2. Lungs are clear and there is no evidence of acute cardiopulmonary abnormality. Electronically Signed   By: Bary Richard M.D.   On: 02/21/2016 18:17    Anti-infectives: Anti-infectives    Start     Dose/Rate Route Frequency Ordered Stop   02/28/2016 1100  ciprofloxacin (CIPRO) IVPB 400 mg     400 mg 200 mL/hr over 60 Minutes Intravenous 2 times daily 02/28/2016 0926     02/26/2016 1100  metroNIDAZOLE (FLAGYL) IVPB 500 mg     500 mg 100 mL/hr over 60 Minutes Intravenous Every 8 hours 03/02/2016 0926     03/01/2016 0600  metroNIDAZOLE (FLAGYL) IVPB 500 mg  Status:  Discontinued     500 mg 100 mL/hr over 60 Minutes Intravenous On call to O.R. 03/19/2016 0109 03/02/2016 0925   03/20/2016 0600  ciprofloxacin (CIPRO) IVPB 400 mg  Status:  Discontinued     400 mg 200 mL/hr over 60 Minutes Intravenous On call to O.R. 03/07/2016 0109 03/18/2016 0925   02/16/16 2100  vancomycin (VANCOCIN) IVPB 1000 mg/200 mL premix  Status:  Discontinued     1,000 mg 200 mL/hr over 60 Minutes Intravenous Every 36 hours 02/15/16 1053 02/16/16 1123   02/15/16 0800  vancomycin (VANCOCIN) 1,500 mg in sodium chloride 0.9 % 500 mL  IVPB  Status:  Discontinued     1,500 mg 250 mL/hr over 120 Minutes Intravenous Every 24 hours 02/14/16 1846 02/15/16 1053   02/14/16 1900  vancomycin (VANCOCIN) 1,500 mg in sodium chloride 0.9 % 500 mL IVPB     1,500 mg 250 mL/hr over 120 Minutes Intravenous  Once 02/14/16 1843 02/14/16 2255   02/14/16 1900  piperacillin-tazobactam (ZOSYN) IVPB 3.375 g  Status:  Discontinued     3.375 g 12.5 mL/hr over 240 Minutes Intravenous Every 8 hours  02/14/16 1843 02/16/16 1144   01/24/2016 2100  cefTRIAXone (ROCEPHIN) 1 g in dextrose 5 % 50 mL IVPB     1 g 100 mL/hr over 30 Minutes Intravenous  Once 02/01/2016 2100 02/17/2016 2217      Assessment/Plan: SMA occlusion, 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 She recently had a GI bleed and her anticoagulation was stopped. She is between and rock and a hard place. We will await for vascular recs. NO evidence of mechanical bowel obstruction or necrosis of the bowel that warrant laparotomy at this time. D/W the pt in detail as well as with Dr. Winona LegatoVaickute. I will start her on cipro and flayl, continue TPN and NGT. We will continue to follow. I spent at least 35 minutes in this encounter with the majority of time spent coordinating her care and counseling the pt.   Sterling Bigiego Pabon, MD, [facility] Center[facility]  03/07/2016

## 2016-02-22 NOTE — OR Nursing (Signed)
Nauseated, Dr Gilda CreaseSchnier notified, zofran 4 mg iv

## 2016-02-22 NOTE — Progress Notes (Signed)
Called pharmacy (spoke with Misty StanleyLisa - pharmacist) to verify compatabilities for TPN, lipids, flagyl, Na+20k, heparin, cipro and amio. Compatabilities are TPN/lipids, TPN/flagyl hold lipids. Na+20k/heparin, cipro/amio.

## 2016-02-22 NOTE — Progress Notes (Signed)
Surgical prep completed. Consent signed. Complaints of pain x 1. Medicated. Pt slept well throughout the night. Continue on amiodarone drip . Ngt still in place. Continues to be npo  Sonia SideMichelle Greenly Rarick Rn

## 2016-02-22 NOTE — Op Note (Signed)
[facility] Percutaneous Study/Intervention Procedural Note   Date of Surgery: 02/09/2016 - 03/01/2016  Surgeon(s): Hortencia Pilar   Assistants:None  Pre-operative Diagnosis: Embolization of the SMA; atrial fibrillation Post-operative diagnosis: Same  Procedure(s) Performed: 1. Ultrasound guidance for vascular access right femoral artery 2. Catheter placement into SMA from right femoral approach 3. Aortogram and selective SMA angiogram 4. Mechanical thrombectomy with the AngioJet Omni 120  5. Percutaneous transluminal angioplasty SMA with a 5 x 6 Lutonix balloon             6.  StarClose closure device right femoral artery  Anesthesia: local with moderate conscious sedation for approximately 1 hour 20 minutes using intravenous Versed and  Fentanyl  Fluoro Time: 10.5 minutes  Contrast: 130 cc  Indications: Patient is a 80 year old woman who presented to the hospital with abrupt onset of abdominal pain and was found to be in atrial fibrillation with poor ventricular control..  Angiogram is performed to evaluate the lesion more thoroughly and potentially allow treatment. Risks and benefits were discussed and informed consent was obtained  Procedure: The patient was identified and appropriate procedural time out was performed. The patient was then placed supine on the table and prepped and draped in the usual sterile fashion.Moderate conscious sedation was administered during a face to face encounter with the patient with the RN monitoring their vital signs, mental status, telemetry and pulse oximetry throughout the procedure.  Ultrasound was used to evaluate the right common femoral artery. It was patent . A digital ultrasound image was acquired. A micropuncture needle was used to access the right common femoral artery under direct ultrasound guidance and a microwire followed by  micro-sheath was then inserted. A 0.035 J wire was advanced without resistance and a 5Fr sheath was placed. Pigtail catheter was then placed into the aorta and initially an AP aortogram was performed which showed the SMA filled poorly in the AP projection. Lateral projection view was performed which showed typical orientations of the celiac and SMA.    4000 Units of heparin was then administered and the LAD to circulate for several minutes  The celiac was then selected with a VS 1 catheter and selective injection of the celiac was performed no evidence of distal emboli or other obstructive lesions are noted in the celiac and attention was then turned to the SMA.  The SMA was then selected with a VS 1 catheter. Selective imaging of the superior mesenteric artery demonstrated  occlusion of the SMA approximately 4-6 cm distal to the origin. There is reconstitution of the SMA at the level of a large bifurcation distally. Overall the lesion appears to be approximate 5 cm in length.  A 6 French Ansel Hyflex sheath was then inserted and positioned with its tip several centimeters into the SMA in order to perform the intervention.  The lesion was crossed without difficulty with a Glidewire and then exchanged for a 0.35 Magic torque wire.  The AngioJet Omni 120 catheter was then prepped on the field and advanced through the sheath mechanical thrombectomy was then performed across the occlusion in the SMA. Multiple passes were made. Follow-up angiogram demonstrated only modest improvement.   I then selected a 3 mm diameter by 4 mm length balloon. The balloon was inflated to 12 atm. Follow-up imaging demonstrated a marked improvement but the area was still undersized and therefore a 4 x 6 Lutonix and ultimately after follow-up imaging a 5 I 6 Lutonix was utilized to treat this occluded  segment. Both inflations with Lutonix balloons were to 10 atm for 2 minutes. Patency was restored with a 20% residual stenosis seen  on completion angiogram. Given such a marked improvement I did not feel that stenting in the mid to distal SMA was appropriate  I elected to terminate the procedure. The guide catheter was removed and oblique arteriogram was performed of the right femoral artery. StarClose closure device was deployed in the usual fashion with excellent hemostatic result.  Findings:  Aortogram: Widely patent with normal anatomy SMA: Widely patent in its origin. There is an occlusion proximally 4-6 cm beyond or distal to the origin. There is reconstitution distally with a lesion that appears to be approximate 5 cm in length. Following angioplasty to 5 mm in diameter there is now restoration of flow throughout the SMA with excellent filling distally. Celiac is also interrogated and there is no evidence of distal emboli. Origin of the celiac is widely patent.  Disposition: Patient was taken to the recovery room in stable condition having tolerated the procedure well.   Hortencia Pilar 03/20/2016 3:32 PM

## 2016-02-22 NOTE — Consult Note (Signed)
[facility] Vascular Consult Note  MRN : [id number]  [name] is a 80 y.o. (25-Feb-1936) female who presents with chief complaint of  Chief Complaint  Patient presents with  . Diarrhea  . Abdominal Pain  . Emesis   History of Present Illness:  The patient is a 80 year old female with a PMHx of atrial fibrillation, DM and HTN admitted on 02/10/2016 through [facility] ED with a chief complaint of severe epigastric pain. She endorsed a history of a "few days" of diarrhea, abdominal pain and coffee-ground emesis as well as hematochezia associated with the pain. As the pain worsened she sought medical attention at our ED. She states she has a history of A. fib and diabetes but has not been taking her medications for the past several months.  Patient admitted with acute GI bleed and in Afib with RVR. Afib has been followed by cardiology. Seen by gastroenterology who ordered NPO status with NG tube for "ileus". Seen by general surgery on 02/21/16 for prolonged ileus - x-ray at time of his consult showed evidence of dilated loops of small bowel with also air within the colon suggestive of partial small bowel obstruction. New CT ordered at that time which showed dilation of SB w transition point c/w SBO, no free air, no pneumatosis or ischemic bowel. Repeat CT on the AM of 03-21-2016 to assess if any progression of SBO notable for possible SMA occlusion.  Vascular surgery consulted by primary team (Dr. Winona Legato) to assess to vascular intervention is warranted.   Current Facility-Administered Medications  Medication Dose Route Frequency Provider Last Rate Last Dose  . Marland KitchenTPN (CLINIMIX-E) Adult   Intravenous Continuous TPN Katharina Caper, MD      . 0.9 % NaCl with KCl 20 mEq/ L  infusion   Intravenous Continuous Katharina Caper, MD 75 mL/hr at 2016/03/21 0113    . acetaminophen (TYLENOL) tablet 650 mg  650 mg Oral Q6H PRN Enid Baas, MD      . amiodarone (NEXTERONE PREMIX) 360-4.14  MG/200ML-% (1.8 mg/mL) IV infusion  30 mg/hr Intravenous Continuous Katharina Caper, MD 16.7 mL/hr at 02/21/16 2217 30 mg/hr at 02/21/16 2217  . bisacodyl (DULCOLAX) suppository 10 mg  10 mg Rectal Daily Katharina Caper, MD   10 mg at 03-21-16 1057  . ciprofloxacin (CIPRO) IVPB 400 mg  400 mg Intravenous BID Leafy Ro, MD   400 mg at 2016/03/21 1056  . digoxin (LANOXIN) 0.25 MG/ML injection 0.125 mg  0.125 mg Intravenous Daily Katharina Caper, MD      . digoxin (LANOXIN) 0.25 MG/ML injection 0.5 mg  0.5 mg Intravenous Once Adrian Saran, MD      . diltiazem (CARDIZEM) injection 10 mg  10 mg Intravenous Q4H PRN Ihor Austin, MD   10 mg at 02/21/16 1833  . fat emulsion 20 % infusion 500 mL  500 mL Intravenous Continuous TPN Katharina Caper, MD      . insulin aspart (novoLOG) injection 0-15 Units  0-15 Units Subcutaneous Q4H Katharina Caper, MD      . insulin aspart (novoLOG) injection 0-9 Units  0-9 Units Subcutaneous Q6H Katharina Caper, MD   3 Units at 02/21/16 1704  . insulin glargine (LANTUS) injection 16 Units  16 Units Subcutaneous BID Katharina Caper, MD   16 Units at 03-21-2016 1056  . iopamidol (ISOVUE-300) 61 % injection 30 mL  30 mL Oral Once PRN Katharina Caper, MD      . metoprolol (LOPRESSOR) injection 5 mg  5 mg Intravenous Q6H Adrian SaranSital Mody, MD   5 mg at 28-Jul-2015 0515  . metroNIDAZOLE (FLAGYL) IVPB 500 mg  500 mg Intravenous Q8H Diego F Pabon, MD      . morphine 2 MG/ML injection 2 mg  2 mg Intravenous Q4H PRN Oralia Manisavid Willis, MD   2 mg at 28-Jul-2015 [id number]  . ondansetron (ZOFRAN) tablet 4 mg  4 mg Oral Q6H PRN Oralia Manisavid Willis, MD       Or  . ondansetron [facility](ZOFRAN) injection 4 mg  4 mg Intravenous Q6H PRN Oralia Manisavid Willis, MD   4 mg at 02/13/16 0738  . pantoprazole (PROTONIX) EC tablet 40 mg  40 mg Oral BID Enid Baasadhika Kalisetti, MD   40 mg at 28-Jul-2015 1057  . predniSONE (DELTASONE) tablet 40 mg  40 mg Oral Q breakfast Enid Baasadhika Kalisetti, MD   40 mg at 28-Jul-2015 0942  . senna-docusate (Senokot-S) tablet 1 tablet  1 tablet  Oral BID Enid Baasadhika Kalisetti, MD   1 tablet at 28-Jul-2015 1057  . sodium chloride flush (NS) 0.9 % injection 3 mL  3 mL Intravenous Q12H Oralia Manisavid Willis, MD   10 mL at 28-Jul-2015 [id number]    Past Medical History:  Diagnosis Date  . Atrial fibrillation (HCC)   . Diabetes mellitus without complication (HCC)   . Hypertension    Past Surgical History:  Procedure Laterality Date  . ABDOMINAL HYSTERECTOMY     Social History Social History  Substance Use Topics  . Smoking status: Never Smoker  . Smokeless tobacco: Never Used  . Alcohol use No   Family History Family History  Problem Relation Age of Onset  . Diabetes Mother   . Kidney failure Mother   . Diabetes Father   . Kidney failure Father   . Stroke Father   Denies family history of PAD, Venous or Renal Disease.  Allergies  Allergen Reactions  . Demerol [Meperidine] Nausea And Vomiting  . Reglan [Metoclopramide] Swelling  . Penicillins Swelling and Rash    Swelling to arm at injection site. Has patient had a PCN reaction causing immediate rash, facial/tongue/throat swelling, SOB or lightheadedness with hypotension: Yes Has patient had a PCN reaction causing severe rash involving mucus membranes or skin necrosis: No Has patient had a PCN reaction that required hospitalization No Has patient had a PCN reaction occurring within the last 10 years: No If all of the above answers are "NO", then may proceed with Cephalosporin use.    REVIEW OF SYSTEMS (Negative unless checked)  Constitutional: [] Weight loss  [] Fever  [] Chills Cardiac: [] Chest pain   [] Chest pressure   [x] Palpitations   [] Shortness of breath when laying flat   [] Shortness of breath at rest   [x] Shortness of breath with exertion. Vascular:  [] Pain in legs with walking   [] Pain in legs at rest   [] Pain in legs when laying flat   [] Claudication   [] Pain in feet when walking  [] Pain in feet at rest  [] Pain in feet when laying flat   [] History of DVT   [] Phlebitis   [x] Swelling  in legs   [] Varicose veins   [] Non-healing ulcers Pulmonary:   [] Uses home oxygen   [] Productive cough   [] Hemoptysis   [] Wheeze  [] COPD   [] Asthma Neurologic:  [] Dizziness  [] Blackouts   [] Seizures   [] History of stroke   [] History of TIA  [] Aphasia   [] Temporary blindness   [] Dysphagia   [] Weakness or numbness in arms   [] Weakness or numbness in legs Musculoskeletal:  [] Arthritis   []   Joint swelling   [] Joint pain   [] Low back pain Hematologic:  [] Easy bruising  [] Easy bleeding   [] Hypercoagulable state   [] Anemic  [] Hepatitis Gastrointestinal:  [x] Blood in stool   [] Vomiting blood  [x] Gastroesophageal reflux/heartburn   [] Difficulty swallowing. Genitourinary:  [] Chronic kidney disease   [] Difficult urination  [] Frequent urination  [] Burning with urination   [] Blood in urine Skin:  [] Rashes   [] Ulcers   [] Wounds Psychological:  [] History of anxiety   []  History of major depression.  Physical Examination  Vitals:   02/25/2016 0247 03/05/2016 0506 03/16/2016 0615 03/20/2016 1117  BP: 136/71 (!) 149/113 138/77 (!) 155/65  Pulse: (!) 114 78 (!) 114 78  Resp:  18  19  Temp:  97.8 F (36.6 C)  98 F (36.7 C)  TempSrc:  Oral  Oral  SpO2:  92%  99%  Weight:      Height:       Body mass index is 47.83 kg/m. Gen:  WD/WN, NAD Head: [place]/AT, No temporalis wasting. Prominent temp pulse not noted. Ear/Nose/Throat: Hearing grossly intact, nares w/o erythema or drainage, oropharynx w/o Erythema/Exudate Eyes: PERRLA, EOMI.  Neck: Supple, no nuchal rigidity.  No bruit or JVD.  Pulmonary:  Good air movement, clear to auscultation bilaterally.  Cardiac: Irregularly Irregular Vascular:  Vessel Right Left  Radial Palpable Palpable  Ulnar Palpable Palpable  Brachial Palpable Palpable  Carotid Palpable, without bruit Palpable, without bruit  Aorta Not palpable N/A  Femoral Palpable Palpable  Popliteal Palpable Palpable  PT Palpable Palpable  DP Palpable Palpable   Gastrointestinal: NG in place, sumping  with bilious output. Moderate distention. Minimally tender to palpation. No bowel sounds auscultated.  Musculoskeletal: M/S 5/5 throughout.  Extremities without ischemic changes.  No deformity or atrophy.  Neurologic: CN 2-12 intact. Pain and light touch intact in extremities.  Symmetrical.  Speech is fluent. Motor exam as listed above. Psychiatric: Judgment intact, Mood & affect appropriate for pt's clinical situation. Dermatologic: No rashes or ulcers noted.  No cellulitis or open wounds. Lymph : No Cervical, Axillary, or Inguinal lymphadenopathy.  CBC Lab Results  Component Value Date   WBC 8.7 02/26/2016   HGB 10.7 (L) 03/14/2016   HCT 33.3 (L) 03/05/2016   MCV 92.6 02/28/2016   PLT 222 03/09/2016   BMET    Component Value Date/Time   NA 141 03/05/2016 0430   NA 142 07/10/2013 0418   K 4.6 03/05/2016 0430   K 4.5 07/10/2013 0418   CL 109 03/10/2016 0430   CL 113 (H) 07/10/2013 0418   CO2 28 03/02/2016 0430   CO2 23 07/10/2013 0418   GLUCOSE 143 (H) 03/19/2016 0430   GLUCOSE 149 (H) 07/10/2013 0418   BUN 27 (H) 02/25/2016 0430   BUN 12 07/10/2013 0418   CREATININE 0.88 03/18/2016 0430   CREATININE 1.18 07/10/2013 0418   CALCIUM 6.5 (L) 03/09/2016 0430   CALCIUM 8.6 07/10/2013 0418   GFRNONAA >60 02/28/2016 0430   GFRNONAA 45 (L) 07/10/2013 0418   GFRAA >60 03/10/2016 0430   GFRAA 52 (L) 07/10/2013 0418   Estimated Creatinine Clearance: 76.9 mL/min (by C-G formula based on SCr of 0.88 mg/dL).  COAG Lab Results  Component Value Date   INR 1.18 02/21/2016   INR 1.00 02/19/2016   INR 1.0 07/12/2013   Radiology Ct Abdomen Pelvis Wo Contrast  Result Date: 02/28/2016 CLINICAL DATA:  80 year old female with small bowel obstruction, not improved in recent days. Initial encounter. EXAM: CT ABDOMEN AND PELVIS WITHOUT  CONTRAST TECHNIQUE: Multidetector CT imaging of the abdomen and pelvis was performed following the standard protocol without IV contrast. COMPARISON:  CT  Abdomen and Pelvis 02/21/2016 and earlier. FINDINGS: Lower chest: Small layering bilateral pleural effusions, greater on the right but mildly increased on the left. Stable cardiomegaly. No pericardial effusion. Compressive lower lobe atelectasis with no definite consolidation. No upper abdominal free air or free fluid. Hepatobiliary: some vicarious excretion of contrast to the gallbladder. Negative noncontrast liver. Pancreas: Negative noncontrast pancreas. 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 Adrenals/Urinary Tract: Small volume of residual excreted contrast in normal appearing renal collecting system and ureters. Excreted contrast in the urinary bladder. Punctate gas within the urinary bladder. Negative adrenal glands. Stomach/Bowel: Rectum remains decompressed. Redundant sigmoid colon is largely decompressed. Proximal sigmoid and descending colon diverticulosis continuing into the transverse colon. The left colon and transverse colon are largely decompressed. In the right colon near the hepatic flexure there is a prominent gas containing diverticulum with surrounding inflammatory stranding (series 2, image 43). The right colon also appears somewhat inflamed in general, somewhat different from the remaining large bowel segments. There is trace fluid in the right gutter. Proximal to this the cecum and appendix are stable and grossly normal. The terminal ileum and distal small bowel loops are nondilated. There is a gradual transition from dilated small bowel to the distal decompressed small bowel which can be seen in the right lower abdomen on series 2, image 67. There is an NG tube terminating in the distal stomach. The duodenum is mildly distended. Proximal small bowel loops quickly dilate from the level of the terminal ileum up to a maximum of 54 mm with surrounding mesenteric stranding and trace fluid. No pneumoperitoneum. The bowel then takes  the relatively gradual transition in the right lower abdomen described above. No pneumatosis identified. Vascular/Lymphatic: Aortoiliac calcified atherosclerosis noted. On close inspection of yesterday contrasted enhanced scan I believe there is thrombus within the SMA (series 2, image 37 yesterday). No portal venous gas. Reproductive: Surgically absent uterus. Diminutive or absent adnexa. Other: No pelvic free fluid, but there is a small volume of lower abdominal free fluid in the mesenteric. Musculoskeletal: Stable visualized osseous structures. IMPRESSION: 1. Dilated and inflamed small bowel loops throughout much of the abdomen without improvement since yesterday. There is a gradual transition which argues against mechanical small bowel obstruction. Further, the degree of mesenteric inflammation is atypical. Instead I strongly suspect this is Acute Small Bowel Ischemia rather than mechanical obstruction. Furthermore, the ischemia might also be affecting the right colon. 2. The above critical Value/emergent result were called by telephone at the time of interpretation on 03/13/2016 at 9:18 am to Dr. Katharina Caper, who verbally acknowledged these results. 3. Small volume abdominal and lower mesenteric free fluid. No pneumoperitoneum, pneumatosis or portal venous gas. 4. Small layering pleural effusions, mildly increased on the left since yesterday. 5. NG tube tip at the distal stomach. 6. Trace gas in the urinary bladder. Consider UTI if not explained by recent bladder catheterization. Electronically Signed   By: Odessa Fleming M.D.   On: 03/06/2016 09:21   Dg Chest 1 View  Result Date: 02/20/2016 CLINICAL DATA:  PICC. EXAM: CHEST 1 VIEW COMPARISON:  Four days ago FINDINGS: New left upper extremity PICC with tip at the upper cavoatrial junction. Nasogastric tube at least reaches the stomach. Chronic cardiomegaly. There is no edema, consolidation, effusion, or pneumothorax. IMPRESSION: Left upper extremity PICC with tip  at the upper cavoatrial junction. Electronically Signed   By: Marnee Spring M.D.   On: 02/20/2016 14:40   Dg Chest 1 View  Result Date: 02/14/2016 CLINICAL DATA:  Fever EXAM: CHEST 1 VIEW COMPARISON:  02/19/2016 chest radiograph. FINDINGS: Stable cardiomediastinal silhouette with cardiomegaly and aortic atherosclerosis. No pneumothorax. No pleural effusion. No pulmonary edema. No acute consolidative airspace disease. IMPRESSION: Stable mild cardiomegaly without pulmonary edema. No acute pulmonary disease. Aortic atherosclerosis. Electronically Signed   By: Delbert Phenix M.D.   On: 02/14/2016 21:15   Dg Abd 1 View  Result Date: 02/21/2016 CLINICAL DATA:  80 year old female with history of abdominal pain. Ileus. Weakness. EXAM: ABDOMEN - 1 VIEW COMPARISON:  Abdominal radiograph 02/20/2016. FINDINGS: Nasogastric tube with tip in the mid body of the stomach. Stomach is decompressed. Marked dilatation of small bowel loops measuring up to 6.6 cm in the left upper quadrant of the abdomen. Paucity of distal colonic gas, although some gas is noted in the colon. No gross evidence of pneumoperitoneum. IMPRESSION: 1. The appearance of the abdomen is again suggestive of partial small bowel obstruction, which appears to a worsening compared to prior study 02/20/2016. 2. Tip of nasogastric tube is in the mid body of the stomach. Electronically Signed   By: Trudie Reed M.D.   On: 02/21/2016 09:09   Dg Abd 1 View  Result Date: 02/20/2016 CLINICAL DATA:  Ileus. EXAM: ABDOMEN - 1 VIEW COMPARISON:  One-view abdomen 02/19/2016 FINDINGS: The side port of the NG tube is within the stomach. There is progressive distention of multiple loops of small bowel throughout the abdomen. No definite free air is present on the supine films. IMPRESSION: 1. Progressive distention of multiple loops of small bowel compatible with small bowel obstruction. 2. The side port of the NG tube is within the stomach. Electronically Signed   By:  Marin Roberts M.D.   On: 02/20/2016 08:26   Dg Abd 1 View  Result Date: 02/19/2016 CLINICAL DATA:  80 year old female with enteric tube placement. EXAM: ABDOMEN - 1 VIEW COMPARISON:  Abdominal radiograph dated 02/18/2016 FINDINGS: There has been interval advancement of the enteric tube with tip in the left upper abdomen over the gastric bubble, likely in the proximal stomach. Persistent there is distended loops of small bowel noted in the visualized upper abdomen measuring up to 3.5 cm diameter. IMPRESSION: Enteric tube with tip and side-port in the proximal stomach. Persistent mild air distention of small bowel in the upper abdomen. Electronically Signed   By: Elgie Collard M.D.   On: 02/19/2016 02:34   Dg Abd 1 View  Result Date: 02/18/2016 CLINICAL DATA:  Follow-up ileus, NG tube placement EXAM: ABDOMEN - 1 VIEW COMPARISON:  CT abdomen pelvis of 02/02/2016 and abdomen films of 02/17/2016 FINDINGS: Erect views of the abdomen show gaseous distention of small bowel with differential air-fluid levels. Although this could indicate ileus, a partial small bowel obstruction is a definite consideration. No free air is seen on these erect views of the abdomen. No opaque calculi are seen. IMPRESSION: Gaseous distention of small bowel loops with air-fluid levels suspicious for partial small bowel obstruction. No free air. Electronically Signed   By: Dwyane Dee M.D.   On: 02/18/2016 11:53   Dg Abd 1 View  Result Date: 02/17/2016 CLINICAL DATA:  80 year old female with enteric tube placement. EXAM: ABDOMEN - 1 VIEW COMPARISON:  Abdominal radiograph dated 02/16/2016 and CT dated 01/25/2016 FINDINGS: An enteric tube is noted with tip in stable positioning  below the level of the diaphragm likely in the proximal stomach just distal to the gastroesophageal junction. Multiple dilated and air distended loops of small bowel again noted throughout the abdomen measuring up to 5.2 cm in the left lower quadrant. No  definite free air identified. No radiopaque calculi. The soft tissues and the osseous structures are grossly unremarkable. IMPRESSION: Enteric tube in stable positioning in the proximal stomach. Air distended stomach with persistent dilatation of small-bowel loops. Follow-up recommended. Electronically Signed   By: Elgie Collard M.D.   On: 02/17/2016 03:26   Dg Abd 1 View  Result Date: 02/16/2016 CLINICAL DATA:  Encounter for nasogastric tube placement EXAM: ABDOMEN - 1 VIEW COMPARISON:  02/16/2016 FINDINGS: The nasogastric tube tip is below the GE junction. The side port appears to be above the GE junction. Gaseous distension of the stomach and visualized small bowel loops noted. IMPRESSION: 1. Tip of the nasogastric tube is just below the GE junction within the proximal stomach. Electronically Signed   By: Signa Kell M.D.   On: 02/16/2016 21:40   Dg Abd 1 View  Result Date: 02/16/2016 CLINICAL DATA:  NG tube placement. EXAM: ABDOMEN - 1 VIEW COMPARISON:  Plain film from earlier same day. FINDINGS: Nasogastric tube is coiled on itself with tip directed upwards. Tip is above the upper margin of this abdomen x-ray. Again noted is gaseous distention of the colon and upper abdominal small bowel, incompletely imaged. IMPRESSION: Nasogastric tube is coiled on itself within the esophagus. Tip is directed upwards and is located above the upper portion of this x-ray, at least above the level of the midesophagus. Recommend repositioning. These results were called by telephone at the time of interpretation on 02/16/2016 at 7:23 pm to the patient's nursewho verbally acknowledged these results. Electronically Signed   By: Bary Richard M.D.   On: 02/16/2016 19:24   Dg Abd 1 View  Result Date: 02/16/2016 CLINICAL DATA:  80 y/o F; abdominal distention with diarrhea, abdominal pain, and emesis. EXAM: ABDOMEN - 1 VIEW COMPARISON:  01/26/2016 CT of the abdomen and pelvis. FINDINGS: There has been interval  development of multiple dilated loops of small bowel which may represents ileus or downstream obstruction. Moderate gastric distention. Mild levocurvature and degenerative changes of lower lumbar spine. IMPRESSION: Interval development of multiple dilated loops of small bowel which may represent ileus or downstream obstruction. Moderate gastric distention. Electronically Signed   By: Mitzi Hansen M.D.   On: 02/16/2016 15:25   Ct Head Wo Contrast  Result Date: 02/14/2016 CLINICAL DATA:  Altered mental status.  Inpatient. EXAM: CT HEAD WITHOUT CONTRAST TECHNIQUE: Contiguous axial images were obtained from the base of the skull through the vertex without intravenous contrast. COMPARISON:  None. FINDINGS: Brain: No evidence of parenchymal hemorrhage or extra-axial fluid collection. No mass lesion, mass effect, or midline shift. No CT evidence of acute infarction. Intracranial atherosclerosis. Generalized cerebral volume loss. Nonspecific moderate subcortical and periventricular white matter hypodensity, most in keeping with chronic small vessel ischemic change. No ventriculomegaly. Vascular: No hyperdense vessel or unexpected calcification. Skull: No evidence of calvarial fracture. Sinuses/Orbits: The visualized paranasal sinuses are essentially clear. Other: Patient is edentulous. The mastoid air cells are unopacified. IMPRESSION: 1.  No evidence of acute intracranial abnormality. 2. Generalized cerebral volume loss and moderate chronic small vessel ischemia. Electronically Signed   By: Delbert Phenix M.D.   On: 02/14/2016 17:38   US Abdomen Complete  Result Date: 02/17/2016 CLINICAL DATA:  Abdominal distention for 3  days. EXAM: ABDOMEN ULTRASOUND COMPLETE COMPARISON:  None. FINDINGS: Gallbladder: No gallstones or wall thickening visualized. No sonographic Murphy sign noted by sonographer. Probable mild amount of sludge is noted within gallbladder lumen. Common bile duct: Diameter: 6.5 mm which is  within normal limits. Liver: No focal lesion identified. Increased echogenicity is noted consistent with fatty infiltration. IVC: No abnormality visualized. Pancreas: Not visualized due to overlying bowel gas. Spleen: Size and appearance within normal limits. Right Kidney: Length: 11.1 cm. Echogenicity within normal limits. No mass or hydronephrosis visualized. Left Kidney: Length: 11.8 cm. Echogenicity within normal limits. No mass or hydronephrosis visualized. Abdominal aorta: No aneurysm visualized. Other findings: None. IMPRESSION: Pancreas not visualized due due to overlying bowel gas. Fatty infiltration of the liver is noted. Probable gallbladder sludge noted. Electronically Signed   By: Lupita Raider, M.D.   On: 02/17/2016 10:12   Ct Abdomen Pelvis W Contrast  Result Date: 02/21/2016 CLINICAL DATA:  Evaluate small bowel obstruction. EXAM: CT ABDOMEN AND PELVIS WITH CONTRAST TECHNIQUE: Multidetector CT imaging of the abdomen and pelvis was performed using the standard protocol following bolus administration of intravenous contrast. CONTRAST:  ISOVUE-300 IOPAMIDOL (ISOVUE-300) INJECTION 61% COMPARISON:  KUB from February 21, 2016 and CT scan from February 12, 2016 FINDINGS: Lower chest: Small bilateral effusions and underlying atelectasis are identified. Lung bases are otherwise unchanged an unremarkable. Hepatobiliary: The liver and portal vein are within normal limits. The gallbladder is mildly distended but otherwise unremarkable. Pancreas: Unremarkable. No pancreatic ductal dilatation or surrounding inflammatory changes. Spleen: Normal in size without focal abnormality. Adrenals/Urinary Tract: Adrenal glands are unremarkable. Kidneys are normal, without renal calculi, focal lesion, or hydronephrosis. Bladder is unremarkable. Stomach/Bowel: An NG tube terminates in the distal stomach. There is a small bowel obstruction with a transition point in the right side of the abdomen, likely the mid  ileum, best seen on coronal images 39 through 42. Colonic diverticulosis is seen without diverticulitis. The colon is relatively decompressed. Limited views of the appendix are normal with no appendicitis. Vascular/Lymphatic: Atherosclerosis is seen in the non aneurysmal aorta and iliac vessels. No adenopathy. Reproductive: The patient is status post hysterectomy. No pelvic masses. Other: Air in the bladder is likely due to recent catheterization. Ascites in the mesenteries likely secondary to the bowel obstruction. Musculoskeletal: Anterior wedging of T12 is unchanged. No acute bony abnormalities. IMPRESSION: 1. Small bowel obstruction. The transition point appears to be in the mid ileum. 2. Small bilateral effusions and underlying atelectasis. 3. Atherosclerosis in the abdominal aorta and iliac vessels. Electronically Signed   By: Gerome Sam III M.D   On: 02/21/2016 15:19   Ct Abdomen Pelvis W Contrast  Result Date: 02/07/2016 CLINICAL DATA:  Acute onset of epigastric abdominal pain and nausea. Vomiting and diarrhea. Initial encounter. EXAM: CT ABDOMEN AND PELVIS WITH CONTRAST TECHNIQUE: Multidetector CT imaging of the abdomen and pelvis was performed using the standard protocol following bolus administration of intravenous contrast. CONTRAST:  75mL ISOVUE-300 IOPAMIDOL (ISOVUE-300) INJECTION 61% COMPARISON:  PET/CT performed 07/09/2013 FINDINGS: Lower chest: Mild bibasilar atelectasis is noted. The previously noted right lower lobe masslike density has resolved. A small to moderate hiatal hernia is seen, with fluid filling the distal esophagus. Hepatobiliary: The liver is unremarkable in appearance. The gallbladder is unremarkable in appearance. The common bile duct remains normal in caliber. Pancreas: The pancreas is within normal limits. Spleen: The spleen is unremarkable in appearance. A likely small calcified aneurysm is noted at the splenic hilum. Adrenals/Urinary Tract: The  adrenal glands are  unremarkable in appearance. Mild bilateral renal atrophy and scarring are noted, with underlying perinephric stranding. There is no evidence of hydronephrosis. No renal or ureteral stones are identified. Stomach/Bowel: The stomach is otherwise unremarkable in appearance. The small bowel is within normal limits. The appendix is normal in caliber, without evidence of appendicitis. Mild diverticulosis is noted along the descending and sigmoid colon, without evidence of diverticulitis. Vascular/Lymphatic: Scattered calcification is seen along the abdominal aorta and its branches. The abdominal aorta is otherwise grossly unremarkable. The inferior vena cava is grossly unremarkable. No retroperitoneal lymphadenopathy is seen. No pelvic sidewall lymphadenopathy is identified. Reproductive: The bladder is significantly distended and grossly unremarkable. The patient is status post hysterectomy. No suspicious adnexal masses are seen. Other: No additional soft tissue abnormalities are seen. Musculoskeletal: No acute osseous abnormalities are identified. Endplate sclerotic change is noted at L4-L5, with associated vacuum phenomenon along the lower lumbar spine. There is chronic loss of height at vertebral body T12. The visualized musculature is unremarkable in appearance. IMPRESSION: 1. No acute abnormality seen to explain the patient's symptoms. 2. Small to moderate hiatal hernia noted. Fluid fills the distal esophagus, likely reflecting mild esophageal dysmotility or gastroesophageal reflux. 3. Mild bibasilar atelectasis noted. 4. Aortic atherosclerosis. 5. Mild bilateral renal atrophy and scarring noted. 6. Mild diverticulosis along the descending and sigmoid colon, without evidence of diverticulitis. 7. Chronic loss of height at vertebral body T12, and mild degenerative change at the lower lumbar spine. Electronically Signed   By: Roanna Raider M.D.   On: 02/16/2016 21:12   Dg Chest Port 1 View  Result Date:  02/16/2016 CLINICAL DATA:  80 year old female with cough. EXAM: PORTABLE CHEST 1 VIEW COMPARISON:  Chest radiograph dated 02/14/2016 and CT dated 07/06/2013 FINDINGS: Single portable view of the chest demonstrates clear lungs. There is no pleural effusion or pneumothorax. Stable cardiomegaly. There is degenerative changes of the spine and shoulders. No acute fracture. IMPRESSION: No acute cardiopulmonary process. Stable mild cardiomegaly. Electronically Signed   By: Elgie Collard M.D.   On: 02/16/2016 05:30   Dg Chest Portable 1 View  Result Date: 02/21/2016 CLINICAL DATA:  Initial evaluation for worsening shortness of breath. EXAM: PORTABLE CHEST 1 VIEW COMPARISON:  Prior radiograph from earlier the same day. FINDINGS: Stable cardiomegaly.  Mediastinal silhouette within normal limits. Lungs normally inflated. Overall pulmonary vascularity is not significantly changed without significant worsening edema. No pleural effusion. No focal infiltrates. No pneumothorax. Osseous structures unchanged. IMPRESSION: Stable cardiomegaly without radiographic evidence for significant edema. No other acute cardiopulmonary abnormality identified. Electronically Signed   By: Rise Mu M.D.   On: 02/04/2016 23:33   Dg Chest Port 1 View  Result Date: 01/24/2016 CLINICAL DATA:  80 y/o F; nausea vomiting and diarrhea with pain starting at approximately 12 p.m. today. EXAM: PORTABLE CHEST 1 VIEW COMPARISON:  07/12/2013 chest radiograph. FINDINGS: Stable enlarged cardiac silhouette given differences in technique. Aortic atherosclerosis with calcifications of the arch. No focal consolidation, pneumothorax, or pleural effusion. IMPRESSION: No active disease.  Stable cardiomegaly and aortic atherosclerosis. Electronically Signed   By: Mitzi Hansen M.D.   On: 02/04/2016 19:26   Dg Abd Acute W/chest  Result Date: 02/21/2016 CLINICAL DATA:  Small-bowel obstruction, admitted 01/24/2016. No bowel movement  today. EXAM: DG ABDOMEN ACUTE W/ 1V CHEST COMPARISON:  Abdominal x-ray and CT abdomen dated 02/21/2016. Chest x-rays dated 02/20/2016 and 02/16/2016. FINDINGS: Single view of the chest: Mild cardiomegaly is stable. Overall cardiomediastinal silhouette appears stable in  size and configuration. Left-sided PICC line is adequately positioned with tip at the level of the mid SVC. Lungs are clear. Upright and supine views of the abdomen: Again noted are diffusely dilated gas-filled loops of small bowel throughout the abdomen an upper pelvis, stable compared to plain film from earlier today, not significantly changed compared to the earlier plain film of 02/16/2016. No evidence of free intraperitoneal air. IMPRESSION: 1. Continued evidence of small bowel obstruction, not significantly changed compared to the plain film from earlier today, also not significantly changed compared to the earlier plain film of 02/16/2016. Transition point within the mid ileum, per today's earlier CT report. 2. Lungs are clear and there is no evidence of acute cardiopulmonary abnormality. Electronically Signed   By: Bary Richard M.D.   On: 02/21/2016 18:17   Assessment/Plan The patient is a 80 year old female with a PMHx of atrial fibrillation, DM and HTN with prolonged ileus. Most recent CT with possible SMA occlusion. 1) Possible SMA occlusion: last CT without IV contrast - possible SMA occlusion present however hard to make definitive diagnosis. Recommend mesenteric angiogram today to assess for any occlusion and if found treat it at that time. Procedure, risks and benefits explained to patient. All questions answered. Patent and family who were present in room at time of examination are in agreement and wish to proceed.  2) UncontrolledDiabetes: due to medication non-compliance on admission. Management as per primary and diabetes nurse. Will need education on importance of taking medications. 3) Atrial fibrillation with RVR - being  managed by cardiology. Due to medication non-compliance on admission. Will need education on importance of taking medications. 4) SBO vs SMA occlusion - if no SMA occlusion on angiogram today. Patient may still need ex-lap to determine cause of ileus. 5) Discussed with Dr. Romie Jumper, PA-C  03/01/2016 12:15 PM    This note was created with Dragon medical transcription system.  Any error is purely unintentional

## 2016-02-22 NOTE — Progress Notes (Addendum)
Initial Nutrition Assessment  DOCUMENTATION CODES:   Morbid obesity  INTERVENTION:  -RD consulted to begin TPN yesterday -Recommend Clinimix E 5/15 goal rate of 4770mL/hr, begin @ 2035mL/hr today, increase to 70 tomorrow -Recommend 20% fat emulsion @ 4715mL/hr --> 72gm fat (~1g/kg ABW) -At goal, Regimen provides 1912 calories, 84gm protein, and 2040cc fluid  NUTRITION DIAGNOSIS:   Inadequate oral intake related to altered GI function as evidenced by per patient/family report.  GOAL:   Patient will meet greater than or equal to 90% of their needs  MONITOR:   Labs, Weight trends, I & O's, PO intake, Diet advancement  REASON FOR ASSESSMENT:   Consult New TPN/TNA  ASSESSMENT:   [name]  is a 80 y.o. female who presents with Acute onset abdominal pain with coffee-ground emesis and hematochezia starting today just after lunch time. Here in the ED her blood pressure stable, hemoglobin is stable, she has persistent abdominal pain and nausea.  Spoke with [name] at bedside. She endorses inability to eat since last night with nausea/vomiting. Pt states she has not had a BM since last Saturday or Sunday. She is unsure of wt status, unable to determine from chart. Currently has NGT to suction - 800mL dark brown output. Nutrition-Focused physical exam completed. Findings are no fat depletion, no muscle depletion, and mild edema.  RD consulted to begin TPN yesterday, [facility] but it appears it was never ordered. Spoke with Dr. Seth BakeV, Hospitalist, wants TPN to be initiated, pt is currently suffering from possible ischemic bowel, awaiting vascular eval. Spoke with Pharmacy, will be initiated today. Labs and medications reviewed: Prednisone, Senokot-S NS w/ K+ @ 2175mL/hr Amiodarone drip  Diet Order:  Diet NPO time specified Except for: Ice Chips, Sips with Meds Diet NPO time specified  Skin:  Reviewed, no issues  Last BM:  929  Height:   Ht Readings from  Last 1 Encounters:  02/16/16 5\' 7"  (1.702 m)    Weight:   Wt Readings from Last 1 Encounters:  02/16/16 (!) 305 lb 6.4 oz (138.5 kg)    Ideal Body Weight:  61.36 kg  BMI:  Body mass index is 47.83 kg/m.  Estimated Nutritional Needs:   Kcal:  1800-2200 calories  Protein:  60-80 gm  Fluid:  >/= 1.8L  EDUCATION NEEDS:   No education needs identified at this time  Dionne AnoWilliam M. Endrit Gittins, MS, RD LDN Inpatient Clinical Dietitian Pager [phone]

## 2016-02-22 NOTE — Progress Notes (Signed)
Pt down to OR family went along with her.

## 2016-02-22 DEATH — deceased

## 2016-02-23 ENCOUNTER — Inpatient Hospital Stay: Payer: Commercial Managed Care - HMO

## 2016-02-23 LAB — BASIC METABOLIC PANEL
Anion gap: 6 (ref 5–15)
BUN: 22 mg/dL — AB (ref 6–20)
CALCIUM: 6.7 mg/dL — AB (ref 8.9–10.3)
CO2: 29 mmol/L (ref 22–32)
CREATININE: 0.96 mg/dL (ref 0.44–1.00)
Chloride: 104 mmol/L (ref 101–111)
GFR calc Af Amer: >60 mL/min (ref >60)
GFR, EST NON AFRICAN AMERICAN: 55 mL/min — AB (ref >60)
GLUCOSE: 184 mg/dL — AB (ref 65–99)
POTASSIUM: 3.6 mmol/L (ref 3.5–5.1)
SODIUM: 139 mmol/L (ref 135–145)

## 2016-02-23 LAB — GLUCOSE, CAPILLARY
GLUCOSE-CAPILLARY: 198 mg/dL — AB (ref 65–99)
GLUCOSE-CAPILLARY: 194 mg/dL — AB (ref 65–99)
Glucose-Capillary: 167 mg/dL — ABNORMAL HIGH (ref 65–99)
Glucose-Capillary: 181 mg/dL — ABNORMAL HIGH (ref 65–99)
Glucose-Capillary: 210 mg/dL — ABNORMAL HIGH (ref 65–99)
Glucose-Capillary: 276 mg/dL — ABNORMAL HIGH (ref 65–99)

## 2016-02-23 LAB — CBC
HEMATOCRIT: 31.4 % — AB (ref 35.0–47.0)
HEMOGLOBIN: 10.6 g/dL — AB (ref 12.0–16.0)
MCH: 30.8 pg (ref 26.0–34.0)
MCHC: 33.6 g/dL (ref 32.0–36.0)
MCV: 91.7 fL (ref 80.0–100.0)
Platelets: 218 10*3/uL (ref 150–440)
RBC: 3.43 MIL/uL — AB (ref 3.80–5.20)
RDW: 14.4 % (ref 11.5–14.5)
WBC: 9.5 10*3/uL (ref 3.6–11.0)

## 2016-02-23 LAB — HEPARIN LEVEL (UNFRACTIONATED)
HEPARIN UNFRACTIONATED: 0.13 [IU]/mL — AB (ref 0.30–0.70)
HEPARIN UNFRACTIONATED: 0.34 [IU]/mL (ref 0.30–0.70)

## 2016-02-23 LAB — MAGNESIUM: MAGNESIUM: 1.8 mg/dL (ref 1.7–2.4)

## 2016-02-23 LAB — DIGOXIN LEVEL: Digoxin Level: 1.2 ng/mL (ref 0.8–2.0)

## 2016-02-23 LAB — PHOSPHORUS: Phosphorus: 3.8 mg/dL (ref 2.5–4.6)

## 2016-02-23 LAB — ALBUMIN: Albumin: 1.4 g/dL — ABNORMAL LOW (ref 3.5–5.0)

## 2016-02-23 LAB — TRIGLYCERIDES: TRIGLYCERIDES: 81 mg/dL (ref <150)

## 2016-02-23 MED ORDER — DIGOXIN 0.25 MG/ML IJ SOLN
0.1250 mg | INTRAMUSCULAR | Status: DC
  Administered 2016-02-25 – 2016-02-29 (×3): 0.125 mg via INTRAVENOUS
  Filled 2016-02-23: qty 0.5
  Filled 2016-02-23: qty 2
  Filled 2016-02-23: qty 0.5

## 2016-02-23 MED ORDER — HEPARIN BOLUS VIA INFUSION
2800.0000 [IU] | Freq: Once | INTRAVENOUS | Status: AC
  Administered 2016-02-23: 2800 [IU] via INTRAVENOUS
  Filled 2016-02-23: qty 2800

## 2016-02-23 MED ORDER — TRACE MINERALS CR-CU-MN-SE-ZN 10-1000-500-60 MCG/ML IV SOLN
INTRAVENOUS | Status: AC
  Administered 2016-02-23: 18:00:00 via INTRAVENOUS
  Filled 2016-02-23: qty 1680

## 2016-02-23 MED ORDER — ORAL CARE MOUTH RINSE
15.0000 mL | Freq: Two times a day (BID) | OROMUCOSAL | Status: DC
  Administered 2016-02-23 – 2016-02-27 (×8): 15 mL via OROMUCOSAL

## 2016-02-23 MED ORDER — FAT EMULSION 20 % IV EMUL
500.0000 mL | INTRAVENOUS | Status: AC
  Administered 2016-02-23: 500 mL via INTRAVENOUS
  Filled 2016-02-23: qty 500

## 2016-02-23 MED ORDER — PANTOPRAZOLE SODIUM 40 MG IV SOLR
40.0000 mg | Freq: Two times a day (BID) | INTRAVENOUS | Status: DC
Start: 2016-02-23 — End: 2016-02-29
  Administered 2016-02-23 – 2016-02-29 (×12): 40 mg via INTRAVENOUS
  Filled 2016-02-23 (×12): qty 40

## 2016-02-23 MED ORDER — INSULIN GLARGINE 100 UNIT/ML ~~LOC~~ SOLN
18.0000 [IU] | Freq: Two times a day (BID) | SUBCUTANEOUS | Status: DC
  Administered 2016-02-23: 18 [IU] via SUBCUTANEOUS
  Filled 2016-02-23 (×4): qty 0.18

## 2016-02-23 NOTE — Plan of Care (Signed)
Problem: Pain Managment: Goal: General experience of comfort will improve Outcome: Progressing NG tube in place on low intermittent suction. Patient reports stomach is feeling much better.

## 2016-02-23 NOTE — Progress Notes (Signed)
ANTICOAGULATION CONSULT NOTE - Follow Up Consult  Pharmacy Consult for Heparin Indication: ? SMA occlusion  Allergies  Allergen Reactions  . Demerol [Meperidine] Nausea And Vomiting  . Reglan [Metoclopramide] Swelling  . Penicillins Swelling and Rash    Swelling to arm at injection site. Has patient had a PCN reaction causing immediate rash, facial/tongue/throat swelling, SOB or lightheadedness with hypotension: Yes Has patient had a PCN reaction causing severe rash involving mucus membranes or skin necrosis: No Has patient had a PCN reaction that required hospitalization No Has patient had a PCN reaction occurring within the last 10 years: No If all of the above answers are "NO", then may proceed with Cephalosporin use.     Patient Measurements: Height: 5\' 7"  (170.2 cm) Weight: (!) 305 lb 6.4 oz (138.5 kg) IBW/kg (Calculated) : 61.6 Heparin Dosing Weight: 95.5 kg  Vital Signs: Temp: 97.9 F (36.6 C) (10/02 1203) Temp Source: Oral (10/02 1203) BP: 132/65 (10/02 1736) Pulse Rate: 83 (10/02 1736)  Labs:  Recent Labs  02/21/16 0444 02/21/16 1155 02-28-16 0430 2016-02-28 2243 02/23/16 0515 02/23/16 0942 02/23/16 1911  HGB 11.0*  --  10.7*  --  10.6*  --   --   HCT 34.2*  --  33.3*  --  31.4*  --   --   PLT 209  --  222  --  218  --   --   APTT  --  29  --   --   --   --   --   LABPROT  --  15.1  --   --   --   --   --   INR  --  1.18  --   --   --   --   --   HEPARINUNFRC  --   --   --  0.32  --  0.13* 0.34  CREATININE 0.97  --  0.88  --  0.96  --   --     Estimated Creatinine Clearance: 70.4 mL/min (by C-G formula based on SCr of 0.96 mg/dL).   Medications:  Scheduled:  . bisacodyl  10 mg Rectal Daily  . ciprofloxacin  400 mg Intravenous BID  . [START ON 02/25/2016] digoxin  0.125 mg Intravenous Q48H  . digoxin  0.5 mg Intravenous Once  . insulin aspart  0-15 Units Subcutaneous Q4H  . insulin glargine  18 Units Subcutaneous BID  . mouth rinse  15 mL Mouth  Rinse BID  . metoprolol  5 mg Intravenous Q6H  . metronidazole  500 mg Intravenous Q8H  . pantoprazole (PROTONIX) IV  40 mg Intravenous Q12H  . senna-docusate  1 tablet Oral BID  . sodium chloride flush  3 mL Intravenous Q12H   Infusions:  . 0.9 % NaCl with KCl 20 mEq / L 30 mL/hr at Feb 28, 2016 2101  . amiodarone 30 mg/hr (02/23/16 1741)  . Marland KitchenTPN (CLINIMIX-E) Adult 70 mL/hr at 02/23/16 1753   And  . fat emulsion 500 mL (02/23/16 1744)  . heparin 1,650 Units/hr (02/23/16 1035)    Assessment: Pharmacy consulted to initiate heparin drip for 80 yo female being treated for possible SMA occulusion. Patient admitted with GI bleed. Patient with Afib not on chronic anticoagulation due to GI bleeds.  10/2 09:30 Heparin level resulted at 0.13  10/2 1911 HL 0.34  Goal of Therapy:  Heparin level 0.3-0.7 units/ml Monitor platelets by anticoagulation protocol: Yes   Plan:  Heparin level therapeutic. Will recheck level with AM labs.  Cher NakaiSheema Cecilie Heidel, PharmD Clinical Pharmacist 02/23/2016 8:06 PM

## 2016-02-23 NOTE — Progress Notes (Signed)
[facility] [place] [facility]   PATIENT NAME: [name]    MR#:  [id number]  DATE OF BIRTH:  18-Nov-1935  SUBJECTIVE:  CHIEF COMPLAINT:   Chief Complaint  Patient presents with  . Diarrhea  . Abdominal Pain  . Emesis   - The patient remains in A. fib, rate is 120, now on amiodarone IV drip, Cardizem, metoprolol, IV,  NGT  drainage about 1 L over the past 24 hours. Every stool output with an enema, pain with an enema, otherwise abdominal pain is not existent. Milder NG tube drainage, of about 400 ml today. No nausea. 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 Lactic acid level was normal.Patient underwent mechanical thrombectomy by Dr. Gilda Crease, PTA, clearing clot from SMA Patient denies any abdominal pain, overall feels relatively comfortable REVIEW OF SYSTEMS:  Review of Systems  Constitutional: Positive for malaise/fatigue. Negative for chills and fever.  HENT: Negative for ear discharge, ear pain and tinnitus.   Eyes: Negative for blurred vision and double vision.  Respiratory: Positive for shortness of breath. Negative for cough and wheezing.   Cardiovascular: Negative for chest pain, palpitations and leg swelling.  Gastrointestinal: Negative for abdominal pain, constipation, diarrhea, nausea and vomiting.       Abdominal distention  Genitourinary: Negative for dysuria and urgency.  Musculoskeletal: Negative for myalgias.  Neurological: Negative for dizziness, sensory change, speech change, focal weakness, seizures and headaches.  Psychiatric/Behavioral: Negative for depression.    DRUG ALLERGIES:   Allergies  Allergen Reactions  . Demerol [Meperidine] Nausea And Vomiting  . Reglan [Metoclopramide] Swelling  . Penicillins Swelling and Rash    Swelling to arm at injection site. Has patient had a PCN reaction causing immediate rash, facial/tongue/throat swelling, SOB or  lightheadedness with hypotension: Yes Has patient had a PCN reaction causing severe rash involving mucus membranes or skin necrosis: No Has patient had a PCN reaction that required hospitalization No Has patient had a PCN reaction occurring within the last 10 years: No If all of the above answers are "NO", then may proceed with Cephalosporin use.     VITALS:  Blood pressure (!) 137/58, pulse 92, temperature 97.9 F (36.6 C), temperature source Oral, resp. rate 18, height 5\' 7"  (1.702 m), weight (!) 138.5 kg (305 lb 6.4 oz), SpO2 98 %.  PHYSICAL EXAMINATION:  Physical Exam  GENERAL:  80 y.o.-year-old patient lying in the bed in mild  distress, somewhat uncomfortable, disheveled.  EYES: Pupils equal, round, reactive to light and accommodation. No scleral icterus. Extraocular muscles intact.  HEENT: Head atraumatic, normocephalic. Oropharynx and nasopharynx clear. NG tube in place with continuous suction NECK:  Supple, no jugular venous distention. No thyroid enlargement, no tenderness.  LUNGS: diminished breath sounds bilaterally at bases, no wheezing, rales,rhonchi or crepitation. No use of accessory muscles of respiration. Decreased bibasilar breath sounds CARDIOVASCULAR: S1, S1, irregular irregular, better rate control. No murmurs, rubs, or gallops.  ABDOMEN: Soft, obese, distended, non tender. Hypoactive Bowel sounds present. No organomegaly or mass. No guarding EXTREMITIES: Trace pedal edema, no cyanosis, or clubbing.  NEUROLOGIC: Cranial nerves II through XII are intact. Muscle strength 5/5 in all extremities. Sensation intact. Gait not checked.  PSYCHIATRIC: The patient is alert and oriented x 3.  SKIN: No obvious rash, lesion, or ulcer.    LABORATORY PANEL:   CBC  Recent Labs Lab 02/23/16 0515  WBC 9.5  HGB 10.6*  HCT 31.4*  PLT 218   ------------------------------------------------------------------------------------------------------------------  Chemistries    Recent Labs Lab 2016-03-04 1221 02/23/16 0515  NA  --  139  K  --  3.6  CL  --  104  CO2  --  29  GLUCOSE  --  184*  BUN  --  22*  CREATININE  --  0.96  CALCIUM  --  6.7*  MG  --  1.8  AST 12*  --   ALT 8*  --   ALKPHOS 49  --   BILITOT 0.8  --    ------------------------------------------------------------------------------------------------------------------  Cardiac Enzymes No results for input(s): TROPONINI in the last 168 hours. ------------------------------------------------------------------------------------------------------------------  RADIOLOGY:  Ct Abdomen Pelvis Wo Contrast  Result Date: 03-04-2016 CLINICAL DATA:  80 year old female with small bowel obstruction, not improved in recent days. Initial encounter. EXAM: CT ABDOMEN AND PELVIS WITHOUT CONTRAST TECHNIQUE: Multidetector CT imaging of the abdomen and pelvis was performed following the standard protocol without IV contrast. COMPARISON:  CT Abdomen and Pelvis 02/21/2016 and earlier. FINDINGS: Lower chest: Small layering bilateral pleural effusions, greater on the right but mildly increased on the left. Stable cardiomegaly. No pericardial effusion. Compressive lower lobe atelectasis with no definite consolidation. No upper abdominal free air or free fluid. Hepatobiliary: some vicarious excretion of contrast to the gallbladder. Negative noncontrast liver. Pancreas: Negative noncontrast pancreas. 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 Adrenals/Urinary Tract: Small volume of residual excreted contrast in normal appearing renal collecting system and ureters. Excreted contrast in the urinary bladder. Punctate gas within the urinary bladder. Negative adrenal glands. Stomach/Bowel: Rectum remains decompressed. Redundant sigmoid colon is largely decompressed. Proximal sigmoid and descending colon diverticulosis continuing into the transverse colon. The left colon  and transverse colon are largely decompressed. In the right colon near the hepatic flexure there is a prominent gas containing diverticulum with surrounding inflammatory stranding (series 2, image 43). The right colon also appears somewhat inflamed in general, somewhat different from the remaining large bowel segments. There is trace fluid in the right gutter. Proximal to this the cecum and appendix are stable and grossly normal. The terminal ileum and distal small bowel loops are nondilated. There is a gradual transition from dilated small bowel to the distal decompressed small bowel which can be seen in the right lower abdomen on series 2, image 67. There is an NG tube terminating in the distal stomach. The duodenum is mildly distended. Proximal small bowel loops quickly dilate from the level of the terminal ileum up to a maximum of 54 mm with surrounding mesenteric stranding and trace fluid. No pneumoperitoneum. The bowel then takes the relatively gradual transition in the right lower abdomen described above. No pneumatosis identified. Vascular/Lymphatic: Aortoiliac calcified atherosclerosis noted. On close inspection of yesterday contrasted enhanced scan I believe there is thrombus within the SMA (series 2, image 37 yesterday). No portal venous gas. Reproductive: Surgically absent uterus. Diminutive or absent adnexa. Other: No pelvic free fluid, but there is a small volume of lower abdominal free fluid in the mesenteric. Musculoskeletal: Stable visualized osseous structures. IMPRESSION: 1. Dilated and inflamed small bowel loops throughout much of the abdomen without improvement since yesterday. There is a gradual transition which argues against mechanical small bowel obstruction. Further, the degree of mesenteric inflammation is atypical. Instead I strongly suspect this is Acute Small Bowel Ischemia rather than mechanical obstruction. Furthermore, the ischemia might also be affecting the right colon. 2. The  above critical Value/emergent result were called by telephone at the time of interpretation  on 02/28/2016 at 9:18 am to Dr. Katharina Caperima Alvena Kiernan, who verbally acknowledged these results. 3. Small volume abdominal and lower mesenteric free fluid. No pneumoperitoneum, pneumatosis or portal venous gas. 4. Small layering pleural effusions, mildly increased on the left since yesterday. 5. NG tube tip at the distal stomach. 6. Trace gas in the urinary bladder. Consider UTI if not explained by recent bladder catheterization. Electronically Signed   By: Odessa FlemingH  Hall M.D.   On: 03/05/2016 09:21   Ct Abdomen Pelvis W Contrast  Result Date: 02/21/2016 CLINICAL DATA:  Evaluate small bowel obstruction. EXAM: CT ABDOMEN AND PELVIS WITH CONTRAST TECHNIQUE: Multidetector CT imaging of the abdomen and pelvis was performed using the standard protocol following bolus administration of intravenous contrast. CONTRAST:  100mL ISOVUE-300 IOPAMIDOL (ISOVUE-300) INJECTION 61% COMPARISON:  KUB from February 21, 2016 and CT scan from February 12, 2016 FINDINGS: Lower chest: Small bilateral effusions and underlying atelectasis are identified. Lung bases are otherwise unchanged an unremarkable. Hepatobiliary: The liver and portal vein are within normal limits. The gallbladder is mildly distended but otherwise unremarkable. Pancreas: Unremarkable. No pancreatic ductal dilatation or surrounding inflammatory changes. Spleen: Normal in size without focal abnormality. Adrenals/Urinary Tract: Adrenal glands are unremarkable. Kidneys are normal, without renal calculi, focal lesion, or hydronephrosis. Bladder is unremarkable. Stomach/Bowel: An NG tube terminates in the distal stomach. There is a small bowel obstruction with a transition point in the right side of the abdomen, likely the mid ileum, best seen on coronal images 39 through 42. Colonic diverticulosis is seen without diverticulitis. The colon is relatively decompressed. Limited views of the  appendix are normal with no appendicitis. Vascular/Lymphatic: Atherosclerosis is seen in the non aneurysmal aorta and iliac vessels. No adenopathy. Reproductive: The patient is status post hysterectomy. No pelvic masses. Other: Air in the bladder is likely due to recent catheterization. Ascites in the mesenteries likely secondary to the bowel obstruction. Musculoskeletal: Anterior wedging of T12 is unchanged. No acute bony abnormalities. IMPRESSION: 1. Small bowel obstruction. The transition point appears to be in the mid ileum. 2. Small bilateral effusions and underlying atelectasis. 3. Atherosclerosis in the abdominal aorta and iliac vessels. Electronically Signed   By: Gerome Samavid  Williams III M.D   On: 02/21/2016 15:19   Dg Abd Acute W/chest  Result Date: 02/23/2016 CLINICAL DATA:  Ileus. New onset abdominal pain. Small bowel obstruction. EXAM: DG ABDOMEN ACUTE W/ 1V CHEST COMPARISON:  CT abdomen and pelvis 03/04/2016 FINDINGS: The heart is enlarged.  Lung volumes are low. Multiple low dilated loops of small bowel are again seen. There is some gas within the colon NG tube is in the stomach. The stomach is decompressed. The largest small bowel loops are in the left upper quadrant measuring up to 5.8 cm. Fluid levels are present. IMPRESSION: 1. Persistent ileus versus small bowel obstruction with partial decompression of the small bowel following NG tube placement. 2. The side port of the NG tube is in the stomach. 3. Low lung volumes and mild bibasilar atelectasis. Electronically Signed   By: Marin Robertshristopher  Mattern M.D.   On: 02/23/2016 08:18   Dg Abd Acute W/chest  Result Date: 02/21/2016 CLINICAL DATA:  Small-bowel obstruction, admitted 12/10/15. No bowel movement today. EXAM: DG ABDOMEN ACUTE W/ 1V CHEST COMPARISON:  Abdominal x-ray and CT abdomen dated 02/21/2016. Chest x-rays dated 02/20/2016 and 02/16/2016. FINDINGS: Single view of the chest: Mild cardiomegaly is stable. Overall cardiomediastinal  silhouette appears stable in size and configuration. Left-sided PICC line is adequately positioned with tip at the  level of the mid SVC. Lungs are clear. Upright and supine views of the abdomen: Again noted are diffusely dilated gas-filled loops of small bowel throughout the abdomen an upper pelvis, stable compared to plain film from earlier today, not significantly changed compared to the earlier plain film of 02/16/2016. No evidence of free intraperitoneal air. IMPRESSION: 1. Continued evidence of small bowel obstruction, not significantly changed compared to the plain film from earlier today, also not significantly changed compared to the earlier plain film of 02/16/2016. Transition point within the mid ileum, per today's earlier CT report. 2. Lungs are clear and there is no evidence of acute cardiopulmonary abnormality. Electronically Signed   By: Bary Richard M.D.   On: 02/21/2016 18:17    EKG:   Orders placed or performed during the hospital encounter of 02/20/2016  . EKG 12-Lead  . EKG 12-Lead    ASSESSMENT AND PLAN:   BobbieMilburnis a 80 y.o.femalewho presents with Acute onset abdominal pain with coffee-ground emesis and hematochezia, also had hyperglycemia, afib rvr and mental status changes  #1 Acute GI bleed - likely upper and lower both- gastritis and diverticulitis versus due to SMA occlusion  - stopped now, hb stable. 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 Gilda Crease, appreciate general surgery input, continue conservative management with NG tube, following KUBs- continue continuous suction,  keep her NPO. Continue  Dulcolax suppositories, unfortunately, minimal  rectal output. Continue TPN and heparin intravenous drip.   #3 .Atrial fibrillation with RVR (HCC), now off oral Cardizem and metoprolol, continue patient on Cardizem IV when necessary, metoprolol IV around-the-clock, continue amiodarone IV drip , digoxin , following digoxin level intermittently, appreciate cardiology consult, echocardiogram done on this admission was normal, except mild valvular abnormalities-Patient was initially managed on no anticoagulation due to GI bleed (pt was on eliquis in the past-she stopped it several months ago), however, due to concerns of SMA occlusion, she is initiated on heparin IV drip at present, likely transitioning to Eliquis  prior to discharge home  #4 UncontrolledDiabetes [facility])  Due to medication non compliance on admission -NPO currently, continue insulin lantus BID as sugars elevated - a1c 11.9, on SSI, blood glucose levels are ranging between 100-200, continue insulin Lantus at 18 units twice a day,  advanced due to initiation of parenteral nutrition  #5 ARF- ATN due to bowel ischemia, small bowel obstruction, SMA occlusion, creatinine has improved on  IV fluids, continue parenteral nutrition, stable kidney function , urinary output closely. kidneys on the CT abd without any acute findings - avoid nephrotoxins, monitor  #6 Hypokalemia- replaced. Magnesium level normal  #6. DVT Prophylaxis- TEDs and SCDs Intravenous heparin    #7. Generalized weakness, Physical Therapy consulted,  SNF at discharge Discharge plans once small bowel obstruction resolves, hopefully later this week if small bowel obstruction resolves    All the records are reviewed and case discussed with Care Management/Social Workerr. Management plans discussed with the patient, family and they are in agreement.  CODE STATUS: Full Code  TOTAL TIME TAKING CARE OF THIS PATIENT:  35 minutes.       POSSIBLE D/C IN 3 DAYS, DEPENDING ON CLINICAL CONDITION.   Katharina Caper M.D on  02/23/2016 at 2:57  PM  Between 7am to 6pm - Pager - [phone]  After 6pm go to [email or web address] - Social research officer, government  [facility]  Office  [phone]  CC: Primary care physician; No PCP Per Patient

## 2016-02-23 NOTE — Progress Notes (Signed)
Physical Therapy Treatment Patient Details Name: [name] CarrelBobbie L Beck MRN: [id number] DOB: 08/03/1935 Today's Date: 02/23/2016    History of Present Illness Pt is a 80 y/o female presenting with acute onset abdominal pain with coffee ground emesis, nausea, and hematochezia. Pt admitted for GI bleed, and A-fib with RVR. Pt had a rapid response on 02/14/16 due to being unable to speak or move extremities and trasnferred to ICU. Pt on 02/17/16 had NGT placed due to having an ileus with distended stomach. PMH of A-fib, DM, HTN, and chronic kidney disease with essential HTN per cardiology report.     PT Comments    Pt. Supine in bed upon arrival, awake, oriented and agreeable to activity. Pt. Able to perform multiple bouts of UE and LE exercises, able to tolerate resistance with some exercises (knee extension) while requiring AAROM for other exercises (SLR). Pt. Becomes fatigued with small bouts of activity requiring resting breaks between exercises. HR remained stable throughout session with max HR 111bpm. Current plan for SNF upon d/c to follow up with PT needs remains appropriate. Will continue to progress mobility as pt. Is able.   Follow Up Recommendations  SNF     Equipment Recommendations  Rolling walker with 5" wheels    Recommendations for Other Services       Precautions / Restrictions Precautions Precautions: Fall Restrictions Weight Bearing Restrictions: No    Mobility  Bed Mobility Overal bed mobility:  (not performed due to pt. tolerance)                Transfers Overall transfer level:  (deferred at this time)                  Ambulation/Gait Ambulation/Gait assistance:  (deferred at this time)               Stairs            Wheelchair Mobility    Modified Rankin (Stroke Patients Only)       Balance Overall balance assessment:  (not assessed today )                                  Cognition Arousal/Alertness:  Awake/alert Behavior During Therapy: WFL for tasks assessed/performed Overall Cognitive Status: Within Functional Limits for tasks assessed                      Exercises Other Exercises Other Exercises: Supine LE exercises for AROM/strengthening x10 B; ankle pumps, resisted knee extension, hip abd/add, hip add squeeze, heel slides, SAQ,  SLR (AAROM), resisted hip abd, small bridges  Other Exercises: supine UE exercises for AROM/strengthening x10B; PNF patterns, resisted bicep curls, resisted tricep extensions.    General Comments        Pertinent Vitals/Pain Pain Assessment: No/denies pain    Home Living                      Prior Function            PT Goals (current goals can now be found in the care plan section) Acute Rehab PT Goals Patient Stated Goal: To go home.  PT Goal Formulation: With patient Time For Goal Achievement: 03/03/16 Potential to Achieve Goals: Fair Progress towards PT goals: Progressing toward goals    Frequency    Min 2X/week      PT Plan Current plan remains appropriate  Co-evaluation             End of Session Equipment Utilized During Treatment: Oxygen Activity Tolerance: Patient limited by fatigue;Patient tolerated treatment well (Pt. agreeable to all activity, becomes fatigued with small bouts of activity) Patient left: in bed;with call bell/phone within reach;with bed alarm set;with SCD's reapplied     Time: 1610-9604 PT Time Calculation (min) (ACUTE ONLY): 18 min  Charges:                       G Codes:      Huntley Dec, SPT  03/21/16,3:11 PM

## 2016-02-23 NOTE — Progress Notes (Signed)
PARENTERAL NUTRITION CONSULT NOTE - FOLLOW UP  Pharmacy Consult for TPN monitoring Indication: NPO  Allergies  Allergen Reactions  . Demerol [Meperidine] Nausea And Vomiting  . Reglan [Metoclopramide] Swelling  . Penicillins Swelling and Rash    Swelling to arm at injection site. Has patient had a PCN reaction causing immediate rash, facial/tongue/throat swelling, SOB or lightheadedness with hypotension: Yes Has patient had a PCN reaction causing severe rash involving mucus membranes or skin necrosis: No Has patient had a PCN reaction that required hospitalization No Has patient had a PCN reaction occurring within the last 10 years: No If all of the above answers are "NO", then may proceed with Cephalosporin use.     Patient Measurements: Height: 5\' 7"  (170.2 cm) Weight: (!) 305 lb 6.4 oz (138.5 kg) IBW/kg (Calculated) : 61.6 Adjusted Body Weight: 92.36 kg Usual Weight:    Vital Signs: Temp: 97.9 F (36.6 C) (10/02 1203) Temp Source: Oral (10/02 1203) BP: 137/58 (10/02 1203) Pulse Rate: 92 (10/02 1203) Intake/Output from previous day: 10/01 0701 - 10/02 0700 In: 1327.8 [I.V.:1027.8; IV Piggyback:300] Out: 1250 [Urine:1250] Intake/Output from this shift: Total I/O In: 1340.4 [P.O.:30; I.V.:1310.4] Out: 275 [Emesis/NG output:275]  Labs:  Recent Labs  02/21/16 0444 02/21/16 1155 02-27-16 0430 02/23/16 0515  WBC 9.7  --  8.7 9.5  HGB 11.0*  --  10.7* 10.6*  HCT 34.2*  --  33.3* 31.4*  PLT 209  --  222 218  APTT  --  29  --   --   INR  --  1.18  --   --      Recent Labs  02/21/16 0444 2016-02-27 0430 27-Feb-2016 1221 02/23/16 0515  NA 139 141  --  139  K 3.2* 4.6  --  3.6  CL 99* 109  --  104  CO2 30 28  --  29  GLUCOSE 418* 143*  --  184*  BUN 36* 27*  --  22*  CREATININE 0.97 0.88  --  0.96  CALCIUM 6.8* 6.5*  --  6.7*  MG  --   --   --  1.8  PHOS  --   --   --  3.8  PROT  --   --  5.3*  --   ALBUMIN  --   --  1.5* 1.4*  AST  --   --  12*  --   ALT   --   --  8*  --   ALKPHOS  --   --  49  --   BILITOT  --   --  0.8  --   BILIDIR  --   --  0.2  --   IBILI  --   --  0.6  --   TRIG  --   --   --  81   Estimated Creatinine Clearance: 70.4 mL/min (by C-G formula based on SCr of 0.96 mg/dL).    Recent Labs  02/23/16 0421 02/23/16 0745 02/23/16 1204  GLUCAP 167* 198* 194*    Medications:  Scheduled:  . bisacodyl  10 mg Rectal Daily  . ciprofloxacin  400 mg Intravenous BID  . digoxin  0.125 mg Intravenous Daily  . digoxin  0.5 mg Intravenous Once  . insulin aspart  0-15 Units Subcutaneous Q4H  . insulin glargine  16 Units Subcutaneous BID  . mouth rinse  15 mL Mouth Rinse BID  . metoprolol  5 mg Intravenous Q6H  . metronidazole  500 mg Intravenous Q8H  .  pantoprazole (PROTONIX) IV  40 mg Intravenous Q12H  . senna-docusate  1 tablet Oral BID  . sodium chloride flush  3 mL Intravenous Q12H   Infusions:  . Marland Kitchen.TPN (CLINIMIX-E) Adult 35 mL/hr at 03/18/2016 1844  . 0.9 % NaCl with KCl 20 mEq / L 30 mL/hr at 03/20/2016 2101  . amiodarone 30 mg/hr (02/23/16 0056)  . fat emulsion 500 mL (02/23/16 0400)  . Marland Kitchen.TPN (CLINIMIX-E) Adult     And  . fat emulsion    . heparin 1,650 Units/hr (02/23/16 1035)    Insulin Requirements in the past 24 hours:  11 units of Novoloq SS and Lantus 16 units BID  Current Nutrition:  Clinimix 5/15 E at 6335ml/hr and Lipids 20% at 1015ml/hr  Assessment: Patient has tolerated TPN well and will advance to goal rate of 10670ml/hr at 18:00. Electrolytes are within range.   Nutritional Goals:  1912 kCal, 84 grams of protein per day  Plan:  Will advance TPN to goal rate of 4570ml/hr. Will follow up on electrolytes and insulin needs.  Clovia CuffLisa Jeffey Janssen, PharmD, BCPS 02/23/2016 2:15 PM

## 2016-02-23 NOTE — Progress Notes (Signed)
Nutrition Follow-up  DOCUMENTATION CODES:   Morbid obesity  INTERVENTION:  -Continue Clinimix E 5/15 @ 2935mL/hr, will increase to goal 8670mL/hr today @ 1800 -Continue 20% Fat emulsion @ 3715mL/hr -Provides 1912 calories, 84gm protein  NUTRITION DIAGNOSIS:   Inadequate oral intake related to altered GI function as evidenced by per patient/family report. -ongoing  GOAL:   Patient will meet greater than or equal to 90% of their needs -progressing with TPN  MONITOR:   Labs, Weight trends, I & O's, PO intake, Diet advancement  ASSESSMENT:   [name]  is a 80 y.o. female who presents with Acute onset abdominal pain with coffee-ground emesis and hematochezia starting today just after lunch time. Here in the ED her blood pressure stable, hemoglobin is stable, she has persistent abdominal pain and nausea.  Continues on TPN, will reach goal rate today NGT output decreasing ~500cc Seen by vascular surgery yesterday - embolization of SMA found. No new wts Labs and medications reviewed: CBGs 167-198 NS w/ K+ @ 7330mL/hr Amiodarone drip Heparin drip  Diet Order:  .TPN (CLINIMIX-E) Adult Diet NPO time specified  Skin:  Reviewed, no issues  Last BM:  929  Height:   Ht Readings from Last 1 Encounters:  02/16/16 5\' 7"  (1.702 m)    Weight:   Wt Readings from Last 1 Encounters:  02/16/16 (!) 305 lb 6.4 oz (138.5 kg)    Ideal Body Weight:  61.36 kg  BMI:  Body mass index is 47.83 kg/m.  Estimated Nutritional Needs:   Kcal:  1800-2200 calories  Protein:  60-80 gm  Fluid:  >/= 1.8L  EDUCATION NEEDS:   No education needs identified at this time  Dionne AnoWilliam M. Emery Dupuy, MS, RD LDN Inpatient Clinical Dietitian Pager [phone]

## 2016-02-23 NOTE — Care Management (Signed)
Barrier- occlusion of portion of sma.  angiogram.  TPN.  Still has nasogastric tube

## 2016-02-23 NOTE — Progress Notes (Signed)
ANTICOAGULATION CONSULT NOTE - Follow Up Consult  Pharmacy Consult for Heparin Indication: ? SMA occlusion  Allergies  Allergen Reactions  . Demerol [Meperidine] Nausea And Vomiting  . Reglan [Metoclopramide] Swelling  . Penicillins Swelling and Rash    Swelling to arm at injection site. Has patient had a PCN reaction causing immediate rash, facial/tongue/throat swelling, SOB or lightheadedness with hypotension: Yes Has patient had a PCN reaction causing severe rash involving mucus membranes or skin necrosis: No Has patient had a PCN reaction that required hospitalization No Has patient had a PCN reaction occurring within the last 10 years: No If all of the above answers are "NO", then may proceed with Cephalosporin use.     Patient Measurements: Height: 5\' 7"  (170.2 cm) Weight: (!) 305 lb 6.4 oz (138.5 kg) IBW/kg (Calculated) : 61.6 Heparin Dosing Weight: 95.5 kg  Vital Signs: Temp: 98.1 F (36.7 C) (10/02 0423) Temp Source: Oral (10/02 0423) BP: 135/56 (10/02 0423) Pulse Rate: 97 (10/02 0423)  Labs:  Recent Labs  02/21/16 0444 02/21/16 1155 01/04/16 0430 01/04/16 2243 02/23/16 0515 02/23/16 0942  HGB 11.0*  --  10.7*  --  10.6*  --   HCT 34.2*  --  33.3*  --  31.4*  --   PLT 209  --  222  --  218  --   APTT  --  29  --   --   --   --   LABPROT  --  15.1  --   --   --   --   INR  --  1.18  --   --   --   --   HEPARINUNFRC  --   --   --  0.32  --  0.13*  CREATININE 0.97  --  0.88  --  0.96  --     Estimated Creatinine Clearance: 70.4 mL/min (by C-G formula based on SCr of 0.96 mg/dL).   Medications:  Scheduled:  . bisacodyl  10 mg Rectal Daily  . ciprofloxacin  400 mg Intravenous BID  . digoxin  0.125 mg Intravenous Daily  . digoxin  0.5 mg Intravenous Once  . heparin  2,800 Units Intravenous Once  . insulin aspart  0-15 Units Subcutaneous Q4H  . insulin glargine  16 Units Subcutaneous BID  . metoprolol  5 mg Intravenous Q6H  . metronidazole  500 mg  Intravenous Q8H  . pantoprazole (PROTONIX) IV  40 mg Intravenous Q12H  . senna-docusate  1 tablet Oral BID  . sodium chloride flush  3 mL Intravenous Q12H   Infusions:  . Marland Kitchen.TPN (CLINIMIX-E) Adult 35 mL/hr at 01/04/16 1844  . 0.9 % NaCl with KCl 20 mEq / L 30 mL/hr at 01/04/16 2101  . amiodarone 30 mg/hr (02/23/16 0056)  . fat emulsion 500 mL (02/23/16 0400)  . heparin 1,350 Units/hr (01/04/16 1702)    Assessment: Pharmacy consulted to initiate heparin drip for 80 yo female being treated for possible SMA occulusion. Patient admitted with GI bleed. Patient with Afib not on chronic anticoagulation due to GI bleeds.  10/2 09:30 Heparin level resulted at 0.13  Goal of Therapy:  Heparin level 0.3-0.7 units/ml Monitor platelets by anticoagulation protocol: Yes   Plan:  Will give Heparin 2800 units IV bolus and increase infusion rate to 1650 units/hr. Will check next Heparin level in 8 hr.  Clovia CuffLisa Antolin Belsito, PharmD, BCPS 02/23/2016 10:29 AM

## 2016-02-23 NOTE — Progress Notes (Signed)
[facility]  Daily Progress Note   Subjective  - 1 Day Post-Op  Patient states that for a while she was pain-free but over the past hour or so she is once again developed abdominal pain. Her NG tube has been clamped and when I checked with the nurse she is still receiving oral medications and therefore the tube requires to be clamped after these medications are being given.  Objective Vitals:   02/23/16 0032 02/23/16 0423 02/23/16 1201 02/23/16 1203  BP: 121/61 (!) 135/56  (!) 137/58  Pulse: 86 97 (!) 108 92  Resp:  18  18  Temp:  98.1 F (36.7 C)  97.9 F (36.6 C)  TempSrc:  Oral  Oral  SpO2:  100% (!) 82% 98%  Weight:      Height:        Intake/Output Summary (Last 24 hours) at 02/23/16 1526 Last data filed at 02/23/16 0832  Gross per 24 hour  Intake          2668.19 ml  Output              525 ml  Net          2143.19 ml    PULM  Normal effort , no use of accessory muscles CV  No JVD, RRR Abd      Moderately distended, tender to palpation VASC  femoral pulses 2+ bilaterally  Laboratory CBC    Component Value Date/Time   WBC 9.5 02/23/2016 0515   HGB 10.6 (L) 02/23/2016 0515   HGB 11.4 (L) 07/10/2013 0418   HCT 31.4 (L) 02/23/2016 0515   HCT 34.1 (L) 07/10/2013 0418   PLT 218 02/23/2016 0515   PLT 301 07/10/2013 0418    BMET    Component Value Date/Time   NA 139 02/23/2016 0515   NA 142 07/10/2013 0418   K 3.6 02/23/2016 0515   K 4.5 07/10/2013 0418   CL 104 02/23/2016 0515   CL 113 (H) 07/10/2013 0418   CO2 29 02/23/2016 0515   CO2 23 07/10/2013 0418   GLUCOSE 184 (H) 02/23/2016 0515   GLUCOSE 149 (H) 07/10/2013 0418   BUN 22 (H) 02/23/2016 0515   BUN 12 07/10/2013 0418   CREATININE 0.96 02/23/2016 0515   CREATININE 1.18 07/10/2013 0418   CALCIUM 6.7 (L) 02/23/2016 0515   CALCIUM 8.6 07/10/2013 0418   GFRNONAA 55 (L) 02/23/2016 0515   GFRNONAA 45 (L) 07/10/2013 0418   GFRAA >60 02/23/2016 0515   GFRAA 52 (L) 07/10/2013  0418    Assessment/Planning: POD #1 s/p mechanical thrombectomy with intervention of the superior mesenteric artery secondary to embolization   Continued supportive care. I would recommend converting all medications to parenteral so that her NG suctioning can been maintained continuously. Gen. surgery will continue to follow her laboratory values will be monitored as will her vital signs. At this point there do not appear to be any shifts in the parameters that would suggest worsening of her ischemic bowel injury. I will continue to follow. Heparin drip will be maintained for now.    Levora DredgeGregory Isley Weisheit  02/23/2016, 3:26 PM

## 2016-02-23 NOTE — Progress Notes (Signed)
80 yr old female with SMA occlusion POD# 1 from SMA thrombectomy.  Patient states that her pain has been much improved and she has had some intermittent pain but nowhere near the same intensity or duration. Patient states that the pain is crampy and all over however it is not continuous not as bad. Patient states that the pain medicines that were given to the pain away. She has continued to be on the heparin drip and her NG tube to suction.  Vitals:   02/23/16 1203 02/23/16 1736  BP: (!) 137/58 132/65  Pulse: 92 83  Resp: 18   Temp: 97.9 F (36.6 C)    I/O last 3 completed shifts: In: 4173.1 [P.O.:30; I.V.:3443.1; IV Piggyback:700] Out: 1650 [Urine:1250; Emesis/NG output:400] No intake/output data recorded.   PE: Gen NAD Res: CTAB/L  CardIo: RRR Abd: soft, non-distended, currently non-tender Ext: no edema  CBC Latest Ref Rng & Units 02/23/2016 03/05/2016 02/21/2016  WBC 3.6 - 11.0 K/uL 9.5 8.7 9.7  Hemoglobin 12.0 - 16.0 g/dL 10.6(L) 10.7(L) 11.0(L)  Hematocrit 35.0 - 47.0 % 31.4(L) 33.3(L) 34.2(L)  Platelets 150 - 440 K/uL 218 222 209   CMP Latest Ref Rng & Units 02/23/2016 03/03/2016 02/21/2016  Glucose 65 - 99 mg/dL 161(W184(H) 960(A143(H) 540(J418(H)  BUN 6 - 20 mg/dL 81(X22(H) 91(Y27(H) 78(G36(H)  Creatinine 0.44 - 1.00 mg/dL 9.560.96 2.130.88 0.860.97  Sodium 135 - 145 mmol/L 139 141 139  Potassium 3.5 - 5.1 mmol/L 3.6 4.6 3.2(L)  Chloride 101 - 111 mmol/L 104 109 99(L)  CO2 22 - 32 mmol/L 29 28 30   Calcium 8.9 - 10.3 mg/dL 6.7(L) 6.5(L) 6.8(L)  Total Protein 6.5 - 8.1 g/dL - 5.3(L) -  Total Bilirubin 0.3 - 1.2 mg/dL - 0.8 -  Alkaline Phos 38 - 126 U/L - 49 -  AST 15 - 41 U/L - 12(L) -  ALT 14 - 54 U/L - 8(L) -    A/P:  80 yr old female with SMA occlusion POD# 1 from SMA thrombectomy.  Patient with minimal pain after SMA thrombectomy. Patient does not appear to be having any reperfusion syndrome or reocclusion at this time given her minimal intermittent pain. She is to continue her heparin and her NG tube at  this time. We will continue to assess for either reperfusion syndrome or reocclusion with abdominal exams. Also with any questions or concerns

## 2016-02-23 NOTE — Progress Notes (Deleted)
Physical Therapy Treatment Patient Details Name: [name] CarrelBobbie L Beck MRN: [id number] DOB: 03/11/1936 Today's Date: 02/23/2016    History of Present Illness Pt is a 80 y/o female presenting with acute onset abdominal pain with coffee ground emesis, nausea, and hematochezia. Pt admitted for GI bleed, and A-fib with RVR. Pt had a rapid response on 02/14/16 due to being unable to speak or move extremities and trasnferred to ICU. Pt on 02/17/16 had NGT placed due to having an ileus with distended stomach. PMH of A-fib, DM, HTN, and chronic kidney disease with essential HTN per cardiology report.     PT Comments    Pt. Supine in bed upon arrival, awake, oriented and agreeable to activity. Pt. Able to perform multiple bouts of UE and LE exercises, able to tolerate resistance with some exercises (knee extension) while requiring AAROM for other exercises (SLR). Pt. Becomes fatigued with small bouts of activity requiring resting breaks between exercises. HR remained stable throughout session with max HR 111bpm. Current plan for SNF upon d/c to follow up with PT needs remains appropriate. Will continue to progress mobility as pt. Is able.   Follow Up Recommendations  SNF     Equipment Recommendations  Rolling walker with 5" wheels    Recommendations for Other Services       Precautions / Restrictions Precautions Precautions: Fall Restrictions Weight Bearing Restrictions: No    Mobility  Bed Mobility Overal bed mobility:  (not performed due to pt. tolerance)                Transfers Overall transfer level:  (deferred at this time)                  Ambulation/Gait Ambulation/Gait assistance:  (deferred at this time)               Stairs            Wheelchair Mobility    Modified Rankin (Stroke Patients Only)       Balance Overall balance assessment:  (not assessed today )                                  Cognition Arousal/Alertness:  Awake/alert Behavior During Therapy: WFL for tasks assessed/performed Overall Cognitive Status: Within Functional Limits for tasks assessed                      Exercises Other Exercises Other Exercises: Supine LE exercises for AROM/strengthening x10 B; ankle pumps, resisted knee extension, hip abd/add, hip add squeeze, heel slides, SAQ,  SLR (AAROM), resisted hip abd, small bridges  Other Exercises: supine UE exercises for AROM/strengthening x10B; PNF patterns, resisted bicep curls, resisted tricep extensions.    General Comments        Pertinent Vitals/Pain Pain Assessment: No/denies pain    Home Living                      Prior Function            PT Goals (current goals can now be found in the care plan section) Acute Rehab PT Goals Patient Stated Goal: To go home.  PT Goal Formulation: With patient Time For Goal Achievement: 03/03/16 Potential to Achieve Goals: Fair Progress towards PT goals: Progressing toward goals    Frequency    Min 2X/week      PT Plan Current plan remains appropriate  Co-evaluation             End of Session Equipment Utilized During Treatment: Oxygen Activity Tolerance: Patient limited by fatigue;Patient tolerated treatment well (Pt. agreeable to all activity, becomes fatigued with small bouts of activity) Patient left: in bed;with call bell/phone within reach;with bed alarm set;with SCD's reapplied     Time: 1610-9604 PT Time Calculation (min) (ACUTE ONLY): 18 min  Charges:  $Therapeutic Exercise: 8-22 mins                    G Codes:       Brynna Dobos, SPT  02/23/16,3:09 PM

## 2016-02-23 NOTE — Progress Notes (Signed)
ANTICOAGULATION CONSULT NOTE - Initial Consult  Pharmacy Consult for heparin drip  Indication: ?SMA Occlusion   Assessment: 10/1 2243 heparin level therapeutic.   Goal of Therapy:  Heparin level 0.3-0.7 units/ml Monitor platelets by anticoagulation protocol: Yes   Plan:  Continue current rate. Will recheck HL in 8 hours.   Allergies  Allergen Reactions  . Demerol [Meperidine] Nausea And Vomiting  . Reglan [Metoclopramide] Swelling  . Penicillins Swelling and Rash    Swelling to arm at injection site. Has patient had a PCN reaction causing immediate rash, facial/tongue/throat swelling, SOB or lightheadedness with hypotension: Yes Has patient had a PCN reaction causing severe rash involving mucus membranes or skin necrosis: No Has patient had a PCN reaction that required hospitalization No Has patient had a PCN reaction occurring within the last 10 years: No If all of the above answers are "NO", then may proceed with Cephalosporin use.     Patient Measurements: Height: 5\' 7"  (170.2 cm) Weight: (!) 305 lb 6.4 oz (138.5 kg) IBW/kg (Calculated) : 61.6 Heparin Dosing Weight: 95.5kg    Vital Signs: Temp: 98.2 F (36.8 C) (10/01 1934) BP: 127/75 (10/01 2355) Pulse Rate: 78 (10/01 2355)  Labs:  Recent Labs  02/20/16 0444 02/21/16 0444 02/21/16 1155 03/18/2016 0430 02/25/2016 2243  HGB  --  11.0*  --  10.7*  --   HCT  --  34.2*  --  33.3*  --   PLT  --  209  --  222  --   APTT  --   --  29  --   --   LABPROT  --   --  15.1  --   --   INR  --   --  1.18  --   --   HEPARINUNFRC  --   --   --   --  0.32  CREATININE 0.95 0.97  --  0.88  --     Estimated Creatinine Clearance: 76.9 mL/min (by C-G formula based on SCr of 0.88 mg/dL).   Medical History: Past Medical History:  Diagnosis Date  . Atrial fibrillation (HCC)   . Diabetes mellitus without complication (HCC)   . Hypertension    Pharmacy will continue to monitor and adjust per consult.   Carola FrostNathan A Biddie Sebek,  Pharm.D., BCPS Clinical Pharmacist 02/23/2016,12:31 AM

## 2016-02-23 NOTE — Progress Notes (Signed)
Pt not able to tolerate CPAP due to NG tube in her nose. No distress noted.

## 2016-02-23 NOTE — Clinical Social Work Note (Signed)
Clinical Social Work Assessment  Patient Details  Name: [name] MRN: [id number] Date of Birth: 08/10/1935  Date of referral:  02/23/16               Reason for consult:  Discharge Planning                Permission sought to share information with:    Permission granted to share information::     Name::        Agency::     Relationship::     Contact Information:     Housing/Transportation Living arrangements for the past 2 months:  Single Family Home Source of Information:  Patient Patient Interpreter Needed:  None Criminal Activity/Legal Involvement Pertinent to Current Situation/Hospitalization:  No - Comment as needed Significant Relationships:  Adult Children Lives with:  Self Do you feel safe going back to the place where you live?  Yes Need for family participation in patient care:  No (Coment)  Care giving concerns: PT recommends that patient would benefit from SNF placement.    Social Worker assessment / plan:  CSW met with patient at beside. Introduced herself and her role. Per patient she lives with her son. Reported that she's not interested in SNF placement. Stated that she would like to return home at discharge. Reported that she's open to HH services coming into her home. Patient reported that she did not have any questions for CSW. CSW is signing off but is available if a CSW need were to arise.   Employment status:  Retired Insurance information:  Managed Medicare PT Recommendations:  Skilled Nursing Facility Information / Referral to community resources:  Skilled Nursing Facility  Patient/Family's Response to care:  Patient declined SNF placement.   Patient/Family's Understanding of and Emotional Response to Diagnosis, Current Treatment, and Prognosis:  Patient reported she understood. Thanked CSW for assistance.   Emotional Assessment Appearance:  Appears stated age Attitude/Demeanor/Rapport:   (None) Affect (typically observed):  Calm,  Pleasant Orientation:  Oriented to Self, Oriented to Place, Oriented to  Time, Oriented to Situation Alcohol / Substance use:  Not Applicable Psych involvement (Current and /or in the community):  No (Comment)  Discharge Needs  Concerns to be addressed:  Discharge Planning Concerns Readmission within the last 30 days:  No Current discharge risk:  Chronically ill Barriers to Discharge:  Continued Medical Work up   Christina E Sunkins, LCSW 02/23/2016, 4:06 PM  

## 2016-02-24 ENCOUNTER — Encounter: Payer: Self-pay | Admitting: Vascular Surgery

## 2016-02-24 LAB — GLUCOSE, CAPILLARY
GLUCOSE-CAPILLARY: 279 mg/dL — AB (ref 65–99)
GLUCOSE-CAPILLARY: 241 mg/dL — AB (ref 65–99)
GLUCOSE-CAPILLARY: 221 mg/dL — AB (ref 65–99)
Glucose-Capillary: 318 mg/dL — ABNORMAL HIGH (ref 65–99)
Glucose-Capillary: 294 mg/dL — ABNORMAL HIGH (ref 65–99)
Glucose-Capillary: 205 mg/dL — ABNORMAL HIGH (ref 65–99)

## 2016-02-24 LAB — BASIC METABOLIC PANEL
Anion gap: 5 (ref 5–15)
BUN: 23 mg/dL — AB (ref 6–20)
CALCIUM: 6.6 mg/dL — AB (ref 8.9–10.3)
CHLORIDE: 105 mmol/L (ref 101–111)
CO2: 27 mmol/L (ref 22–32)
CREATININE: 0.97 mg/dL (ref 0.44–1.00)
GFR, EST NON AFRICAN AMERICAN: 55 mL/min — AB (ref >60)
Glucose, Bld: 300 mg/dL — ABNORMAL HIGH (ref 65–99)
Potassium: 3.5 mmol/L (ref 3.5–5.1)
SODIUM: 137 mmol/L (ref 135–145)

## 2016-02-24 LAB — CBC
HCT: 29 % — ABNORMAL LOW (ref 35.0–47.0)
HEMOGLOBIN: 9.6 g/dL — AB (ref 12.0–16.0)
MCH: 30.4 pg (ref 26.0–34.0)
MCHC: 33.3 g/dL (ref 32.0–36.0)
MCV: 91.4 fL (ref 80.0–100.0)
PLATELETS: 221 10*3/uL (ref 150–440)
RBC: 3.17 MIL/uL — ABNORMAL LOW (ref 3.80–5.20)
RDW: 14.6 % — AB (ref 11.5–14.5)
WBC: 7.7 10*3/uL (ref 3.6–11.0)

## 2016-02-24 LAB — MAGNESIUM: MAGNESIUM: 1.8 mg/dL (ref 1.7–2.4)

## 2016-02-24 LAB — HEPARIN LEVEL (UNFRACTIONATED): Heparin Unfractionated: 0.42 IU/mL (ref 0.30–0.70)

## 2016-02-24 LAB — PHOSPHORUS: PHOSPHORUS: 3.1 mg/dL (ref 2.5–4.6)

## 2016-02-24 MED ORDER — INSULIN GLARGINE 100 UNIT/ML ~~LOC~~ SOLN: 23.0000 [IU] | Freq: Every day | SUBCUTANEOUS | Status: DC

## 2016-02-24 MED ORDER — TRACE MINERALS CR-CU-MN-SE-ZN 10-1000-500-60 MCG/ML IV SOLN
INTRAVENOUS | Status: AC
  Administered 2016-02-24: 18:00:00 via INTRAVENOUS
  Filled 2016-02-24: qty 1680

## 2016-02-24 MED ORDER — KETOROLAC TROMETHAMINE 30 MG/ML IJ SOLN
30.0000 mg | Freq: Four times a day (QID) | INTRAMUSCULAR | Status: DC | PRN
  Administered 2016-02-25 – 2016-02-27 (×7): 30 mg via INTRAVENOUS
  Filled 2016-02-24 (×7): qty 1

## 2016-02-24 MED ORDER — FAT EMULSION 20 % IV EMUL
500.0000 mL | INTRAVENOUS | Status: AC
  Administered 2016-02-24: 500 mL via INTRAVENOUS
  Filled 2016-02-24: qty 500

## 2016-02-24 MED ORDER — POTASSIUM CHLORIDE 10 MEQ/100ML IV SOLN
10.0000 meq | INTRAVENOUS | Status: AC
  Administered 2016-02-24 (×2): 10 meq via INTRAVENOUS
  Filled 2016-02-24 (×2): qty 100

## 2016-02-24 MED ORDER — INSULIN GLARGINE 100 UNIT/ML ~~LOC~~ SOLN
27.0000 [IU] | Freq: Every day | SUBCUTANEOUS | Status: DC
  Administered 2016-02-25: 27 [IU] via SUBCUTANEOUS
  Filled 2016-02-24 (×3): qty 0.27

## 2016-02-24 MED ORDER — CEPASTAT 14.5 MG MT LOZG
1.0000 | LOZENGE | OROMUCOSAL | Status: DC | PRN
  Administered 2016-02-24 – 2016-02-26 (×2): 1 via BUCCAL
  Filled 2016-02-24 (×2): qty 9

## 2016-02-24 MED ORDER — INSULIN GLARGINE 100 UNIT/ML ~~LOC~~ SOLN
23.0000 [IU] | Freq: Two times a day (BID) | SUBCUTANEOUS | Status: DC
  Administered 2016-02-24: 23 [IU] via SUBCUTANEOUS
  Filled 2016-02-24 (×3): qty 0.23

## 2016-02-24 MED ORDER — MAGNESIUM SULFATE IN D5W 1-5 GM/100ML-% IV SOLN
1.0000 g | Freq: Once | INTRAVENOUS | Status: AC
  Administered 2016-02-24: 1 g via INTRAVENOUS
  Filled 2016-02-24: qty 100

## 2016-02-24 NOTE — Progress Notes (Signed)
[name]  Daily Progress Note   Subjective  - 2 Days Post-Op  The patient reports today she has had minimal abdominal pain. She has not taken any pain medication since early this morning. She denies any spontaneous flatus.  Objective Vitals:   02/24/16 0431 02/24/16 0800 02/24/16 1145 02/24/16 1249  BP:  (!) 144/61  122/65  Pulse: 78 96 (!) 146 86  Resp:  20  20  Temp:    97.6 F (36.4 C)  TempSrc:    Oral  SpO2: 96% 97% 90% 94%  Weight:      Height:        Intake/Output Summary (Last 24 hours) at 02/24/16 1745 Last data filed at 02/24/16 1633  Gross per 24 hour  Intake          3053.91 ml  Output             1175 ml  Net          1878.91 ml    PULM  Normal effort , no use of accessory muscles CV  No JVD, RRR Abd      No distended, nontender VASC  Right groin clean dry and intact no hematoma  Laboratory CBC    Component Value Date/Time   WBC 7.7 02/24/2016 0420   HGB 9.6 (L) 02/24/2016 0420   HGB 11.4 (L) 07/10/2013 0418   HCT 29.0 (L) 02/24/2016 0420   HCT 34.1 (L) 07/10/2013 0418   PLT 221 02/24/2016 0420   PLT 301 07/10/2013 0418    BMET    Component Value Date/Time   NA 137 02/24/2016 0420   NA 142 07/10/2013 0418   K 3.5 02/24/2016 0420   K 4.5 07/10/2013 0418   CL 105 02/24/2016 0420   CL 113 (H) 07/10/2013 0418   CO2 27 02/24/2016 0420   CO2 23 07/10/2013 0418   GLUCOSE 300 (H) 02/24/2016 0420   GLUCOSE 149 (H) 07/10/2013 0418   BUN 23 (H) 02/24/2016 0420   BUN 12 07/10/2013 0418   CREATININE 0.97 02/24/2016 0420   CREATININE 1.18 07/10/2013 0418   CALCIUM 6.6 (L) 02/24/2016 0420   CALCIUM 8.6 07/10/2013 0418   GFRNONAA 55 (L) 02/24/2016 0420   GFRNONAA 45 (L) 07/10/2013 0418   GFRAA >60 02/24/2016 0420   GFRAA 52 (L) 07/10/2013 0418    Assessment/Planning: POD #2 s/p thrombectomy with angioplasty of the superior mesenteric artery  The patient is now improving both subjectively and clinically. Her white  count remains normal her electrolytes remain normal. Her NG aspirate is decreasing. I suspect that her ileus is slowly resolving and at this point doubt that she has any severe intestinal damage.  I expect her normal bowel function to return but this will be fairly slow over the course of several more days most likely. I would continue heparin until bowel function is returned and then Plavix and aspirin can be instituted.  Levora DredgeGregory Esthela Brandner  02/24/2016, 5:45 PM

## 2016-02-24 NOTE — Progress Notes (Signed)
80 yr old female with SMA occlusion POD# 2 from SMA thrombectomy.  [name] states that her pain has been much improved and she has had some intermittent pain but nowhere near the same intensity or duration. [name] is concerned about having to have operation and explained that at this time she does not have any indication for operation, but she does have an ileus.  She does state that she feels some gas moving but no flatus yet  Vitals:   02/24/16 0348 02/24/16 0431  BP: (!) 133/50   Pulse: 81 78  Resp: 18   Temp: 98.1 F (36.7 C)    I/O last 3 completed shifts: In: 5702.6 [P.O.:30; I.V.:4972.6; IV Piggyback:700] Out: 1350 [Urine:800; Emesis/NG output:550] No intake/output data recorded.   PE: Gen NAD Res: CTAB/L  CardIo: RRR Abd: soft, moderately distended, non tender to palpation Ext: no edema  CBC Latest Ref Rng & Units 02/24/2016 02/23/2016 02/28/2016  WBC 3.6 - 11.0 K/uL 7.7 9.5 8.7  Hemoglobin 12.0 - 16.0 g/dL 4.0(J9.6(L) 10.6(L) 10.7(L)  Hematocrit 35.0 - 47.0 % 29.0(L) 31.4(L) 33.3(L)  Platelets 150 - 440 K/uL 221 218 222   CMP Latest Ref Rng & Units 02/24/2016 02/23/2016 02/24/2016  Glucose 65 - 99 mg/dL 811(B300(H) 147(W184(H) 295(A143(H)  BUN 6 - 20 mg/dL 21(H23(H) 08(M22(H) 57(Q27(H)  Creatinine 0.44 - 1.00 mg/dL 4.690.97 6.290.96 5.280.88  Sodium 135 - 145 mmol/L 137 139 141  Potassium 3.5 - 5.1 mmol/L 3.5 3.6 4.6  Chloride 101 - 111 mmol/L 105 104 109  CO2 22 - 32 mmol/L 27 29 28   Calcium 8.9 - 10.3 mg/dL 6.6(L) 6.7(L) 6.5(L)  Total Protein 6.5 - 8.1 g/dL - - 5.3(L)  Total Bilirubin 0.3 - 1.2 mg/dL - - 0.8  Alkaline Phos 38 - 126 U/L - - 49  AST 15 - 41 U/L - - 12(L)  ALT 14 - 54 U/L - - 8(L)    A/P:  80 yr old female with SMA occlusion POD# 2 from SMA thrombectomy.  [name] with minimal intermittent pain, crampy in nature since procedure.  Patients labs are all improving as well, with normal WBC.  She does have an ileus which is not unusual for this pathology.  Keep NG tube to suction on medium since  output is thick, she may need some flushes as well.  I would discontinue all the PO meds possible so NG tube is to suction.  She need ambulation in hallways and to sit up in chair. Will change pain meds to toradol prns from morphine to avoid any further decreased bowel function.

## 2016-02-24 NOTE — Progress Notes (Addendum)
PARENTERAL NUTRITION CONSULT NOTE - FOLLOW UP  Pharmacy Consult for TPN monitoring Indication: NPO  Allergies  Allergen Reactions  . Demerol [Meperidine] Nausea And Vomiting  . Reglan [Metoclopramide] Swelling  . Penicillins Swelling and Rash    Swelling to arm at injection site. Has patient had a PCN reaction causing immediate rash, facial/tongue/throat swelling, SOB or lightheadedness with hypotension: Yes Has patient had a PCN reaction causing severe rash involving mucus membranes or skin necrosis: No Has patient had a PCN reaction that required hospitalization No Has patient had a PCN reaction occurring within the last 10 years: No If all of the above answers are "NO", then may proceed with Cephalosporin use.     Patient Measurements: Height: 5\' 7"  (170.2 cm) Weight: (!) 305 lb 6.4 oz (138.5 kg) IBW/kg (Calculated) : 61.6 Adjusted Body Weight: 92.36 kg  Vital Signs: Temp: 97.6 F (36.4 C) (10/03 1249) Temp Source: Oral (10/03 1249) BP: 122/65 (10/03 1249) Pulse Rate: 86 (10/03 1249) Intake/Output from previous day: 10/02 0701 - 10/03 0700 In: 4374.9 [P.O.:30; I.V.:3944.9; IV Piggyback:400] Out: 1100 [Urine:550; Emesis/NG output:550] Intake/Output from this shift: Total I/O In: 905.7 [I.V.:505.7; IV Piggyback:400] Out: 175 [Emesis/NG output:175]  Labs:  Recent Labs  03/03/2016 0430 02/23/16 0515 02/24/16 0420  WBC 8.7 9.5 7.7  HGB 10.7* 10.6* 9.6*  HCT 33.3* 31.4* 29.0*  PLT 222 218 221     Recent Labs  03/14/2016 0430 03/18/2016 1221 02/23/16 0515 02/24/16 0420  NA 141  --  139 137  K 4.6  --  3.6 3.5  CL 109  --  104 105  CO2 28  --  29 27  GLUCOSE 143*  --  184* 300*  BUN 27*  --  22* 23*  CREATININE 0.88  --  0.96 0.97  CALCIUM 6.5*  --  6.7* 6.6*  MG  --   --  1.8 1.8  PHOS  --   --  3.8 3.1  PROT  --  5.3*  --   --   ALBUMIN  --  1.5* 1.4*  --   AST  --  12*  --   --   ALT  --  8*  --   --   ALKPHOS  --  49  --   --   BILITOT  --  0.8  --    --   BILIDIR  --  0.2  --   --   IBILI  --  0.6  --   --   TRIG  --   --  81  --    Estimated Creatinine Clearance: 69.7 mL/min (by C-G formula based on SCr of 0.97 mg/dL).    Recent Labs  02/24/16 0354 02/24/16 0734 02/24/16 1226  GLUCAP 318* 294* 279*    Medications:  Scheduled:  . bisacodyl  10 mg Rectal Daily  . ciprofloxacin  400 mg Intravenous BID  . [START ON 02/25/2016] digoxin  0.125 mg Intravenous Q48H  . digoxin  0.5 mg Intravenous Once  . insulin aspart  0-15 Units Subcutaneous Q4H  . [START ON 02/25/2016] insulin glargine  27 Units Subcutaneous Daily  . mouth rinse  15 mL Mouth Rinse BID  . metoprolol  5 mg Intravenous Q6H  . metronidazole  500 mg Intravenous Q8H  . pantoprazole (PROTONIX) IV  40 mg Intravenous Q12H  . sodium chloride flush  3 mL Intravenous Q12H   Infusions:  . 0.9 % NaCl with KCl 20 mEq / L 30 mL/hr at  02/24/16 0235  . amiodarone 30 mg/hr (02/24/16 0245)  . Marland KitchenTPN (CLINIMIX-E) Adult 70 mL/hr at 02/23/16 1753   And  . fat emulsion 500 mL (02/23/16 1744)  . Marland KitchenTPN (CLINIMIX-E) Adult     And  . fat emulsion    . heparin 1,650 Units/hr (02/23/16 2135)    Insulin Requirements in the past 24 hours:  Received insulin gargine 34 units yesterday (16 am, 18pm), novolog sliding scale insulin 28 untis  Current Nutrition:  Clinimix 5/15 E at63ml/hr and Lipids 20% at 23ml/hr  Assessment: TPN advanced yesterday evening, blood sugars elevated  Nutritional Goals:  1912 kCal, 84 grams of protein per day  Plan:  Continue current rate of 77ml/hr, increased glargine this morning to 23 units and changed to once daily (MD increased to 27 units to start tommorow). Will add 30 units of regular insulin to TPN. Continue SSI  Discontinued orders for NS with KCl running at 62ml/hr. K: 3.5, Mg 1.8, patient on digoxin. Will give KCl x IV x 2 and magnesium 1gm IV x 1 to target K > 4, Mg >2.0. Recheck with AM labs  Garlon Hatchet, PharmD Clinical  Pharmacist  02/24/2016 2:29 PM

## 2016-02-24 NOTE — Progress Notes (Signed)
[employer] [facility] - [place] [facility]   PATIENT NAME: [name]    MR#:  [id number]  DATE OF BIRTH:  1936-04-15  SUBJECTIVE:  CHIEF COMPLAINT:   Chief Complaint  Patient presents with  . Diarrhea  . Abdominal Pain  . Emesis   - The patient remains in A. fib, rate is 120, now on amiodarone IV drip, Cardizem, metoprolol, IV,  NGT  drainage about 1 L over the past 24 hours. Every stool output with an enema, pain with an enema, otherwise abdominal pain is not existent. Milder NG tube drainage, of about 400 ml today. No nausea. 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 Lactic acid level was normal.Patient underwent mechanical thrombectomy by Dr. Gilda Beck, PTA, clearing clot from SMA Patient denies any abdominal pain, overall feels relatively comfortable at present, however had intermittent abdominal pain in lower abdomen earlier today, improved with IV morphine. The patient continues to have significant and she output at 550 cc over the past 24 hours.  REVIEW OF SYSTEMS:  Review of Systems  Constitutional: Positive for malaise/fatigue. Negative for chills and fever.  HENT: Negative for ear discharge, ear pain and tinnitus.   Eyes: Negative for blurred vision and double vision.  Respiratory: Positive for shortness of breath. Negative for cough and wheezing.   Cardiovascular: Negative for chest pain, palpitations and leg swelling.  Gastrointestinal: Negative for abdominal pain, constipation, diarrhea, nausea and vomiting.       Abdominal distention  Genitourinary: Negative for dysuria and urgency.  Musculoskeletal: Negative for myalgias.  Neurological: Negative for dizziness, sensory change, speech change, focal weakness, seizures and headaches.  Psychiatric/Behavioral: Negative for depression.    DRUG ALLERGIES:   Allergies  Allergen Reactions  . Demerol [Meperidine] Nausea And Vomiting  . Reglan  [Metoclopramide] Swelling  . Penicillins Swelling and Rash    Swelling to arm at injection site. Has patient had a PCN reaction causing immediate rash, facial/tongue/throat swelling, SOB or lightheadedness with hypotension: Yes Has patient had a PCN reaction causing severe rash involving mucus membranes or skin necrosis: No Has patient had a PCN reaction that required hospitalization No Has patient had a PCN reaction occurring within the last 10 years: No If all of the above answers are "NO", then may proceed with Cephalosporin use.     VITALS:  Blood pressure 122/65, pulse 86, temperature 97.6 F (36.4 C), temperature source Oral, resp. rate 20, height 5\' 7"  (1.702 m), weight (!) 138.5 kg (305 lb 6.4 oz), SpO2 94 %.  PHYSICAL EXAMINATION:  Physical Exam  GENERAL:  80 y.o.-year-old patient lying in the bed in No significant distress today, more comfortable.   EYES: Pupils equal, round, reactive to light and accommodation. No scleral icterus. Extraocular muscles intact.  HEENT: Head atraumatic, normocephalic. Oropharynx and nasopharynx clear. NG tube in place with continuous suction NECK:  Supple, no jugular venous distention. No thyroid enlargement, no tenderness.  LUNGS: diminished breath sounds bilaterally at bases, no wheezing, rales,rhonchi or crepitation. No use of accessory muscles of respiration. Decreased bibasilar breath sounds CARDIOVASCULAR: S1, S1, irregular irregular, better rate control. No murmurs, rubs, or gallops.  ABDOMEN: Soft, obese, distended, non tender. Hypoactive Bowel sounds present. No organomegaly or mass. No guarding EXTREMITIES: Trace pedal edema, no cyanosis, or clubbing.  NEUROLOGIC: Cranial nerves II through XII are intact. Muscle strength 5/5 in all extremities. Sensation intact. Gait not checked.  PSYCHIATRIC: The patient is alert and oriented x 3.  SKIN: No obvious rash, lesion, or ulcer.    LABORATORY PANEL:   CBC  Recent Labs Lab 02/24/16 0420   WBC 7.7  HGB 9.6*  HCT 29.0*  PLT 221   ------------------------------------------------------------------------------------------------------------------  Chemistries   Recent Labs Lab 03/11/2016 1221  02/24/16 0420  NA  --   < > 137  K  --   < > 3.5  CL  --   < > 105  CO2  --   < > 27  GLUCOSE  --   < > 300*  BUN  --   < > 23*  CREATININE  --   < > 0.97  CALCIUM  --   < > 6.6*  MG  --   < > 1.8  AST 12*  --   --   ALT 8*  --   --   ALKPHOS 49  --   --   BILITOT 0.8  --   --   < > = values in this interval not displayed. ------------------------------------------------------------------------------------------------------------------  Cardiac Enzymes No results for input(s): TROPONINI in the last 168 hours. ------------------------------------------------------------------------------------------------------------------  RADIOLOGY:  Dg Abd Acute W/chest  Result Date: 02/23/2016 CLINICAL DATA:  Ileus. New onset abdominal pain. Small bowel obstruction. EXAM: DG ABDOMEN ACUTE W/ 1V CHEST COMPARISON:  CT abdomen and pelvis 02/23/2016 FINDINGS: The heart is enlarged.  Lung volumes are low. Multiple low dilated loops of small bowel are again seen. There is some gas within the colon NG tube is in the stomach. The stomach is decompressed. The largest small bowel loops are in the left upper quadrant measuring up to 5.8 cm. Fluid levels are present. IMPRESSION: 1. Persistent ileus versus small bowel obstruction with partial decompression of the small bowel following NG tube placement. 2. The side port of the NG tube is in the stomach. 3. Low lung volumes and mild bibasilar atelectasis. Electronically Signed   By: Marin Roberts M.D.   On: 02/23/2016 08:18    EKG:   Orders placed or performed during the hospital encounter of 02/16/2016  . EKG 12-Lead  . EKG 12-Lead    ASSESSMENT AND PLAN:   [name] a 80 y.o.femalewho presents with Acute onset abdominal pain with  coffee-ground emesis and hematochezia, also had hyperglycemia, afib rvr and mental status changes  #1 Acute GI bleed - likely upper and lower both- gastritis and diverticulitis versus due to SMA occlusion  - stopped now, hb stable. 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 Gilda Beck, appreciate general surgery input, continue conservative management with NG tube, following KUBs- continue continuous suction,  keep her NPO. Continue  Dulcolax suppositories, unfortunately, minimal rectal output. Continue TPN and heparin intravenous drip.   #3 .Atrial fibrillation with RVR (HCC), now off oral Cardizem and metoprolol, continue patient on Cardizem IV when necessary, metoprolol IV around-the-clock, continue amiodarone IV drip , digoxin , following digoxin level intermittently, appreciate cardiology consult, echocardiogram done on this admission was normal, except mild valvular abnormalities-Patient was initially managed on no anticoagulation due to GI bleed (pt was on eliquis in the past-she stopped it several months ago), however, due to concerns of SMA occlusion, she is initiated on heparin IV drip at present, likely transitioning  to Eliquis  prior to discharge home  #4 [facility])  Due to medication non compliance on admission -NPO currently, continue insulin lantus 27 units daily, advance it as needed - a1c 11.9, on SSI, blood glucose levels are ranging between 200-300, continue insulin Lantus at 27 units daily,  advanced due to initiation of parenteral nutrition  #5 ARF- ATN due to bowel ischemia, small bowel obstruction, SMA occlusion, creatinine has improved on  IV  fluids, continue parenteral nutrition, stable kidney function , urinary output closely. kidneys on the CT abd without any acute findings - avoid nephrotoxins, monitor  #6 Hypokalemia- replaced. Magnesium level normal  #6. DVT Prophylaxis- TEDs and SCDs Intravenous heparin    #7. Generalized weakness, Physical Therapy consulted,  SNF at discharge Discharge plans once small bowel obstruction resolves, hopefully later this week if small bowel obstruction resolves    All the records are reviewed and case discussed with Care Management/Social Workerr. Management plans discussed with the patient, family and they are in agreement.  CODE STATUS: Full Code  TOTAL TIME TAKING CARE OF THIS PATIENT:  35 minutes.    Discussed with Dr. Orvis BrillLoflin, general surgery   POSSIBLE D/C IN 3 DAYS, DEPENDING ON CLINICAL CONDITION.   Katharina CaperVAICKUTE,Lakeyshia Tuckerman M.D on 02/24/2016 at 1:26 PM  Between 7am to 6pm - Pager - [phone]  After 6pm go to [email or web address] - Social research officer, governmentpassword [email or web address]  [facility]  Office  [phone]  CC: Primary care physician; No PCP Per Patient

## 2016-02-24 NOTE — Plan of Care (Signed)
Problem: Activity: Goal: Risk for activity intolerance will decrease Outcome: Progressing Patient up to chair at bedside today for a couple hours. Tolerated well.

## 2016-02-24 NOTE — Progress Notes (Signed)
Physical Therapy Treatment Patient Details Name: [name] CarrelBobbie L Beck MRN: [id number] DOB: 10/27/1935 Today's Date: 02/24/2016    History of Present Illness Pt is a 80 y/o female presenting with acute onset abdominal pain with coffee ground emesis, nausea, and hematochezia. Pt admitted for GI bleed, and A-fib with RVR. Pt had a rapid response on 02/14/16 due to being unable to speak or move extremities and trasnferred to ICU. Pt on 02/17/16 had NGT placed due to having an ileus with distended stomach. S/P SMA thrombectomy 03/02/2016. PMH of A-fib, DM, HTN, and chronic kidney disease with essential HTN per cardiology report.     PT Comments    At this time pt able to complete rolling bed mobility with bed rail use supervision only and supine to sit +1 min assist. Pt able to transfer sit to stand +2 min assist and bed to chair +2 mod assist using RW . Orthostatics taken this session with lying BP 117/49, sitting BP 132/71, and standing BP 98/81 with quick return once seated to 138/65. HR was monitored throughout session with 91 bpm at rest; 114-134 bpm and with transfer activity HR peaked at 146 bpm and returned to 104-126 bpm seated. Pt educated at end of session about level of assist required to return home and pt reports she will discuss with family about potentially going to STR. Pt will benefit from continued PT in order to improve activity tolerance and functional mobility.    Follow Up Recommendations  SNF     Equipment Recommendations  Rolling walker with 5" wheels    Recommendations for Other Services       Precautions / Restrictions Precautions Precautions: Fall Restrictions Weight Bearing Restrictions: No    Mobility  Bed Mobility Overal bed mobility: Needs Assistance Bed Mobility: Rolling;Supine to Sit Rolling: Supervision (min vc's for hand placement; heavy bedrail use)   Supine to sit: Min assist (+1 max assist to scoot to EOB once sitting)     General bed mobility comments:  increased SOB with rolling onto/off of bed pan; Pt became fatigued sitting for approximately 6-7 minutes; HR between 114-134 bpm seated  Transfers Overall transfer level: Needs assistance Equipment used: Rolling walker (2 wheeled) Transfers: Sit to/from Stand Sit to Stand: Min assist;+2 physical assistance         General transfer comment: pt required min vc's to stand up straight  Ambulation/Gait Ambulation/Gait assistance: Mod assist;+2 physical assistance Ambulation Distance (Feet): 2 Feet Assistive device: Rolling walker (2 wheeled)   Gait velocity: decreased   General Gait Details: Pt able to take a few short steps to transfer bed to chair; pt reports increased dizziness and weakness in B LE's (improved quickly with sitting)   Stairs            Wheelchair Mobility    Modified Rankin (Stroke Patients Only)       Balance Overall balance assessment: Needs assistance Sitting-balance support: Feet unsupported;Single extremity supported Sitting balance-Leahy Scale: Fair Sitting balance - Comments: +1 min guard for safety only     Standing balance-Leahy Scale: Poor Standing balance comment: +2 mod assist to mainain standing/dynamic balance                    Cognition Arousal/Alertness: Awake/alert Behavior During Therapy: WFL for tasks assessed/performed Overall Cognitive Status: Within Functional Limits for tasks assessed                      Exercises  General Comments General comments (skin integrity, edema, etc.): Pt is agreeable to PT session.       Pertinent Vitals/Pain Pain Assessment: No/denies pain    Home Living                      Prior Function            PT Goals (current goals can now be found in the care plan section) Acute Rehab PT Goals Patient Stated Goal: To go home.  PT Goal Formulation: With patient Time For Goal Achievement: 03/03/16 Potential to Achieve Goals: Fair Progress towards PT goals:  Progressing toward goals    Frequency    Min 2X/week      PT Plan Current plan remains appropriate    Co-evaluation             End of Session Equipment Utilized During Treatment: Gait belt;Oxygen Activity Tolerance: Patient limited by fatigue Patient left: in chair;with call bell/phone within reach;with chair alarm set     Time: 1129-1205 PT Time Calculation (min) (ACUTE ONLY): 36 min  Charges:                       G Codes:      Albertina Senegal, SPT 02/24/2016, 1:00 PM

## 2016-02-24 NOTE — Progress Notes (Signed)
ANTICOAGULATION CONSULT NOTE - Follow Up Consult  Pharmacy Consult for Heparin Indication: ? SMA occlusion  Allergies  Allergen Reactions  . Demerol [Meperidine] Nausea And Vomiting  . Reglan [Metoclopramide] Swelling  . Penicillins Swelling and Rash    Swelling to arm at injection site. Has patient had a PCN reaction causing immediate rash, facial/tongue/throat swelling, SOB or lightheadedness with hypotension: Yes Has patient had a PCN reaction causing severe rash involving mucus membranes or skin necrosis: No Has patient had a PCN reaction that required hospitalization No Has patient had a PCN reaction occurring within the last 10 years: No If all of the above answers are "NO", then may proceed with Cephalosporin use.     Patient Measurements: Height: 5\' 7"  (170.2 cm) Weight: (!) 305 lb 6.4 oz (138.5 kg) IBW/kg (Calculated) : 61.6 Heparin Dosing Weight: 95.5 kg  Vital Signs: Temp: 98.1 F (36.7 C) (10/03 0348) Temp Source: Oral (10/03 0348) BP: 133/50 (10/03 0348) Pulse Rate: 78 (10/03 0431)  Labs:  Recent Labs  02/21/16 1155  Jul 11, 2015 0430  02/23/16 0515 02/23/16 0942 02/23/16 1911 02/24/16 0420  HGB  --   < > 10.7*  --  10.6*  --   --  9.6*  HCT  --   --  33.3*  --  31.4*  --   --  29.0*  PLT  --   --  222  --  218  --   --  221  APTT 29  --   --   --   --   --   --   --   LABPROT 15.1  --   --   --   --   --   --   --   INR 1.18  --   --   --   --   --   --   --   HEPARINUNFRC  --   --   --   < >  --  0.13* 0.34 0.42  CREATININE  --   --  0.88  --  0.96  --   --   --   < > = values in this interval not displayed.  Estimated Creatinine Clearance: 70.4 mL/min (by C-G formula based on SCr of 0.96 mg/dL).   Medications:  Scheduled:  . bisacodyl  10 mg Rectal Daily  . ciprofloxacin  400 mg Intravenous BID  . [START ON 02/25/2016] digoxin  0.125 mg Intravenous Q48H  . digoxin  0.5 mg Intravenous Once  . insulin aspart  0-15 Units Subcutaneous Q4H  .  insulin glargine  18 Units Subcutaneous BID  . mouth rinse  15 mL Mouth Rinse BID  . metoprolol  5 mg Intravenous Q6H  . metronidazole  500 mg Intravenous Q8H  . pantoprazole (PROTONIX) IV  40 mg Intravenous Q12H  . senna-docusate  1 tablet Oral BID  . sodium chloride flush  3 mL Intravenous Q12H   Infusions:  . 0.9 % NaCl with KCl 20 mEq / L 30 mL/hr at 02/24/16 0235  . amiodarone 30 mg/hr (02/24/16 0245)  . Marland Kitchen.TPN (CLINIMIX-E) Adult 70 mL/hr at 02/23/16 1753   And  . fat emulsion 500 mL (02/23/16 1744)  . heparin 1,650 Units/hr (02/23/16 2135)    Assessment: Pharmacy consulted to initiate heparin drip for 80 yo female being treated for possible SMA occulusion. Patient admitted with GI bleed. Patient with Afib not on chronic anticoagulation due to GI bleeds.  10/2 09:30 Heparin level resulted at 0.13  10/2 1911 HL 0.34  Goal of Therapy:  Heparin level 0.3-0.7 units/ml Monitor platelets by anticoagulation protocol: Yes   Plan:  Heparin level therapeutic. Will recheck level with AM labs.   10/3 AM heparin level 0.42. Continue current regimen. Recheck heparin level and CBC with tomorrow AM labs.  Cher Nakai, PharmD Clinical Pharmacist 02/24/2016 5:11 AM

## 2016-02-24 NOTE — Progress Notes (Signed)
Pt. Slept through the night with c/o pain x1, medicated. Heparin and Amio continues. of green fluid drained into suction canister. No signs or c/o SOB or acute distress noted. Will continue to monitor.

## 2016-02-24 NOTE — Progress Notes (Signed)
Order Clarification:  Paged MD concerning Ketorolac order of 30mg  IV q6h prn. Recommended dosing in patient > 78 yo would be 15 mg IV q6h prn. Per MD continue Ketorolac as ordered  Bari MantisKristin Warden Buffa PharmD Clinical Pharmacist 02/24/2016

## 2016-02-24 NOTE — Care Management (Signed)
Patient continues to desire to go home with home health rather than skilled nursing.  Discussed the need  to make every effort to push and exert self and be out of bed and increase efforts each day.  Remains on amiodarone drip. Heart rate under better control.  Continues with nasogastric tube due to ileus.

## 2016-02-24 NOTE — Progress Notes (Signed)
Initial Nutrition Assessment  DOCUMENTATION CODES:   Morbid obesity  INTERVENTION:  Continue Clinimix E 5/15 @ 6470mL/hr Continue 20% Lipids @ 2815mL/hr Provides 1912 calories, 84gm protein  NUTRITION DIAGNOSIS:   Inadequate oral intake related to altered GI function as evidenced by per patient/family report. -ongoing  GOAL:   Patient will meet greater than or equal to 90% of their needs -meeting  MONITOR:   Labs, Weight trends, I & O's, PO intake, Diet advancement  REASON FOR ASSESSMENT:   Consult New TPN/TNA  ASSESSMENT:   [name]  is a 80 y.o. female who presents with Acute onset abdominal pain with coffee-ground emesis and hematochezia starting today just after lunch time. Here in the ED her blood pressure stable, hemoglobin is stable, she has persistent abdominal pain and nausea.  She is feeling ok today. Still has 700cc thick, dark brown suction from NGT Denies nausea/vomiting Labs and medications reviewed: CBGs 294-318, Ca 6.6 NS w/ K+ @ 8030mL/hr Amiodarine drip  Diet Order:  Diet NPO time specified TPN (CLINIMIX-E) Adult  Skin:  Reviewed, no issues  Last BM:  929  Height:   Ht Readings from Last 1 Encounters:  02/16/16 5\' 7"  (1.702 m)    Weight:   Wt Readings from Last 1 Encounters:  02/16/16 (!) 305 lb 6.4 oz (138.5 kg)    Ideal Body Weight:  61.36 kg  BMI:  Body mass index is 47.83 kg/m.  Estimated Nutritional Needs:   Kcal:  1800-2200 calories  Protein:  60-80 gm  Fluid:  >/= 1.8L  EDUCATION NEEDS:   No education needs identified at this time  Dionne AnoWilliam M. Haliey Romberg, MS, RD LDN Inpatient Clinical Dietitian Pager [phone]

## 2016-02-25 ENCOUNTER — Inpatient Hospital Stay: Payer: Commercial Managed Care - HMO

## 2016-02-25 LAB — GLUCOSE, CAPILLARY
GLUCOSE-CAPILLARY: 227 mg/dL — AB (ref 65–99)
GLUCOSE-CAPILLARY: 221 mg/dL — AB (ref 65–99)
GLUCOSE-CAPILLARY: 188 mg/dL — AB (ref 65–99)
Glucose-Capillary: 236 mg/dL — ABNORMAL HIGH (ref 65–99)
Glucose-Capillary: 225 mg/dL — ABNORMAL HIGH (ref 65–99)

## 2016-02-25 LAB — BASIC METABOLIC PANEL
Anion gap: 5 (ref 5–15)
BUN: 24 mg/dL — AB (ref 6–20)
CALCIUM: 6.9 mg/dL — AB (ref 8.9–10.3)
CHLORIDE: 104 mmol/L (ref 101–111)
CO2: 26 mmol/L (ref 22–32)
CREATININE: 1 mg/dL (ref 0.44–1.00)
GFR calc non Af Amer: 53 mL/min — ABNORMAL LOW (ref >60)
Glucose, Bld: 223 mg/dL — ABNORMAL HIGH (ref 65–99)
Potassium: 3.3 mmol/L — ABNORMAL LOW (ref 3.5–5.1)
SODIUM: 135 mmol/L (ref 135–145)

## 2016-02-25 LAB — CBC
HEMATOCRIT: 30.6 % — AB (ref 35.0–47.0)
HEMOGLOBIN: 9.4 g/dL — AB (ref 12.0–16.0)
MCH: 36.1 pg — AB (ref 26.0–34.0)
MCHC: 30.8 g/dL — ABNORMAL LOW (ref 32.0–36.0)
MCV: 117.3 fL — AB (ref 80.0–100.0)
PLATELETS: 191 10*3/uL (ref 150–440)
RBC: 2.61 MIL/uL — AB (ref 3.80–5.20)
RDW: 16.3 % — ABNORMAL HIGH (ref 11.5–14.5)
WBC: 6.4 10*3/uL (ref 3.6–11.0)

## 2016-02-25 LAB — HEMOGLOBIN: Hemoglobin: 10 g/dL — ABNORMAL LOW (ref 12.0–16.0)

## 2016-02-25 LAB — PHOSPHORUS: PHOSPHORUS: 3.5 mg/dL (ref 2.5–4.6)

## 2016-02-25 LAB — HEPARIN LEVEL (UNFRACTIONATED)
HEPARIN UNFRACTIONATED: 0.32 [IU]/mL (ref 0.30–0.70)
Heparin Unfractionated: <0.1 IU/mL — ABNORMAL LOW (ref 0.30–0.70)
Heparin Unfractionated: <0.1 IU/mL — ABNORMAL LOW (ref 0.30–0.70)

## 2016-02-25 LAB — POTASSIUM: Potassium: 3.8 mmol/L (ref 3.5–5.1)

## 2016-02-25 LAB — MAGNESIUM: MAGNESIUM: 1.8 mg/dL (ref 1.7–2.4)

## 2016-02-25 MED ORDER — FAT EMULSION 20 % IV EMUL
500.0000 mL | INTRAVENOUS | Status: AC
  Administered 2016-02-25: 500 mL via INTRAVENOUS
  Filled 2016-02-25: qty 500

## 2016-02-25 MED ORDER — POTASSIUM CHLORIDE 10 MEQ/100ML IV SOLN
10.0000 meq | INTRAVENOUS | Status: AC
  Administered 2016-02-25 (×4): 10 meq via INTRAVENOUS
  Filled 2016-02-25 (×4): qty 100

## 2016-02-25 MED ORDER — HEPARIN BOLUS VIA INFUSION
2800.0000 [IU] | Freq: Once | INTRAVENOUS | Status: AC
  Administered 2016-02-25: 2800 [IU] via INTRAVENOUS
  Filled 2016-02-25: qty 2800

## 2016-02-25 MED ORDER — TRACE MINERALS CR-CU-MN-SE-ZN 10-1000-500-60 MCG/ML IV SOLN
INTRAVENOUS | Status: AC
  Administered 2016-02-25: 19:00:00 via INTRAVENOUS
  Filled 2016-02-25: qty 1680

## 2016-02-25 MED ORDER — INSULIN ASPART 100 UNIT/ML ~~LOC~~ SOLN
0.0000 [IU] | SUBCUTANEOUS | Status: DC
  Administered 2016-02-25: 7 [IU] via SUBCUTANEOUS
  Administered 2016-02-25 – 2016-02-26 (×2): 4 [IU] via SUBCUTANEOUS
  Administered 2016-02-26: 7 [IU] via SUBCUTANEOUS
  Administered 2016-02-26 (×4): 4 [IU] via SUBCUTANEOUS
  Administered 2016-02-27: 3 [IU] via SUBCUTANEOUS
  Administered 2016-02-27: 4 [IU] via SUBCUTANEOUS
  Administered 2016-02-27: 3 [IU] via SUBCUTANEOUS
  Administered 2016-02-27: 4 [IU] via SUBCUTANEOUS
  Administered 2016-02-27: 7 [IU] via SUBCUTANEOUS
  Administered 2016-02-27: 4 [IU] via SUBCUTANEOUS
  Administered 2016-02-28: 7 [IU] via SUBCUTANEOUS
  Administered 2016-02-28 (×2): 4 [IU] via SUBCUTANEOUS
  Administered 2016-02-28 – 2016-02-29 (×5): 7 [IU] via SUBCUTANEOUS
  Administered 2016-02-29: 4 [IU] via SUBCUTANEOUS
  Filled 2016-02-25 (×2): qty 4
  Filled 2016-02-25 (×2): qty 7
  Filled 2016-02-25: qty 4
  Filled 2016-02-25: qty 7
  Filled 2016-02-25 (×3): qty 4
  Filled 2016-02-25 (×3): qty 7
  Filled 2016-02-25 (×2): qty 4
  Filled 2016-02-25: qty 3
  Filled 2016-02-25: qty 4
  Filled 2016-02-25 (×2): qty 7
  Filled 2016-02-25: qty 4
  Filled 2016-02-25: qty 3
  Filled 2016-02-25: qty 7
  Filled 2016-02-25 (×2): qty 4
  Filled 2016-02-25: qty 6

## 2016-02-25 MED ORDER — INSULIN ASPART 100 UNIT/ML ~~LOC~~ SOLN: 0.0000 [IU] | SUBCUTANEOUS | Status: DC

## 2016-02-25 MED ORDER — MAGNESIUM SULFATE IN D5W 1-5 GM/100ML-% IV SOLN
1.0000 g | Freq: Once | INTRAVENOUS | Status: AC
  Administered 2016-02-25: 1 g via INTRAVENOUS
  Filled 2016-02-25: qty 100

## 2016-02-25 NOTE — Progress Notes (Signed)
ANTICOAGULATION CONSULT NOTE - Follow Up Consult  Pharmacy Consult for Heparin Indication: ? SMA occlusion  Allergies  Allergen Reactions  . Demerol [Meperidine] Nausea And Vomiting  . Reglan [Metoclopramide] Swelling  . Penicillins Swelling and Rash    Swelling to arm at injection site. Has patient had a PCN reaction causing immediate rash, facial/tongue/throat swelling, SOB or lightheadedness with hypotension: Yes Has patient had a PCN reaction causing severe rash involving mucus membranes or skin necrosis: No Has patient had a PCN reaction that required hospitalization No Has patient had a PCN reaction occurring within the last 10 years: No If all of the above answers are "NO", then may proceed with Cephalosporin use.     Patient Measurements: Height: 5\' 7"  (170.2 cm) Weight: (!) 305 lb 6.4 oz (138.5 kg) IBW/kg (Calculated) : 61.6 Heparin Dosing Weight: 95.5 kg  Vital Signs: Temp: 97.6 F (36.4 C) (10/04 0335) Temp Source: Oral (10/04 0335) BP: 103/78 (10/04 0847) Pulse Rate: 117 (10/04 0847)  Labs:  Recent Labs  02/23/16 0515  02/24/16 0420 02/25/16 0352 02/25/16 0631 02/25/16 0850  HGB 10.6*  --  9.6* 9.4*  --   --   HCT 31.4*  --  29.0* 30.6*  --   --   PLT 218  --  221 191  --   --   HEPARINUNFRC  --   < > 0.42 SPECIMEN GROSSLY LIPEMIC, UNABLE TO RESULT DUE TO INTERFERING SUBSTANCES <0.10*  --   CREATININE 0.96  --  0.97  --   --  1.00  < > = values in this interval not displayed.  Estimated Creatinine Clearance: 67.6 mL/min (by C-G formula based on SCr of 1 mg/dL).   Medications:  Scheduled:  . bisacodyl  10 mg Rectal Daily  . ciprofloxacin  400 mg Intravenous BID  . digoxin  0.125 mg Intravenous Q48H  . digoxin  0.5 mg Intravenous Once  . insulin aspart  0-15 Units Subcutaneous Q4H  . insulin glargine  27 Units Subcutaneous Daily  . magnesium sulfate 1 - 4 g bolus IVPB  1 g Intravenous Once  . mouth rinse  15 mL Mouth Rinse BID  . metoprolol  5 mg  Intravenous Q6H  . metronidazole  500 mg Intravenous Q8H  . pantoprazole (PROTONIX) IV  40 mg Intravenous Q12H  . potassium chloride  10 mEq Intravenous Q1 Hr x 4  . sodium chloride flush  3 mL Intravenous Q12H   Infusions:  . amiodarone 30 mg/hr (02/25/16 1037)  . Marland Kitchen.TPN (CLINIMIX-E) Adult 70 mL/hr at 02/24/16 1800   And  . fat emulsion 500 mL (02/24/16 1759)  . heparin 1,650 Units/hr (02/24/16 1608)    Assessment: Pharmacy consulted to initiate heparin drip for 80 yo female being treated for possible SMA occulusion. Patient admitted with GI bleed. Patient with Afib not on chronic anticoagulation due to GI bleeds.  Goal of Therapy:  Heparin level 0.3-0.7 units/ml Monitor platelets by anticoagulation protocol: Yes   Plan:  Current orders for heparin 1650 units/hr. Heparin therapeutic x 2 yesterday but below 0.10 this morning. Repeated test this AM, final results not back but lab tells me it is < 0.10.  Will give heparin bolus 2,800 units x1 in increase drip to 1950 units/hr.   Follow closely as patient with history of GIB on anticoagulation. Will recheck hemoglobin with HL in 8 hours.  Garlon HatchetJody Ayme Short, PharmD Clinical Pharmacist  02/25/2016 10:50 AM

## 2016-02-25 NOTE — Progress Notes (Signed)
02/25/2016  Subjective: Patient continues with NG tube and no bowel function.  No significant abdominal pain at the moment.  NG tube working well, no nausea.  Vital signs: Temp:  [97.6 F (36.4 C)-97.8 F (36.6 C)] 97.8 F (36.6 C) (10/04 2027) Pulse Rate:  [94-117] 96 (10/04 2027) Resp:  [18-20] 20 (10/04 1145) BP: (103-135)/(39-78) 135/62 (10/04 1145) SpO2:  [93 %-99 %] 99 % (10/04 2027)   Intake/Output: 10/03 0701 - 10/04 0700 In: 1524.4 [I.V.:824.4; IV Piggyback:700] Out: 1075 [Urine:500; Emesis/NG output:575] Last BM Date: 02/11/2016  Physical Exam: Constitutional:  No acute distress Cardiac:  Regular rhythm and rate Pulm:  No respiratory distress, lungs are clear Abdomen:soft, distended, tympanitic on upper abdomen, non-tender.  Labs:   Recent Labs  02/24/16 0420 02/25/16 0352 02/25/16 1938  WBC 7.7 6.4  --   HGB 9.6* 9.4* 10.0*  HCT 29.0* 30.6*  --   PLT 221 191  --     Recent Labs  02/24/16 0420 02/25/16 0850 02/25/16 1938  NA 137 135  --   K 3.5 3.3* 3.8  CL 105 104  --   CO2 27 26  --   GLUCOSE 300* 223*  --   BUN 23* 24*  --   CREATININE 0.97 1.00  --   CALCIUM 6.6* 6.9*  --    No results for input(s): LABPROT, INR in the last 72 hours.  Imaging: Dg Abd 1 View  Result Date: 02/25/2016 CLINICAL DATA:  Small-bowel obstruction.  Abdominal pain. EXAM: ABDOMEN - 1 VIEW COMPARISON:  02/23/2016.  CT 2016/01/04. FINDINGS: NG tube noted coiled in the upper portion of the stomach. Persistent prominently distended loops of small bowel with a paucity colonic gas noted. Although adynamic ileus could present this fashion, findings suggest small bowel obstruction. No acute bony abnormality. Degenerative changes lumbar spine and both hips. Pelvic calcifications consistent with phleboliths . IMPRESSION: NG tube noted coiled in the upper portion stomach. Persistent prominently dilated loops of small bowel noted. There is a paucity of intra colonic air noted on today's  examination. Although adynamic ileus could present in this fashion. These findings are suggestive of small-bowel obstruction. No free air . Electronically Signed   By: Maisie Fushomas  Register   On: 02/25/2016 07:55    Assessment/Plan: 80 yo female s/p thrombectomy of SMA, with post-op ileus.  No peritoneal signs.  -Continue NG tube to wall suction -Awaiting return of bowel function and conservative management. -Continue TPN for nutrition. -No acute surgical need at this time. -Encouraged ambulation with assistance.   Howie IllJose Luis Krish Bailly, MD [facility]

## 2016-02-25 NOTE — Progress Notes (Signed)
ANTICOAGULATION CONSULT NOTE - Follow Up Consult  Pharmacy Consult for Heparin Indication: ? SMA occlusion  Allergies  Allergen Reactions  . Demerol [Meperidine] Nausea And Vomiting  . Reglan [Metoclopramide] Swelling  . Penicillins Swelling and Rash    Swelling to arm at injection site. Has patient had a PCN reaction causing immediate rash, facial/tongue/throat swelling, SOB or lightheadedness with hypotension: Yes Has patient had a PCN reaction causing severe rash involving mucus membranes or skin necrosis: No Has patient had a PCN reaction that required hospitalization No Has patient had a PCN reaction occurring within the last 10 years: No If all of the above answers are "NO", then may proceed with Cephalosporin use.     Patient Measurements: Height: 5\' 7"  (170.2 cm) Weight: (!) 305 lb 6.4 oz (138.5 kg) IBW/kg (Calculated) : 61.6 Heparin Dosing Weight: 95.5 kg  Vital Signs: Temp: 97.6 F (36.4 C) (10/04 0335) Temp Source: Oral (10/04 0335) BP: 125/39 (10/04 0335) Pulse Rate: 94 (10/04 0335)  Labs:  Recent Labs  02/23/16 0515  02/23/16 1911 02/24/16 0420 02/25/16 0352  HGB 10.6*  --   --  9.6* 9.4*  HCT 31.4*  --   --  29.0* 30.6*  PLT 218  --   --  221 191  HEPARINUNFRC  --   < > 0.34 0.42 SPECIMEN GROSSLY LIPEMIC, UNABLE TO RESULT DUE TO INTERFERING SUBSTANCES  CREATININE 0.96  --   --  0.97  --   < > = values in this interval not displayed.  Estimated Creatinine Clearance: 69.7 mL/min (by C-G formula based on SCr of 0.97 mg/dL).   Medications:  Scheduled:  . bisacodyl  10 mg Rectal Daily  . ciprofloxacin  400 mg Intravenous BID  . digoxin  0.125 mg Intravenous Q48H  . digoxin  0.5 mg Intravenous Once  . insulin aspart  0-15 Units Subcutaneous Q4H  . insulin glargine  27 Units Subcutaneous Daily  . mouth rinse  15 mL Mouth Rinse BID  . metoprolol  5 mg Intravenous Q6H  . metronidazole  500 mg Intravenous Q8H  . pantoprazole (PROTONIX) IV  40 mg  Intravenous Q12H  . sodium chloride flush  3 mL Intravenous Q12H   Infusions:  . amiodarone 30 mg/hr (02/25/16 0331)  . Marland Kitchen.TPN (CLINIMIX-E) Adult 70 mL/hr at 02/24/16 1800   And  . fat emulsion 500 mL (02/24/16 1759)  . heparin 1,650 Units/hr (02/24/16 1608)    Assessment: Pharmacy consulted to initiate heparin drip for 80 yo female being treated for possible SMA occulusion. Patient admitted with GI bleed. Patient with Afib not on chronic anticoagulation due to GI bleeds.  10/2 09:30 Heparin level resulted at 0.13  10/2 1911 HL 0.34  Goal of Therapy:  Heparin level 0.3-0.7 units/ml Monitor platelets by anticoagulation protocol: Yes   Plan:  Heparin level therapeutic. Will recheck level with AM labs.   10/3 AM heparin level 0.42. Continue current regimen. Recheck heparin level and CBC with tomorrow AM labs.  10/4 AM heparin specimen unreadable due to lipemia. Patient is a PICC draw with TPN/lipids infusion through same line.  Redraw ordered with RN asked to pause lipid infusion before drawing heparin level.  Cher NakaiSheema Hallaji, PharmD Clinical Pharmacist 02/25/2016 6:28 AM

## 2016-02-25 NOTE — Progress Notes (Signed)
PARENTERAL NUTRITION CONSULT NOTE - FOLLOW UP  Pharmacy Consult for TPN monitoring Indication: NPO  Allergies  Allergen Reactions  . Demerol [Meperidine] Nausea And Vomiting  . Reglan [Metoclopramide] Swelling  . Penicillins Swelling and Rash    Swelling to arm at injection site. Has patient had a PCN reaction causing immediate rash, facial/tongue/throat swelling, SOB or lightheadedness with hypotension: Yes Has patient had a PCN reaction causing severe rash involving mucus membranes or skin necrosis: No Has patient had a PCN reaction that required hospitalization No Has patient had a PCN reaction occurring within the last 10 years: No If all of the above answers are "NO", then may proceed with Cephalosporin use.     Patient Measurements: Height: 5\' 7"  (170.2 cm) Weight: (!) 305 lb 6.4 oz (138.5 kg) IBW/kg (Calculated) : 61.6 Adjusted Body Weight: 92.36 kg  Vital Signs: Temp: 97.8 F (36.6 C) (10/04 2027) BP: 135/62 (10/04 1145) Pulse Rate: 96 (10/04 2027) Intake/Output from previous day: 10/03 0701 - 10/04 0700 In: 1524.4 [I.V.:824.4; IV Piggyback:700] Out: 1075 [Urine:500; Emesis/NG output:575] Intake/Output from this shift: No intake/output data recorded.  Labs:  Recent Labs  02/23/16 0515 02/24/16 0420 02/25/16 0352 02/25/16 1938  WBC 9.5 7.7 6.4  --   HGB 10.6* 9.6* 9.4* 10.0*  HCT 31.4* 29.0* 30.6*  --   PLT 218 221 191  --      Recent Labs  02/23/16 0515 02/24/16 0420 02/25/16 0850 02/25/16 1938  NA 139 137 135  --   K 3.6 3.5 3.3* 3.8  CL 104 105 104  --   CO2 29 27 26   --   GLUCOSE 184* 300* 223*  --   BUN 22* 23* 24*  --   CREATININE 0.96 0.97 1.00  --   CALCIUM 6.7* 6.6* 6.9*  --   MG 1.8 1.8 1.8  --   PHOS 3.8 3.1 3.5  --   ALBUMIN 1.4*  --   --   --   TRIG 81  --   --   --    Estimated Creatinine Clearance: 67.6 mL/min (by C-G formula based on SCr of 1 mg/dL).    Recent Labs  02/25/16 0814 02/25/16 1140 02/25/16 1722  GLUCAP  227* 225* 221*    Medications:  Scheduled:  . bisacodyl  10 mg Rectal Daily  . ciprofloxacin  400 mg Intravenous BID  . digoxin  0.125 mg Intravenous Q48H  . digoxin  0.5 mg Intravenous Once  . insulin aspart  0-20 Units Subcutaneous Q4H  . insulin glargine  27 Units Subcutaneous Daily  . mouth rinse  15 mL Mouth Rinse BID  . metoprolol  5 mg Intravenous Q6H  . metronidazole  500 mg Intravenous Q8H  . pantoprazole (PROTONIX) IV  40 mg Intravenous Q12H  . sodium chloride flush  3 mL Intravenous Q12H   Infusions:  . amiodarone 30 mg/hr (02/25/16 1037)  . Marland KitchenTPN (CLINIMIX-E) Adult 70 mL/hr at 02/25/16 1849   And  . fat emulsion 500 mL (02/25/16 1849)  . heparin 1,950 Units/hr (02/25/16 1103)    Insulin Requirements in the past 24 hours:  Insulin glargine 23 units yesterday AM, SSI 42 units, 30 units of insulin added to TPN  Current Nutrition:  Clinimix 5/15 E at53ml/hr and Lipids 20% at 53ml/hr  Assessment: TPN advanced yesterday evening, blood sugars elevated  Nutritional Goals:  1912 kCal, 84 grams of protein per day K > 4.0 Mg > 2.0  Plan:  Blood sugar control improved  with insulin in TPN, but not optimal. Will continue insulin 30 units in TPN and increase sliding scale to resistant coverage (1-20 units) Q4H. Continue lantus 27 units QAM (will reassess prior to next administration)  Will give KCl 40mEq IV and Magnesium 1gm IV. Recheck potassium this evening (target >4.0 due to illeus, digoxin). Repeat electrolytes with AM labs.   10/04:  K @ 19:36 = 3.8 ,   No further electrolyte supplementation needed at this time.  Will recheck electrolytes on 1005 with AM labs.   Yee Joss D Clinical Pharmacist  02/25/2016 9:15 PM

## 2016-02-25 NOTE — Progress Notes (Signed)
[employer] [facility] [place]   PATIENT NAME: [name]    MR#:  [id number]  DATE OF BIRTH:  08/16/1935  SUBJECTIVE:   Patient continues to have abdominal distention with abdominal pain. Not passing gas.  REVIEW OF SYSTEMS:    Review of Systems  Constitutional: Negative for chills, fever and malaise/fatigue.  HENT: Negative.  Negative for ear discharge, ear pain, hearing loss, nosebleeds and sore throat.   Eyes: Negative.  Negative for blurred vision and pain.  Respiratory: Negative.  Negative for cough, hemoptysis, shortness of breath and wheezing.   Cardiovascular: Negative.  Negative for chest pain, palpitations and leg swelling.  Gastrointestinal: Positive for abdominal pain and nausea. Negative for blood in stool, diarrhea and vomiting.  Genitourinary: Negative.  Negative for dysuria.  Musculoskeletal: Negative.  Negative for back pain.  Skin: Negative.   Neurological: Positive for weakness. Negative for dizziness, tremors, speech change, focal weakness, seizures and headaches.  Endo/Heme/Allergies: Negative.  Does not bruise/bleed easily.  Psychiatric/Behavioral: Negative.  Negative for depression, hallucinations and suicidal ideas.    Tolerating Diet: On TPN      DRUG ALLERGIES:   Allergies  Allergen Reactions  . Demerol [Meperidine] Nausea And Vomiting  . Reglan [Metoclopramide] Swelling  . Penicillins Swelling and Rash    Swelling to arm at injection site. Has patient had a PCN reaction causing immediate rash, facial/tongue/throat swelling, SOB or lightheadedness with hypotension: Yes Has patient had a PCN reaction causing severe rash involving mucus membranes or skin necrosis: No Has patient had a PCN reaction that required hospitalization No Has patient had a PCN reaction occurring within the last 10 years: No If all of the above answers are "NO", then may proceed with Cephalosporin use.     VITALS:  Blood pressure 135/62, pulse  (!) 107, temperature 97.6 F (36.4 C), temperature source Oral, resp. rate 20, height 5\' 7"  (1.702 m), weight (!) 138.5 kg (305 lb 6.4 oz), SpO2 99 %.  PHYSICAL EXAMINATION:   Physical Exam  Constitutional: She is oriented to person, place, and time and well-developed, well-nourished, and in no distress. No distress.  HENT:  Head: Normocephalic.  Eyes: No scleral icterus.  Neck: Normal range of motion. Neck supple. No JVD present. No tracheal deviation present.  Cardiovascular: Normal rate, regular rhythm and normal heart sounds.  Exam reveals no gallop and no friction rub.   No murmur heard. Pulmonary/Chest: Effort normal and breath sounds normal. No respiratory distress. She has no wheezes. She has no rales. She exhibits no tenderness.  Abdominal: She exhibits distension. She exhibits no mass. There is tenderness. There is no rebound and no guarding.  Hypoactive bowel sounds  Musculoskeletal: Normal range of motion. She exhibits edema.  Neurological: She is alert and oriented to person, place, and time.  Skin: Skin is warm. No rash noted. No erythema.  Psychiatric: Affect and judgment normal.      LABORATORY PANEL:   CBC  Recent Labs Lab 02/25/16 0352  WBC 6.4  HGB 9.4*  HCT 30.6*  PLT 191   ------------------------------------------------------------------------------------------------------------------  Chemistries   Recent Labs Lab 03/11/2016 1221  02/25/16 0850  NA  --   < > 135  K  --   < > 3.3*  CL  --   < > 104  CO2  --   < > 26  GLUCOSE  --   < > 223*  BUN  --   < > 24*  CREATININE  --   < >  1.00  CALCIUM  --   < > 6.9*  MG  --   < > 1.8  AST 12*  --   --   ALT 8*  --   --   ALKPHOS 49  --   --   BILITOT 0.8  --   --   < > = values in this interval not displayed. ------------------------------------------------------------------------------------------------------------------  Cardiac Enzymes No results for input(s): TROPONINI in the last 168  hours. ------------------------------------------------------------------------------------------------------------------  RADIOLOGY:  Dg Abd 1 View  Result Date: 02/25/2016 CLINICAL DATA:  Small-bowel obstruction.  Abdominal pain. EXAM: ABDOMEN - 1 VIEW COMPARISON:  02/23/2016.  CT 03/22/2016. FINDINGS: NG tube noted coiled in the upper portion of the stomach. Persistent prominently distended loops of small bowel with a paucity colonic gas noted. Although adynamic ileus could present this fashion, findings suggest small bowel obstruction. No acute bony abnormality. Degenerative changes lumbar spine and both hips. Pelvic calcifications consistent with phleboliths . IMPRESSION: NG tube noted coiled in the upper portion stomach. Persistent prominently dilated loops of small bowel noted. There is a paucity of intra colonic air noted on today's examination. Although adynamic ileus could present in this fashion. These findings are suggestive of small-bowel obstruction. No free air . Electronically Signed   By: Maisie Fushomas  Register   On: 02/25/2016 07:55     ASSESSMENT AND PLAN:    80 year old female with chronic atrial fibrillation who presented with acute onset of abdominal pain and found to have SMA occlusion.  1. GI bleed: This is due to ischemic etiology of SMA occlusion. Hemoglobin remained stable Indication for blood transfusion   2. SMA occlusion: Postoperative #3 status post SMA thrombectomy Appreciate vascular surgery consultation. Continue ciprofloxacin and Flagyl. Continue heparin drip   3. Ileus: This is due to problem #2. Keep NG tube suction Continue TPN Continue serial KUBs. Avoid narcotics if possible.  4. Atrial fibrillation with RVR: Heart rate is better controlled. She did have 2 second cause this morning. Continue amiodarone drip Continue when necessary diltiazem Continue digoxin Continue metoprolol  5. Nutrition: Continue TPN and electronic management as per  pharmacy.  6. Diabetes: Continue sliding scale insulin with Lantus.   Discussed with surgery and rounded with nursing staff today. Management plans discussed with the patient and she is in agreement.  CODE STATUS: full  TOTAL TIME TAKING CARE OF THIS PATIENT: 30 minutes.     POSSIBLE D/C ??, DEPENDING ON CLINICAL CONDITION.   Solly Derasmo M.D on 02/25/2016 at 12:56 PM  Between 7am to 6pm - Pager - [phone] After 6pm go to [email or web address] - Social research officer, governmentpassword [email or web address]  [facility] [place] [facility]  Office  [phone]  CC: Primary care physician; No PCP Per Patient  Note: This dictation was prepared with Dragon dictation along with smaller phrase technology. Any transcriptional errors that result from this process are unintentional.

## 2016-02-25 NOTE — Progress Notes (Signed)
Nutrition Follow-up  DOCUMENTATION CODES:   Morbid obesity  INTERVENTION:  -Continue Clinimix E 5/15 @ 4475mL/hr -Continue 20% Lipids @ 2515mL/hr -Monitor electrolytes closely, K goal for ileus >4.0, Mg goal >2.0  NUTRITION DIAGNOSIS:   Inadequate oral intake related to altered GI function as evidenced by per patient/family report. -Ongoing  GOAL:   Patient will meet greater than or equal to 90% of their needs -Meeting w/ TPN  MONITOR:   Labs, Weight trends, I & O's, PO intake, Diet advancement  REASON FOR ASSESSMENT:   Consult New TPN/TNA  ASSESSMENT:   [name]  is a 80 y.o. female who presents with Acute onset abdominal pain with coffee-ground emesis and hematochezia starting today just after lunch time. Here in the ED her blood pressure stable, hemoglobin is stable, she has persistent abdominal pain and nausea.  Continues to tolerate TPN No complaints D/C NS w/ K+ @ 4030mL/hr yesterday No new wts at this point Labs and medications reviewed: CBGs 205-236, K 3.3, Ca 6.9 NGT suction decreasing   Diet Order:  Diet NPO time specified TPN (CLINIMIX-E) Adult  Skin:  Reviewed, no issues  Last BM:  929  Height:   Ht Readings from Last 1 Encounters:  02/16/16 5\' 7"  (1.702 m)    Weight:   Wt Readings from Last 1 Encounters:  02/16/16 (!) 305 lb 6.4 oz (138.5 kg)    Ideal Body Weight:  61.36 kg  BMI:  Body mass index is 47.83 kg/m.  Estimated Nutritional Needs:   Kcal:  1800-2200 calories  Protein:  60-80 gm  Fluid:  >/= 1.8L  EDUCATION NEEDS:   No education needs identified at this time  Dionne AnoWilliam M. Mele Sylvester, MS, RD LDN Inpatient Clinical Dietitian Pager [phone]

## 2016-02-25 NOTE — Progress Notes (Signed)
PARENTERAL NUTRITION CONSULT NOTE - FOLLOW UP  Pharmacy Consult for TPN monitoring Indication: NPO  Allergies  Allergen Reactions  . Demerol [Meperidine] Nausea And Vomiting  . Reglan [Metoclopramide] Swelling  . Penicillins Swelling and Rash    Swelling to arm at injection site. Has patient had a PCN reaction causing immediate rash, facial/tongue/throat swelling, SOB or lightheadedness with hypotension: Yes Has patient had a PCN reaction causing severe rash involving mucus membranes or skin necrosis: No Has patient had a PCN reaction that required hospitalization No Has patient had a PCN reaction occurring within the last 10 years: No If all of the above answers are "NO", then may proceed with Cephalosporin use.     Patient Measurements: Height: 5\' 7"  (170.2 cm) Weight: (!) 305 lb 6.4 oz (138.5 kg) IBW/kg (Calculated) : 61.6 Adjusted Body Weight: 92.36 kg  Vital Signs: Temp: 97.6 F (36.4 C) (10/04 0335) Temp Source: Oral (10/04 0335) BP: 135/62 (10/04 1145) Pulse Rate: 107 (10/04 1145) Intake/Output from previous day: 10/03 0701 - 10/04 0700 In: 1524.4 [I.V.:824.4; IV Piggyback:700] Out: 1075 [Urine:500; Emesis/NG output:575] Intake/Output from this shift: Total I/O In: 3378.4 [I.V.:2478.4; IV Piggyback:900] Out: 250 [Urine:100; Emesis/NG output:150]  Labs:  Recent Labs  02/23/16 0515 02/24/16 0420 02/25/16 0352  WBC 9.5 7.7 6.4  HGB 10.6* 9.6* 9.4*  HCT 31.4* 29.0* 30.6*  PLT 218 221 191     Recent Labs  02/23/16 0515 02/24/16 0420 02/25/16 0850  NA 139 137 135  K 3.6 3.5 3.3*  CL 104 105 104  CO2 29 27 26   GLUCOSE 184* 300* 223*  BUN 22* 23* 24*  CREATININE 0.96 0.97 1.00  CALCIUM 6.7* 6.6* 6.9*  MG 1.8 1.8 1.8  PHOS 3.8 3.1 3.5  ALBUMIN 1.4*  --   --   TRIG 81  --   --    Estimated Creatinine Clearance: 67.6 mL/min (by C-G formula based on SCr of 1 mg/dL).    Recent Labs  02/25/16 0331 02/25/16 0814 02/25/16 1140  GLUCAP 236* 227*  225*    Medications:  Scheduled:  . bisacodyl  10 mg Rectal Daily  . ciprofloxacin  400 mg Intravenous BID  . digoxin  0.125 mg Intravenous Q48H  . digoxin  0.5 mg Intravenous Once  . insulin aspart  0-15 Units Subcutaneous Q4H  . insulin aspart  0-20 Units Subcutaneous Q4H  . insulin glargine  27 Units Subcutaneous Daily  . magnesium sulfate 1 - 4 g bolus IVPB  1 g Intravenous Once  . mouth rinse  15 mL Mouth Rinse BID  . metoprolol  5 mg Intravenous Q6H  . metronidazole  500 mg Intravenous Q8H  . pantoprazole (PROTONIX) IV  40 mg Intravenous Q12H  . potassium chloride  10 mEq Intravenous Q1 Hr x 4  . sodium chloride flush  3 mL Intravenous Q12H   Infusions:  . amiodarone 30 mg/hr (02/25/16 1037)  . Marland Kitchen.TPN (CLINIMIX-E) Adult 70 mL/hr at 02/24/16 1800   And  . fat emulsion 500 mL (02/24/16 1759)  . heparin 1,950 Units/hr (02/25/16 1103)    Insulin Requirements in the past 24 hours:  Insulin glargine 23 units yesterday AM, SSI 42 units, 30 units of insulin added to TPN  Current Nutrition:  Clinimix 5/15 E at1770ml/hr and Lipids 20% at 3115ml/hr  Assessment: TPN advanced yesterday evening, blood sugars elevated  Nutritional Goals:  1912 kCal, 84 grams of protein per day K > 4.0 Mg > 2.0  Plan:  Blood sugar control  improved with insulin in TPN, but not optimal. Will continue insulin 30 units in TPN and increase sliding scale to resistant coverage (1-20 units) Q4H. Continue lantus 27 units QAM (will reassess prior to next administration)  Will give KCl IV and Magnesium 1gm IV. Recheck potassium this evening (target >4.0 due to illeus, digoxin). Repeat electrolytes with AM labs.   Garlon Hatchet, PharmD Clinical Pharmacist  02/25/2016 1:44 PM

## 2016-02-26 ENCOUNTER — Inpatient Hospital Stay: Payer: Commercial Managed Care - HMO

## 2016-02-26 LAB — GLUCOSE, CAPILLARY
GLUCOSE-CAPILLARY: 189 mg/dL — AB (ref 65–99)
GLUCOSE-CAPILLARY: 174 mg/dL — AB (ref 65–99)
GLUCOSE-CAPILLARY: 157 mg/dL — AB (ref 65–99)
Glucose-Capillary: 179 mg/dL — ABNORMAL HIGH (ref 65–99)
Glucose-Capillary: 189 mg/dL — ABNORMAL HIGH (ref 65–99)
Glucose-Capillary: 209 mg/dL — ABNORMAL HIGH (ref 65–99)

## 2016-02-26 LAB — BASIC METABOLIC PANEL
ANION GAP: 5 (ref 5–15)
BUN: 26 mg/dL — AB (ref 6–20)
CHLORIDE: 105 mmol/L (ref 101–111)
CO2: 24 mmol/L (ref 22–32)
Calcium: 7 mg/dL — ABNORMAL LOW (ref 8.9–10.3)
Creatinine, Ser: 1.03 mg/dL — ABNORMAL HIGH (ref 0.44–1.00)
GFR calc Af Amer: 59 mL/min — ABNORMAL LOW (ref >60)
GFR calc non Af Amer: 51 mL/min — ABNORMAL LOW (ref >60)
GLUCOSE: 192 mg/dL — AB (ref 65–99)
POTASSIUM: 3.7 mmol/L (ref 3.5–5.1)
Sodium: 134 mmol/L — ABNORMAL LOW (ref 135–145)

## 2016-02-26 LAB — MAGNESIUM: MAGNESIUM: 1.9 mg/dL (ref 1.7–2.4)

## 2016-02-26 LAB — CBC
HEMATOCRIT: 28 % — AB (ref 35.0–47.0)
Hemoglobin: 9.4 g/dL — ABNORMAL LOW (ref 12.0–16.0)
MCH: 30.3 pg (ref 26.0–34.0)
MCHC: 33.6 g/dL (ref 32.0–36.0)
MCV: 90.4 fL (ref 80.0–100.0)
Platelets: 236 10*3/uL (ref 150–440)
RBC: 3.1 MIL/uL — AB (ref 3.80–5.20)
RDW: 14.6 % — ABNORMAL HIGH (ref 11.5–14.5)
WBC: 6.3 10*3/uL (ref 3.6–11.0)

## 2016-02-26 LAB — HEPARIN LEVEL (UNFRACTIONATED): HEPARIN UNFRACTIONATED: 0.15 [IU]/mL — AB (ref 0.30–0.70)

## 2016-02-26 LAB — ALBUMIN: ALBUMIN: 1.2 g/dL — AB (ref 3.5–5.0)

## 2016-02-26 MED ORDER — TRACE MINERALS CR-CU-MN-SE-ZN 10-1000-500-60 MCG/ML IV SOLN
INTRAVENOUS | Status: AC
  Administered 2016-02-26: 18:00:00 via INTRAVENOUS
  Filled 2016-02-26: qty 1680

## 2016-02-26 MED ORDER — HEPARIN BOLUS VIA INFUSION
2800.0000 [IU] | Freq: Once | INTRAVENOUS | Status: AC
  Administered 2016-02-26: 2800 [IU] via INTRAVENOUS
  Filled 2016-02-26: qty 2800

## 2016-02-26 MED ORDER — POTASSIUM CHLORIDE 10 MEQ/100ML IV SOLN
10.0000 meq | INTRAVENOUS | Status: AC
  Administered 2016-02-26 (×3): 10 meq via INTRAVENOUS
  Filled 2016-02-26 (×3): qty 100

## 2016-02-26 MED ORDER — FAT EMULSION 20 % IV EMUL
500.0000 mL | INTRAVENOUS | Status: AC
  Administered 2016-02-26 (×2): 500 mL via INTRAVENOUS
  Filled 2016-02-26 (×2): qty 500

## 2016-02-26 MED ORDER — DICYCLOMINE HCL 10 MG/ML IM SOLN
10.0000 mg | Freq: Once | INTRAMUSCULAR | Status: AC
  Administered 2016-02-26: 10 mg via INTRAMUSCULAR
  Filled 2016-02-26: qty 1

## 2016-02-26 MED ORDER — INSULIN GLARGINE 100 UNIT/ML ~~LOC~~ SOLN
30.0000 [IU] | Freq: Every day | SUBCUTANEOUS | Status: DC
  Administered 2016-02-26 – 2016-02-28 (×3): 30 [IU] via SUBCUTANEOUS
  Filled 2016-02-26 (×6): qty 0.3

## 2016-02-26 MED ORDER — MAGNESIUM SULFATE 2 GM/50ML IV SOLN
2.0000 g | Freq: Once | INTRAVENOUS | Status: AC
  Administered 2016-02-26: 2 g via INTRAVENOUS
  Filled 2016-02-26: qty 50

## 2016-02-26 NOTE — Progress Notes (Signed)
ANTICOAGULATION CONSULT NOTE - Follow Up Consult  Pharmacy Consult for Heparin Indication: SMA occlusion  Allergies  Allergen Reactions  . Demerol [Meperidine] Nausea And Vomiting  . Reglan [Metoclopramide] Swelling  . Penicillins Swelling and Rash    Swelling to arm at injection site. Has patient had a PCN reaction causing immediate rash, facial/tongue/throat swelling, SOB or lightheadedness with hypotension: Yes Has patient had a PCN reaction causing severe rash involving mucus membranes or skin necrosis: No Has patient had a PCN reaction that required hospitalization No Has patient had a PCN reaction occurring within the last 10 years: No If all of the above answers are "NO", then may proceed with Cephalosporin use.     Patient Measurements: Height: 5\' 7"  (170.2 cm) Weight: (!) 305 lb 6.4 oz (138.5 kg) IBW/kg (Calculated) : 61.6 Heparin Dosing Weight: 95.5 kg  Vital Signs: Temp: 97.5 F (36.4 C) (10/05 1236) Temp Source: Oral (10/05 1236) BP: 127/98 (10/05 1236) Pulse Rate: 100 (10/05 1236)  Labs:  Recent Labs  02/24/16 0420 02/25/16 0352  02/25/16 0850 02/25/16 1938 02/26/16 0414 02/26/16 1140  HGB 9.6* 9.4*  --   --  10.0* 9.4*  --   HCT 29.0* 30.6*  --   --   --  28.0*  --   PLT 221 191  --   --   --  236  --   HEPARINUNFRC 0.42 SPECIMEN GROSSLY LIPEMIC, UNABLE TO RESULT DUE TO INTERFERING SUBSTANCES  < > <0.10* 0.32  --  0.15*  CREATININE 0.97  --   --  1.00  --  1.03*  --   < > = values in this interval not displayed.  Estimated Creatinine Clearance: 65.7 mL/min (by C-G formula based on SCr of 1.03 mg/dL (H)).   Medications:  Scheduled:  . bisacodyl  10 mg Rectal Daily  . ciprofloxacin  400 mg Intravenous BID  . digoxin  0.125 mg Intravenous Q48H  . digoxin  0.5 mg Intravenous Once  . insulin aspart  0-20 Units Subcutaneous Q4H  . insulin glargine  30 Units Subcutaneous Daily  . mouth rinse  15 mL Mouth Rinse BID  . metoprolol  5 mg Intravenous Q6H   . metronidazole  500 mg Intravenous Q8H  . pantoprazole (PROTONIX) IV  40 mg Intravenous Q12H  . potassium chloride  10 mEq Intravenous Q1 Hr x 3  . sodium chloride flush  3 mL Intravenous Q12H   Infusions:  . amiodarone 30 mg/hr (02/26/16 1107)  . Marland Kitchen.TPN (CLINIMIX-E) Adult 70 mL/hr at 02/25/16 1849   And  . fat emulsion 500 mL (02/26/16 0500)  . heparin 1,950 Units/hr (02/26/16 1108)    Assessment: Pharmacy consulted to initiate heparin drip for 80 yo female being treated for possible SMA occulusion. Patient admitted with GI bleed. Patient with Afib not on chronic anticoagulation due to GI bleeds.  Goal of Therapy:  Heparin level 0.3-0.7 units/ml Monitor platelets by anticoagulation protocol: Yes   Plan:  Current orders for heparin 1950 units/hr. Heparin therapeutic x 1 yesterday but suptherapeutic again today. Per RN heparin has been running continuously.   Will give heparin bolus 2,800 units x1 in increase drip to 2250 units/hr.   Follow closely as patient with history of GIB on anticoagulation. No signs of bleeding at this time, Hgb stable. Will recheck heparin level in 8 hours. If level therapeutic, continue q8H heparin checks due to continued variation in heparin levels.   Garlon HatchetJody Lovenia Debruler, PharmD Clinical Pharmacist  02/26/2016 1:13 PM

## 2016-02-26 NOTE — Progress Notes (Addendum)
PARENTERAL NUTRITION CONSULT NOTE - FOLLOW UP  Pharmacy Consult for TPN monitoring Indication: NPO  Allergies  Allergen Reactions  . Demerol [Meperidine] Nausea And Vomiting  . Reglan [Metoclopramide] Swelling  . Penicillins Swelling and Rash    Swelling to arm at injection site. Has patient had a PCN reaction causing immediate rash, facial/tongue/throat swelling, SOB or lightheadedness with hypotension: Yes Has patient had a PCN reaction causing severe rash involving mucus membranes or skin necrosis: No Has patient had a PCN reaction that required hospitalization No Has patient had a PCN reaction occurring within the last 10 years: No If all of the above answers are "NO", then may proceed with Cephalosporin use.     Patient Measurements: Height: 5\' 7"  (170.2 cm) Weight: (!) 305 lb 6.4 oz (138.5 kg) IBW/kg (Calculated) : 61.6 Adjusted Body Weight: 92.36 kg  Vital Signs: Temp: 97.5 F (36.4 C) (10/05 1236) Temp Source: Oral (10/05 1236) BP: 127/98 (10/05 1236) Pulse Rate: 100 (10/05 1236) Intake/Output from previous day: 10/04 0701 - 10/05 0700 In: 4979.9 [I.V.:3779.9; IV Piggyback:1200] Out: 1450 [Urine:850; Emesis/NG output:600] Intake/Output from this shift: No intake/output data recorded.  Labs:  Recent Labs  02/24/16 0420 02/25/16 0352 02/25/16 1938 02/26/16 0414  WBC 7.7 6.4  --  6.3  HGB 9.6* 9.4* 10.0* 9.4*  HCT 29.0* 30.6*  --  28.0*  PLT 221 191  --  236     Recent Labs  02/24/16 0420 02/25/16 0850 02/25/16 1938 02/26/16 0414  NA 137 135  --  134*  K 3.5 3.3* 3.8 3.7  CL 105 104  --  105  CO2 27 26  --  24  GLUCOSE 300* 223*  --  192*  BUN 23* 24*  --  26*  CREATININE 0.97 1.00  --  1.03*  CALCIUM 6.6* 6.9*  --  7.0*  MG 1.8 1.8  --  1.9  PHOS 3.1 3.5  --   --   ALBUMIN  --   --   --  1.2*   Estimated Creatinine Clearance: 65.7 mL/min (by C-G formula based on SCr of 1.03 mg/dL (H)).    Recent Labs  02/26/16 0443 02/26/16 0736  02/26/16 1119  GLUCAP 179* 189* 174*    Medications:  Scheduled:  . bisacodyl  10 mg Rectal Daily  . ciprofloxacin  400 mg Intravenous BID  . digoxin  0.125 mg Intravenous Q48H  . digoxin  0.5 mg Intravenous Once  . heparin  2,800 Units Intravenous Once  . insulin aspart  0-20 Units Subcutaneous Q4H  . insulin glargine  30 Units Subcutaneous Daily  . magnesium sulfate 1 - 4 g bolus IVPB  2 g Intravenous Once  . mouth rinse  15 mL Mouth Rinse BID  . metoprolol  5 mg Intravenous Q6H  . metronidazole  500 mg Intravenous Q8H  . pantoprazole (PROTONIX) IV  40 mg Intravenous Q12H  . sodium chloride flush  3 mL Intravenous Q12H   Infusions:  . amiodarone 30 mg/hr (02/26/16 1107)  . Marland KitchenTPN (CLINIMIX-E) Adult 70 mL/hr at 02/25/16 1849   And  . fat emulsion 500 mL (02/26/16 0500)  . heparin 1,950 Units/hr (02/26/16 1108)    Insulin Requirements in the past 24 hours:  Insulin glargine 27 units yesterday AM, SSI 26 units, 30 units of insulin added to TPN  Current Nutrition:  Clinimix 5/15 E at32ml/hr and Lipids 20% at 49ml/hr  Nutritional Goals:  1912 kCal, 84 grams of protein per day K > 4.0  Mg > 2.0 BG < 180  Plan:  Blood sugar control much improved. Increased insulin glargine to 30 units QAM to target BG < 180. Continue 30 units of insulin in TPN and sliding scale insulin Q4H.  Will give KCl 30 mEq IV and Magnesium 2gm IV. Recheck electrolytes with AM labs.   Garlon HatchetJody Perel Hauschild, PharmD Clinical Pharmacist  02/26/2016 1:39 PM

## 2016-02-26 NOTE — Care Management (Signed)
Barrier- continues with ng to suction.  Output is decreasing.  Remains on amoidarone drip

## 2016-02-26 NOTE — Progress Notes (Signed)
Nutrition Follow-up  DOCUMENTATION CODES:   Morbid obesity  INTERVENTION:  Continue Clinimix E 5/15 @ 4670mL/hr Continue 20% lipids @ 6715mL/hr Electrolytes improved, K 3.7 (goal >4 for ileus), Mg 1.9 (goal >2.0 for ileus)  NUTRITION DIAGNOSIS:   Inadequate oral intake related to altered GI function as evidenced by per patient/family report. -Ongoing  GOAL:   Patient will meet greater than or equal to 90% of their needs -Meeting with TPN  MONITOR:   Labs, Weight trends, I & O's, PO intake, Diet advancement  REASON FOR ASSESSMENT:   Consult New TPN/TNA  ASSESSMENT:   [name]  is a 80 y.o. female who presents with Acute onset abdominal pain with coffee-ground emesis and hematochezia starting today just after lunch time. Here in the ED her blood pressure stable, hemoglobin is stable, she has persistent abdominal pain and nausea.  Continue on TPN No acute complains today NGT output down significantly, 125cc during time of visit, still dark brown Abdomen taut, distended. Labs and medications reviewed: CBGs improving, 174-189, recommend goal <150 Na 134 KCL 10meQ x3 Amiodarone drip  Diet Order:  Diet NPO time specified TPN (CLINIMIX-E) Adult  Skin:  Reviewed, no issues  Last BM:  929  Height:   Ht Readings from Last 1 Encounters:  02/16/16 5\' 7"  (1.702 m)    Weight:   Wt Readings from Last 1 Encounters:  02/16/16 (!) 305 lb 6.4 oz (138.5 kg)    Ideal Body Weight:  61.36 kg  BMI:  Body mass index is 47.83 kg/m.  Estimated Nutritional Needs:   Kcal:  1800-2200 calories  Protein:  60-80 gm  Fluid:  >/= 1.8L  EDUCATION NEEDS:   No education needs identified at this time  Dionne AnoWilliam M. Tashia Leiterman, MS, RD LDN Inpatient Clinical Dietitian Pager [phone]

## 2016-02-26 NOTE — Progress Notes (Signed)
Pt is refusing to wear CPAP. Pt has a NG tube and doesn't want to wear CPAP.

## 2016-02-26 NOTE — Care Management Important Message (Signed)
Important Message  Patient Details  Name: [name] MRN: [id number] Date of Birth: 06/12/1935   Medicare Important Message Given:  Yes    Eber HongGreene, Mark Benecke R, RN 02/26/2016, 9:50 AM

## 2016-02-26 NOTE — Progress Notes (Deleted)
Pt to dialysis.

## 2016-02-26 NOTE — Progress Notes (Signed)
[facility] [place] [facility]   PATIENT NAME: [name]    MR#:  [id number]  DATE OF BIRTH:  12/12/1935  SUBJECTIVE:   PatientContinues to have abdominal pain with distention. She is not passing flatus.   REVIEW OF SYSTEMS:    Review of Systems  Constitutional: Negative for chills, fever and malaise/fatigue.  HENT: Negative.  Negative for ear discharge, ear pain, hearing loss, nosebleeds and sore throat.   Eyes: Negative.  Negative for blurred vision and pain.  Respiratory: Negative.  Negative for cough, hemoptysis, shortness of breath and wheezing.   Cardiovascular: Negative.  Negative for chest pain, palpitations and leg swelling.  Gastrointestinal: Positive for abdominal pain. Negative for blood in stool, diarrhea, nausea and vomiting.  Genitourinary: Negative.  Negative for dysuria.  Musculoskeletal: Negative.  Negative for back pain.  Skin: Negative.   Neurological: Positive for weakness. Negative for dizziness, tremors, speech change, focal weakness, seizures and headaches.  Endo/Heme/Allergies: Negative.  Does not bruise/bleed easily.  Psychiatric/Behavioral: Negative.  Negative for depression, hallucinations and suicidal ideas.    Tolerating Diet: On TPN      DRUG ALLERGIES:   Allergies  Allergen Reactions  . Demerol [Meperidine] Nausea And Vomiting  . Reglan [Metoclopramide] Swelling  . Penicillins Swelling and Rash    Swelling to arm at injection site. Has patient had a PCN reaction causing immediate rash, facial/tongue/throat swelling, SOB or lightheadedness with hypotension: Yes Has patient had a PCN reaction causing severe rash involving mucus membranes or skin necrosis: No Has patient had a PCN reaction that required hospitalization No Has patient had a PCN reaction occurring within the last 10 years: No If all of the above answers are "NO", then may proceed with Cephalosporin use.     VITALS:  Blood pressure (!) 150/65,  pulse (!) 102, temperature 97.7 F (36.5 C), temperature source Oral, resp. rate 20, height 5\' 7"  (1.702 m), weight (!) 138.5 kg (305 lb 6.4 oz), SpO2 95 %.  PHYSICAL EXAMINATION:   Physical Exam  Constitutional: She is oriented to person, place, and time and well-developed, well-nourished, and in no distress. No distress.  HENT:  Head: Normocephalic.  Eyes: No scleral icterus.  Neck: Normal range of motion. Neck supple. No JVD present. No tracheal deviation present.  Cardiovascular: Normal rate, regular rhythm and normal heart sounds.  Exam reveals no gallop and no friction rub.   No murmur heard. Pulmonary/Chest: Effort normal and breath sounds normal. No respiratory distress. She has no wheezes. She has no rales. She exhibits no tenderness.  Abdominal: She exhibits distension. She exhibits no mass. There is no rebound and no guarding.  Hypoactive bowel sounds  Musculoskeletal: Normal range of motion. She exhibits edema.  Neurological: She is alert and oriented to person, place, and time.  Skin: Skin is warm. No rash noted. No erythema.  Psychiatric: Affect and judgment normal.      LABORATORY PANEL:   CBC  Recent Labs Lab 02/26/16 0414  WBC 6.3  HGB 9.4*  HCT 28.0*  PLT 236   ------------------------------------------------------------------------------------------------------------------  Chemistries   Recent Labs Lab 03/07/2016 1221  02/26/16 0414  NA  --   < > 134*  K  --   < > 3.7  CL  --   < > 105  CO2  --   < > 24  GLUCOSE  --   < > 192*  BUN  --   < > 26*  CREATININE  --   < >  1.03*  CALCIUM  --   < > 7.0*  MG  --   < > 1.9  AST 12*  --   --   ALT 8*  --   --   ALKPHOS 49  --   --   BILITOT 0.8  --   --   < > = values in this interval not displayed. ------------------------------------------------------------------------------------------------------------------  Cardiac Enzymes No results for input(s): TROPONINI in the last 168  hours. ------------------------------------------------------------------------------------------------------------------  RADIOLOGY:  Dg Abd 1 View  Result Date: 02/26/2016 CLINICAL DATA:  80 year old female with a history of small bowel obstruction EXAM: ABDOMEN - 1 VIEW COMPARISON:  Plain film 02/25/2016, 02/23/2016, CT 03/07/2016 FINDINGS: Gastric tube terminates within the stomach. Unchanged gas pattern with dilated small bowel loops. Absence of colonic gas. Degenerative changes of the spine. IMPRESSION: Persisting small bowel obstruction with absence of colonic gas. Unchanged gastric tube. Signed, Yvone Neu. Loreta Ave, DO [facility] Radiology Electronically Signed   By: Gilmer Mor D.O.   On: 02/26/2016 08:58   Dg Abd 1 View  Result Date: 02/25/2016 CLINICAL DATA:  Small-bowel obstruction.  Abdominal pain. EXAM: ABDOMEN - 1 VIEW COMPARISON:  02/23/2016.  CT 03/17/2016. FINDINGS: NG tube noted coiled in the upper portion of the stomach. Persistent prominently distended loops of small bowel with a paucity colonic gas noted. Although adynamic ileus could present this fashion, findings suggest small bowel obstruction. No acute bony abnormality. Degenerative changes lumbar spine and both hips. Pelvic calcifications consistent with phleboliths . IMPRESSION: NG tube noted coiled in the upper portion stomach. Persistent prominently dilated loops of small bowel noted. There is a paucity of intra colonic air noted on today's examination. Although adynamic ileus could present in this fashion. These findings are suggestive of small-bowel obstruction. No free air . Electronically Signed   By: Maisie Fus  Register   On: 02/25/2016 07:55     ASSESSMENT AND PLAN:    80 year old female with chronic atrial fibrillation who presented with acute onset of abdominal pain and found to have SMA occlusion.  1. GI bleed: This is due to ischemic etiology of SMA  occlusion. Hemoglobin has remained stable NO Indication for blood transfusion   2. 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 Continue ciprofloxacin and Flagyl. Continue heparin drip   3. Ileus: This is due to problem #2. Keep NG tube suction Continue TPN Continue serial KUBs. Avoid narcotics if possible.  4. Atrial fibrillation with RVR: Heart rate is better controlled.  Continue amiodarone drip Continue when necessary diltiazem Continue digoxin Continue metoprolol  5. Nutrition: Continue TPN and electrolyte management as per pharmacy.  6. Diabetes: Continue sliding scale insulin with Lantus.    rounded with nursing staff today. Management plans discussed with the patient and family and she is in agreement.  CODE STATUS: full  TOTAL TIME TAKING CARE OF THIS PATIENT: 26 minutes.     POSSIBLE D/C ??, DEPENDING ON CLINICAL CONDITION.   Saavi Mceachron M.D on 02/26/2016 at 12:02 PM  Between 7am to 6pm - Pager - [phone] After 6pm go to [email or web address] - Social research officer, government  [facility]  Office  [phone]  CC: Primary care physician; No PCP Per Patient  Note: This dictation was prepared with Dragon dictation along with smaller phrase technology. Any transcriptional errors that result from this process are unintentional.

## 2016-02-27 ENCOUNTER — Inpatient Hospital Stay: Payer: Commercial Managed Care - HMO

## 2016-02-27 ENCOUNTER — Encounter: Payer: Self-pay | Admitting: Radiology

## 2016-02-27 DIAGNOSIS — K551 Chronic vascular disorders of intestine: Secondary | ICD-10-CM

## 2016-02-27 DIAGNOSIS — J9600 Acute respiratory failure, unspecified whether with hypoxia or hypercapnia: Secondary | ICD-10-CM

## 2016-02-27 LAB — CBC
HEMATOCRIT: 27 % — AB (ref 35.0–47.0)
HEMOGLOBIN: 9.2 g/dL — AB (ref 12.0–16.0)
MCH: 30.6 pg (ref 26.0–34.0)
MCHC: 33.9 g/dL (ref 32.0–36.0)
MCV: 90.2 fL (ref 80.0–100.0)
Platelets: 244 10*3/uL (ref 150–440)
RBC: 2.99 MIL/uL — ABNORMAL LOW (ref 3.80–5.20)
RDW: 14.6 % — ABNORMAL HIGH (ref 11.5–14.5)
WBC: 6.2 10*3/uL (ref 3.6–11.0)

## 2016-02-27 LAB — HEPARIN LEVEL (UNFRACTIONATED)
HEPARIN UNFRACTIONATED: 0.56 [IU]/mL (ref 0.30–0.70)
HEPARIN UNFRACTIONATED: 0.43 [IU]/mL (ref 0.30–0.70)
Heparin Unfractionated: 0.36 IU/mL (ref 0.30–0.70)

## 2016-02-27 LAB — BASIC METABOLIC PANEL
ANION GAP: 5 (ref 5–15)
BUN: 26 mg/dL — ABNORMAL HIGH (ref 6–20)
CALCIUM: 7.1 mg/dL — AB (ref 8.9–10.3)
CO2: 25 mmol/L (ref 22–32)
Chloride: 102 mmol/L (ref 101–111)
Creatinine, Ser: 1.22 mg/dL — ABNORMAL HIGH (ref 0.44–1.00)
GFR, EST AFRICAN AMERICAN: 48 mL/min — AB (ref >60)
GFR, EST NON AFRICAN AMERICAN: 41 mL/min — AB (ref >60)
GLUCOSE: 194 mg/dL — AB (ref 65–99)
POTASSIUM: 3.9 mmol/L (ref 3.5–5.1)
SODIUM: 132 mmol/L — AB (ref 135–145)

## 2016-02-27 LAB — GLUCOSE, CAPILLARY
GLUCOSE-CAPILLARY: 179 mg/dL — AB (ref 65–99)
GLUCOSE-CAPILLARY: 183 mg/dL — AB (ref 65–99)
GLUCOSE-CAPILLARY: 209 mg/dL — AB (ref 65–99)
Glucose-Capillary: 195 mg/dL — ABNORMAL HIGH (ref 65–99)
Glucose-Capillary: 145 mg/dL — ABNORMAL HIGH (ref 65–99)
Glucose-Capillary: 128 mg/dL — ABNORMAL HIGH (ref 65–99)

## 2016-02-27 LAB — APTT

## 2016-02-27 LAB — ANTITHROMBIN III: ANTITHROMB III FUNC: 45 % — AB (ref 75–120)

## 2016-02-27 MED ORDER — SODIUM CHLORIDE 0.9 % IV SOLN
INTRAVENOUS | Status: DC
  Administered 2016-02-27 – 2016-02-29 (×3): via INTRAVENOUS

## 2016-02-27 MED ORDER — TRACE MINERALS CR-CU-MN-SE-ZN 10-1000-500-60 MCG/ML IV SOLN
INTRAVENOUS | Status: AC
  Administered 2016-02-27: 19:00:00 via INTRAVENOUS
  Filled 2016-02-27 (×2): qty 1680

## 2016-02-27 MED ORDER — LORAZEPAM 2 MG/ML IJ SOLN
0.2500 mg | Freq: Once | INTRAMUSCULAR | Status: AC
  Administered 2016-02-28: 0.25 mg via INTRAVENOUS
  Filled 2016-02-27: qty 1

## 2016-02-27 MED ORDER — IOPAMIDOL (ISOVUE-300) INJECTION 61%
75.0000 mL | Freq: Once | INTRAVENOUS | Status: AC | PRN
  Administered 2016-02-27: 75 mL via INTRAVENOUS

## 2016-02-27 MED ORDER — KETOROLAC TROMETHAMINE 30 MG/ML IJ SOLN
15.0000 mg | Freq: Four times a day (QID) | INTRAMUSCULAR | Status: AC | PRN
  Administered 2016-02-27: 15 mg via INTRAVENOUS
  Filled 2016-02-27: qty 1

## 2016-02-27 MED ORDER — ACETAMINOPHEN 10 MG/ML IV SOLN
1000.0000 mg | Freq: Four times a day (QID) | INTRAVENOUS | Status: AC | PRN
  Administered 2016-02-27 (×2): 1000 mg via INTRAVENOUS
  Filled 2016-02-27 (×4): qty 100

## 2016-02-27 NOTE — Progress Notes (Signed)
PT Cancellation Note  Patient Details Name: Kathrina L Wesolowski MRDyanne Carrel: [id number] DOB: 09/15/1935   Cancelled Treatment:    Reason Eval/Treat Not Completed: Patient not medically ready Pt. APTT critical value. Awaiting additional consultations and imaging for further evaluation this afternoon, PT order states start tomorrow. Will re-attempt tomorrow.    Huntley DecLeah Gabrial Poppell 02/27/2016, 1:52 PM

## 2016-02-27 NOTE — Progress Notes (Signed)
ANTICOAGULATION CONSULT NOTE - Follow Up Consult  Pharmacy Consult for Heparin Indication: SMA occlusion  Allergies  Allergen Reactions  . Demerol [Meperidine] Nausea And Vomiting  . Reglan [Metoclopramide] Swelling  . Penicillins Swelling and Rash    Swelling to arm at injection site. Has patient had a PCN reaction causing immediate rash, facial/tongue/throat swelling, SOB or lightheadedness with hypotension: Yes Has patient had a PCN reaction causing severe rash involving mucus membranes or skin necrosis: No Has patient had a PCN reaction that required hospitalization No Has patient had a PCN reaction occurring within the last 10 years: No If all of the above answers are "NO", then may proceed with Cephalosporin use.     Patient Measurements: Height: 5\' 7"  (170.2 cm) Weight: 248 lb 10.9 oz (112.8 kg) IBW/kg (Calculated) : 61.6 Heparin Dosing Weight: 95.5 kg  Vital Signs: Temp: 97.9 F (36.6 C) (10/05 1953) Temp Source: Oral (10/05 1953) BP: 122/58 (10/06 0048) Pulse Rate: 100 (10/06 0048)  Labs:  Recent Labs  02/25/16 0352  02/25/16 0850 02/25/16 1938 02/26/16 0414 02/26/16 1140 02/27/16 0255  HGB 9.4*  --   --  10.0* 9.4*  --  9.2*  HCT 30.6*  --   --   --  28.0*  --  27.0*  PLT 191  --   --   --  236  --  244  HEPARINUNFRC SPECIMEN GROSSLY LIPEMIC, UNABLE TO RESULT DUE TO INTERFERING SUBSTANCES  < > <0.10* 0.32  --  0.15* 0.56  CREATININE  --   --  1.00  --  1.03*  --  1.22*  < > = values in this interval not displayed.  Estimated Creatinine Clearance: 49.3 mL/min (by C-G formula based on SCr of 1.22 mg/dL (H)).   Medications:  Scheduled:  . bisacodyl  10 mg Rectal Daily  . ciprofloxacin  400 mg Intravenous BID  . digoxin  0.125 mg Intravenous Q48H  . digoxin  0.5 mg Intravenous Once  . insulin aspart  0-20 Units Subcutaneous Q4H  . insulin glargine  30 Units Subcutaneous Daily  . mouth rinse  15 mL Mouth Rinse BID  . metoprolol  5 mg Intravenous Q6H   . metronidazole  500 mg Intravenous Q8H  . pantoprazole (PROTONIX) IV  40 mg Intravenous Q12H  . sodium chloride flush  3 mL Intravenous Q12H   Infusions:  . amiodarone 30 mg/hr (02/26/16 2248)  . Marland Kitchen.TPN (CLINIMIX-E) Adult 70 mL/hr at 02/26/16 1823   And  . fat emulsion Stopped (02/27/16 0400)  . heparin 2,250 Units/hr (02/26/16 2248)    Assessment: Pharmacy consulted to initiate heparin drip for 80 yo female being treated for possible SMA occulusion. Patient admitted with GI bleed. Patient with Afib not on chronic anticoagulation due to GI bleeds.  Goal of Therapy:  Heparin level 0.3-0.7 units/ml Monitor platelets by anticoagulation protocol: Yes   Plan:  Current orders for heparin 1950 units/hr. Heparin therapeutic x 1 yesterday but suptherapeutic again today. Per RN heparin has been running continuously.   Will give heparin bolus 2,800 units x1 in increase drip to 2250 units/hr.   Follow closely as patient with history of GIB on anticoagulation. No signs of bleeding at this time, Hgb stable. Will recheck heparin level in 8 hours. If level therapeutic, continue q8H heparin checks due to continued variation in heparin levels.   10/6 03:00 heparin level 0.56. Recheck in 8 hours.  Garlon HatchetJody Barefoot, PharmD Clinical Pharmacist  02/27/2016 4:21 AM

## 2016-02-27 NOTE — Progress Notes (Signed)
Per Barbara CowerJason, Pharmacist, IV tylenol can be infused with patient's heparin gtt.

## 2016-02-27 NOTE — Progress Notes (Addendum)
Nutrition Follow-up  DOCUMENTATION CODES:   Morbid obesity  INTERVENTION:  -TPN: recommend changing to 5%AA/15%Dextrose at rate of 85 ml/hr providing 102 g protein, 1448 kcals, 2040 mL fluid. Recommend 20% lipid infusion 3 times weekly (infuse at 50 ml/hr for 10 hours) providing additional 429 kcals per day on average for total of 1877 kcals/day on average (Recommend infusing 20% lipids on Mondays, Wednesday and Friday beginning on 03/01/16). TPN profile: 23% fat, 22% protein and 55% carbohydrate. Noted sodium trending down, currently 132, noted NS at 50 ml/hr started today. Will continue to assess. May need to adjust TPN. Recommend checking TG in AM in addition to other TPN labs. Continue to assess  NUTRITION DIAGNOSIS:   Inadequate oral intake related to altered GI function as evidenced by per patient/family report.  Being addressed via TPN  GOAL:   Patient will meet greater than or equal to 90% of their needs  Met  MONITOR:   Labs, Weight trends, I & O's, PO intake, Diet advancement  REASON FOR ASSESSMENT:   Consult New TPN/TNA  ASSESSMENT:   Pt remains NPO; 5%AA/15%Dextrose infusing at 70 ml/hr with 20% Lipids 15 ml/hr via PICC line. NG output only 350 mL in 24 hours. Poor documentation of UOP, 650 mL documented on night shift but nothing on day shift  Noted new weights, needs adjusted. Pt with non pitting generalized edema.   Diet Order:  Diet NPO time specified TPN (CLINIMIX-E) Adult  Skin:  Reviewed, no issues  Last BM:  9/30   Labs: sodium 132 Glucose Profile:   Recent Labs  02/27/16 0511 02/27/16 0721 02/27/16 1123  GLUCAP 179* 195* 209*    Meds: NS at 50 ml/hr, ss novolog  Height:   Ht Readings from Last 1 Encounters:  02/16/16 _0  (1.702 m)    Weight:   Wt Readings from Last 1 Encounters:  02/27/16 246 lb 4.1 oz (111.7 kg)   Filed Weights   02/16/16 1505 02/26/16 1600 02/27/16 0514  Weight: (!) 305 lb 6.4 oz (138.5 kg) 248 lb 10.9 oz  (112.8 kg) 246 lb 4.1 oz (111.7 kg)    Ideal Body Weight:  61.36 kg  BMI:  Body mass index is 38.57 kg/m.  Estimated Nutritional Needs:   Kcal:  1800-2200 calories  Protein:  90-115 g  Fluid:  >/= 1.8L  EDUCATION NEEDS:   No education needs identified at this time  [place], [place], [place] [phone] Pager  [phone] Weekend/On-Call Pager

## 2016-02-27 NOTE — Progress Notes (Signed)
Spoke with CT, okay to start giving contrast bottles. First bottle given at 1:30pm, second bottle to start at 2:30. Will call CT when finished. NG tube clamped per MD order.

## 2016-02-27 NOTE — Progress Notes (Signed)
ANTICOAGULATION CONSULT NOTE - Follow Up Consult  Pharmacy Consult for Heparin Indication: SMA occlusion  Allergies  Allergen Reactions  . Demerol [Meperidine] Nausea And Vomiting  . Reglan [Metoclopramide] Swelling  . Penicillins Swelling and Rash    Swelling to arm at injection site. Has patient had a PCN reaction causing immediate rash, facial/tongue/throat swelling, SOB or lightheadedness with hypotension: Yes Has patient had a PCN reaction causing severe rash involving mucus membranes or skin necrosis: No Has patient had a PCN reaction that required hospitalization No Has patient had a PCN reaction occurring within the last 10 years: No If all of the above answers are "NO", then may proceed with Cephalosporin use.     Patient Measurements: Height: 5\' 7"  (170.2 cm) Weight: 246 lb 4.1 oz (111.7 kg) IBW/kg (Calculated) : 61.6 Heparin Dosing Weight: 95.5 kg  Vital Signs: Temp: 97.7 F (36.5 C) (10/06 1119) Temp Source: Oral (10/06 1119) BP: 139/97 (10/06 1119) Pulse Rate: 95 (10/06 1119)  Labs:  Recent Labs  02/25/16 0352  02/25/16 0850  02/26/16 0414 02/26/16 1140 02/27/16 0255 02/27/16 1111  HGB 9.4*  --   --   < > 9.4*  --  9.2*  --   HCT 30.6*  --   --   --  28.0*  --  27.0*  --   PLT 191  --   --   --  236  --  244  --   APTT  --   --   --   --   --   --   --  >160*  HEPARINUNFRC SPECIMEN GROSSLY LIPEMIC, UNABLE TO RESULT DUE TO INTERFERING SUBSTANCES  < > <0.10*  < >  --  0.15* 0.56 0.43  CREATININE  --   --  1.00  --  1.03*  --  1.22*  --   < > = values in this interval not displayed.  Estimated Creatinine Clearance: 49 mL/min (by C-G formula based on SCr of 1.22 mg/dL (H)).   Assessment: Pharmacy consulted to initiate heparin drip for 80 yo female being treated for possible SMA occulusion. Patient admitted with GI bleed. Patient with Afib not on chronic anticoagulation due to GI bleeds.  Goal of Therapy:  Heparin level 0.3-0.7 units/ml Monitor  platelets by anticoagulation protocol: Yes   Plan:  Current orders for heparin 2250 units/hr.   Have had difficulty maintaining therapeutic heparin levels in this patient. Question due to antithrombin III levels/heparin resistance and/or sampling error. ATIII level low, however this may be due to duration of heparin therapy at this point.   If levels continue to fluctuate, need to rule out improper sampling technique, possibly consider ATIII (Thrombate III) administration.   Current heparin level therapeutic x 2. Will continue current rate (despite elevated aPTT; as anti-Xa is much more specific). RN to monitor closely for signs of bleeding.   Recheck heparin level in 6 hours. Will continue frequent checks of heparin level due to difficulty maintaining therapeutic level. Repeat CBC with AM labs.   Garlon HatchetJody Analayah Brooke, PharmD Clinical Pharmacist  02/27/2016 1:55 PM

## 2016-02-27 NOTE — Progress Notes (Signed)
ANTICOAGULATION CONSULT NOTE - Follow Up Consult  Pharmacy Consult for Heparin Indication: SMA occlusion  Allergies  Allergen Reactions  . Demerol [Meperidine] Nausea And Vomiting  . Reglan [Metoclopramide] Swelling  . Penicillins Swelling and Rash    Swelling to arm at injection site. Has patient had a PCN reaction causing immediate rash, facial/tongue/throat swelling, SOB or lightheadedness with hypotension: Yes Has patient had a PCN reaction causing severe rash involving mucus membranes or skin necrosis: No Has patient had a PCN reaction that required hospitalization No Has patient had a PCN reaction occurring within the last 10 years: No If all of the above answers are "NO", then may proceed with Cephalosporin use.     Patient Measurements: Height: 5\' 7"  (170.2 cm) Weight: 246 lb 4.1 oz (111.7 kg) IBW/kg (Calculated) : 61.6 Heparin Dosing Weight: 95.5 kg  Vital Signs: Temp: 97.7 F (36.5 C) (10/06 1119) Temp Source: Oral (10/06 1119) BP: 134/68 (10/06 1742) Pulse Rate: 95 (10/06 1119)  Labs:  Recent Labs  02/25/16 0352  02/25/16 0850  02/26/16 0414  02/27/16 0255 02/27/16 1111 02/27/16 1805  HGB 9.4*  --   --   < > 9.4*  --  9.2*  --   --   HCT 30.6*  --   --   --  28.0*  --  27.0*  --   --   PLT 191  --   --   --  236  --  244  --   --   APTT  --   --   --   --   --   --   --  >160*  --   HEPARINUNFRC SPECIMEN GROSSLY LIPEMIC, UNABLE TO RESULT DUE TO INTERFERING SUBSTANCES  < > <0.10*  < >  --   < > 0.56 0.43 0.36  CREATININE  --   --  1.00  --  1.03*  --  1.22*  --   --   < > = values in this interval not displayed.  Estimated Creatinine Clearance: 49 mL/min (by C-G formula based on SCr of 1.22 mg/dL (H)).   Assessment: Pharmacy consulted to initiate heparin drip for 80 yo female being treated for possible SMA occulusion. Patient admitted with GI bleed. Patient with Afib not on chronic anticoagulation due to GI bleeds.  Goal of Therapy:  Heparin level  0.3-0.7 units/ml Monitor platelets by anticoagulation protocol: Yes   Plan:  Current orders for heparin 2250 units/hr.   Have had difficulty maintaining therapeutic heparin levels in this patient. Question due to antithrombin III levels/heparin resistance and/or sampling error. ATIII level low, however this may be due to duration of heparin therapy at this point.   If levels continue to fluctuate, need to rule out improper sampling technique, possibly consider ATIII (Thrombate III) administration.   Current heparin level therapeutic x 2. Will continue current rate (despite elevated aPTT; as anti-Xa is much more specific). RN to monitor closely for signs of bleeding.   Recheck heparin level in 6 hours. Will continue frequent checks of heparin level due to difficulty maintaining therapeutic level. Repeat CBC with AM labs.   10/6 1900: HL 0.36 - therapeutic. This is the third HL within range. Will continue to monitor Q6h due to difficulty maintaining therapeutic heparin level.   Rella LarveKelly Alford Gamero, PharmD 02/27/2016 6:54 PM

## 2016-02-27 NOTE — Progress Notes (Signed)
Pt refused CPAP. Pt has NG tube in her nose. NO distress noted.

## 2016-02-27 NOTE — Clinical Social Work Note (Signed)
Patient would like to go home with home health even though PT is recommending SNF.  MSW continuing to follow patient's progress in case patient and family change their mind and would like to go to SNF.  Ervin KnackEric R. Madelein Mahadeo, MSW [phone]  Mon-Fri 8a-4:30p 02/27/2016 5:18 PM

## 2016-02-27 NOTE — Progress Notes (Signed)
Second bottle of contrast mostly finished. Patient stated she was feeling full and didn't want to take the last 1/4 of the contrast - should be fine per CT. They should be here shortly to take her down.

## 2016-02-27 NOTE — Progress Notes (Addendum)
CRITICAL VALUE ALERT  Critical value received:  PTT  Date of notification:  02/27/16 Time of notification: 13:35  Critical value read back:Yes.    Nurse who received alert:  Gaye Alkenjancy Thula Stewart, rn  MD notified (1st page):  Dr. Juliene PinaMody  Time of first page:  1238 text page  MD notified (2nd page): Dr. Juliene PinaMody  Time of second page: 1258 Responding MD:    Time MD responded:

## 2016-02-27 NOTE — Progress Notes (Signed)
80 yr old female with SMA occlusion POD#5 from SMA thrombectomy.  Patient with some mild intermittent pain that is relieved with toradol.  She has NG tube in and is distended, denies passing any flatus.  Vitals:   02/27/16 1119 02/27/16 1742  BP: (!) 139/97 134/68  Pulse: 95   Resp: 16   Temp: 97.7 F (36.5 C)    I/O last 3 completed shifts: In: 4363 [I.V.:3963; IV Piggyback:400] Out: 1600 [Urine:1100; Emesis/NG output:500] Total I/O In: 2167.7 [I.V.:1467.7; NG/GT:400; IV Piggyback:300] Out: 350 [Urine:200; Emesis/NG output:150]   PE: Gen NAD Res: CTAB/L  CardIo: RRR Abd: soft, non-distended, currently non-tender Ext: no edema  CBC Latest Ref Rng & Units 02/27/2016 02/26/2016 02/25/2016  WBC 3.6 - 11.0 K/uL 6.2 6.3 -  Hemoglobin 12.0 - 16.0 g/dL 1.6(X9.2(L) 0.9(U9.4(L) 10.0(L)  Hematocrit 35.0 - 47.0 % 27.0(L) 28.0(L) -  Platelets 150 - 440 K/uL 244 236 -   CMP Latest Ref Rng & Units 02/27/2016 02/26/2016 02/25/2016  Glucose 65 - 99 mg/dL 045(W194(H) 098(J192(H) -  BUN 6 - 20 mg/dL 19(J26(H) 47(W26(H) -  Creatinine 0.44 - 1.00 mg/dL 2.95(A1.22(H) 2.13(Y1.03(H) -  Sodium 135 - 145 mmol/L 132(L) 134(L) -  Potassium 3.5 - 5.1 mmol/L 3.9 3.7 3.8  Chloride 101 - 111 mmol/L 102 105 -  CO2 22 - 32 mmol/L 25 24 -  Calcium 8.9 - 10.3 mg/dL 7.1(L) 7.0(L) -  Total Protein 6.5 - 8.1 g/dL - - -  Total Bilirubin 0.3 - 1.2 mg/dL - - -  Alkaline Phos 38 - 126 U/L - - -  AST 15 - 41 U/L - - -  ALT 14 - 54 U/L - - -    A/P:  80 yr old female with SMA occlusion POD# 5 from SMA thrombectomy.  Patient with minimal pain after SMA thrombectomy. Patient has ileus, labs continue to be normal.  Will order CT scan with contrast today to ensure improvement or help move bowels.

## 2016-02-27 NOTE — Progress Notes (Signed)
Patient complaining of worsening abdominal pain since having suction turned off and receiving contrast through NGT. IV Tylenol infusing now. Dr. Orvis BrillLoflin notified, instructed to turn suction back on to low-intermittent once patient gets back from CT scan. Called CT - they should be up to get her shortly.

## 2016-02-27 NOTE — Progress Notes (Signed)
PARENTERAL NUTRITION CONSULT NOTE - FOLLOW UP  Pharmacy Consult for TPN monitoring Indication: NPO  Allergies  Allergen Reactions  . Demerol [Meperidine] Nausea And Vomiting  . Reglan [Metoclopramide] Swelling  . Penicillins Swelling and Rash    Swelling to arm at injection site. Has patient had a PCN reaction causing immediate rash, facial/tongue/throat swelling, SOB or lightheadedness with hypotension: Yes Has patient had a PCN reaction causing severe rash involving mucus membranes or skin necrosis: No Has patient had a PCN reaction that required hospitalization No Has patient had a PCN reaction occurring within the last 10 years: No If all of the above answers are "NO", then may proceed with Cephalosporin use.     Patient Measurements: Height: 5\' 7"  (170.2 cm) Weight: 246 lb 4.1 oz (111.7 kg) IBW/kg (Calculated) : 61.6 Adjusted Body Weight: 92.36 kg  Vital Signs: Temp: 97.7 F (36.5 C) (10/06 1119) Temp Source: Oral (10/06 1119) BP: 139/97 (10/06 1119) Pulse Rate: 95 (10/06 1119) Intake/Output from previous day: 10/05 0701 - 10/06 0700 In: 2867.7 [I.V.:2767.7; IV Piggyback:100] Out: 1000 [Urine:650; Emesis/NG output:350] Intake/Output from this shift: Total I/O In: -  Out: 200 [Urine:200]  Labs:  Recent Labs  02/25/16 0352 02/25/16 1938 02/26/16 0414 02/27/16 0255 02/27/16 1111  WBC 6.4  --  6.3 6.2  --   HGB 9.4* 10.0* 9.4* 9.2*  --   HCT 30.6*  --  28.0* 27.0*  --   PLT 191  --  236 244  --   APTT  --   --   --   --  >160*     Recent Labs  02/25/16 0850 02/25/16 1938 02/26/16 0414 02/27/16 0255  NA 135  --  134* 132*  K 3.3* 3.8 3.7 3.9  CL 104  --  105 102  CO2 26  --  24 25  GLUCOSE 223*  --  192* 194*  BUN 24*  --  26* 26*  CREATININE 1.00  --  1.03* 1.22*  CALCIUM 6.9*  --  7.0* 7.1*  MG 1.8  --  1.9  --   PHOS 3.5  --   --   --   ALBUMIN  --   --  1.2*  --    Estimated Creatinine Clearance: 49 mL/min (by C-G formula based on SCr of  1.22 mg/dL (H)).    Recent Labs  02/27/16 0511 02/27/16 0721 02/27/16 1123  GLUCAP 179* 195* 209*    Medications:  Scheduled:  . bisacodyl  10 mg Rectal Daily  . ciprofloxacin  400 mg Intravenous BID  . digoxin  0.125 mg Intravenous Q48H  . digoxin  0.5 mg Intravenous Once  . insulin aspart  0-20 Units Subcutaneous Q4H  . insulin glargine  30 Units Subcutaneous Daily  . mouth rinse  15 mL Mouth Rinse BID  . metoprolol  5 mg Intravenous Q6H  . metronidazole  500 mg Intravenous Q8H  . pantoprazole (PROTONIX) IV  40 mg Intravenous Q12H  . sodium chloride flush  3 mL Intravenous Q12H   Infusions:  . sodium chloride 50 mL/hr at 02/27/16 1030  . amiodarone 30 mg/hr (02/27/16 1316)  . Marland KitchenTPN (CLINIMIX-E) Adult 70 mL/hr at 02/26/16 1823   And  . fat emulsion 500 mL (02/27/16 0509)  . heparin 2,250 Units/hr (02/27/16 1131)    Insulin Requirements in the past 24 hours:  Insulin glargine 30 units yesterday AM, SSI 27 units, 30 units of insulin added to TPN  Current Nutrition:  Clinimix 5/15  E at3370ml/hr and Lipids 20% at 6915ml/hr  Nutritional Goals:  1912 kCal, 84 grams of protein per day K > 4.0 Mg > 2.0 BG < 180  Plan:  Per RD, advancing TPN to 7385ml/hr and changing lipids to 3 times weekly (on Monday, Wednesday, and Friday to start 10/9)  Glucose control continues to improve. Expect that patient will require more insulin due to increased TPN rate. Will increase insulin in TPN to 38 units. Continue insulin glargine 30 units QAM and SSI (resistant scale).  Creatinine up, sodium trending down. MD added NS at 4650ml/hr. Will continue to monitor.   Potassium slightly low at 3.9. No additional supplementation today considering worsening renal function and increasing rate of TPN. Recheck electrolytes with AM labs.  Garlon HatchetJody Luvada Salamone, PharmD Clinical Pharmacist  02/27/2016 3:10 PM

## 2016-02-27 NOTE — Progress Notes (Signed)
[facility] - [place] [facility]   PATIENT NAME: Ardeth PerfectBobbie Scibilia    MR#:  [id number]  DATE OF BIRTH:  12/18/1935  SUBJECTIVE:   Patient Continues to have abdominal distention. She is not passing flatulus.    REVIEW OF SYSTEMS:    Review of Systems  Constitutional: Negative for chills, fever and malaise/fatigue.  HENT: Negative.  Negative for ear discharge, ear pain, hearing loss, nosebleeds and sore throat.   Eyes: Negative.  Negative for blurred vision and pain.  Respiratory: Negative.  Negative for cough, hemoptysis, shortness of breath and wheezing.   Cardiovascular: Negative.  Negative for chest pain, palpitations and leg swelling.  Gastrointestinal: Positive for abdominal pain. Negative for blood in stool, diarrhea, nausea and vomiting.  Genitourinary: Negative.  Negative for dysuria.  Musculoskeletal: Negative.  Negative for back pain.  Skin: Negative.   Neurological: Positive for weakness. Negative for dizziness, tremors, speech change, focal weakness, seizures and headaches.  Endo/Heme/Allergies: Negative.  Does not bruise/bleed easily.  Psychiatric/Behavioral: Negative.  Negative for depression, hallucinations and suicidal ideas.    Tolerating Diet: On TPN      DRUG ALLERGIES:   Allergies  Allergen Reactions  . Demerol [Meperidine] Nausea And Vomiting  . Reglan [Metoclopramide] Swelling  . Penicillins Swelling and Rash    Swelling to arm at injection site. Has patient had a PCN reaction causing immediate rash, facial/tongue/throat swelling, SOB or lightheadedness with hypotension: Yes Has patient had a PCN reaction causing severe rash involving mucus membranes or skin necrosis: No Has patient had a PCN reaction that required hospitalization No Has patient had a PCN reaction occurring within the last 10 years: No If all of the above answers are "NO", then may proceed with Cephalosporin use.     VITALS:  Blood pressure (!) 139/97, pulse 95,  temperature 97.7 F (36.5 C), temperature source Oral, resp. rate 16, height 5\' 7"  (1.702 m), weight 111.7 kg (246 lb 4.1 oz), SpO2 100 %.  PHYSICAL EXAMINATION:   Physical Exam  Constitutional: She is oriented to person, place, and time and well-developed, well-nourished, and in no distress. No distress.  HENT:  Head: Normocephalic.  Eyes: No scleral icterus.  Neck: Normal range of motion. Neck supple. No JVD present. No tracheal deviation present.  Cardiovascular: Normal rate, regular rhythm and normal heart sounds.  Exam reveals no gallop and no friction rub.   No murmur heard. Pulmonary/Chest: Effort normal and breath sounds normal. No respiratory distress. She has no wheezes. She has no rales. She exhibits no tenderness.  Abdominal: She exhibits distension. She exhibits no mass. There is no rebound and no guarding.  Hypoactive bowel sounds  Musculoskeletal: Normal range of motion. She exhibits edema.  Neurological: She is alert and oriented to person, place, and time.  Skin: Skin is warm. No rash noted. No erythema.  Psychiatric: Affect and judgment normal.      LABORATORY PANEL:   CBC  Recent Labs Lab 02/27/16 0255  WBC 6.2  HGB 9.2*  HCT 27.0*  PLT 244   ------------------------------------------------------------------------------------------------------------------  Chemistries   Recent Labs Lab 03/07/2016 1221  02/26/16 0414 02/27/16 0255  NA  --   < > 134* 132*  K  --   < > 3.7 3.9  CL  --   < > 105 102  CO2  --   < > 24 25  GLUCOSE  --   < > 192* 194*  BUN  --   < > 26* 26*  CREATININE  --   < >  1.03* 1.22*  CALCIUM  --   < > 7.0* 7.1*  MG  --   < > 1.9  --   AST 12*  --   --   --   ALT 8*  --   --   --   ALKPHOS 49  --   --   --   BILITOT 0.8  --   --   --   < > = values in this interval not displayed. ------------------------------------------------------------------------------------------------------------------  Cardiac Enzymes No results  for input(s): TROPONINI in the last 168 hours. ------------------------------------------------------------------------------------------------------------------  RADIOLOGY:  Dg Abd 1 View  Result Date: 02/26/2016 CLINICAL DATA:  80 year old female with a history of small bowel obstruction EXAM: ABDOMEN - 1 VIEW COMPARISON:  Plain film 02/25/2016, 02/23/2016, CT 03/19/2016 FINDINGS: Gastric tube terminates within the stomach. Unchanged gas pattern with dilated small bowel loops. Absence of colonic gas. Degenerative changes of the spine. IMPRESSION: Persisting small bowel obstruction with absence of colonic gas. Unchanged gastric tube. Signed, Yvone Neu. Loreta Ave, DO [facility] Radiology Electronically Signed   By: Gilmer Mor D.O.   On: 02/26/2016 08:58     ASSESSMENT AND PLAN:    80 year old female with chronic atrial fibrillation who presented with acute onset of abdominal pain and found to have SMA occlusion.  1. GI bleed: This is due to ischemic etiology of SMA occlusion. Hemoglobin has remained stable NO Indication for blood transfusion   2. SMA occlusion: Postoperative #5 status post SMA thrombectomy Appreciate vascular And surgery consultation. Continue ciprofloxacin and Flagyl. Continue heparin drip for now. 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 Consults oncology for further evaluation and need to start anti-thrombin 3.   3. Ileus: This is due to problem #2. Discussed again with surgeon as patient looks more distended. Plan for CT scan with Gastrografin today to evaluate for small bowel obstruction. Keep NG tube suction Continue TPN Avoid narcotics if possible.  4. Atrial fibrillation with RVR: Heart rate is better controlled.  Continue amiodarone drip Continue when necessary diltiazem Continue digoxin Continue metoprolol  5. Nutrition: Continue TPN and  electrolyte management as per pharmacy.  6. Diabetes: Continue sliding scale insulin with Lantus.  Discussed with surgeon  rounded with nursing staff today. Management plans discussed with the patient and family and she is in agreement.  CODE STATUS: full  TOTAL TIME TAKING CARE OF THIS PATIENT: 28 minutes.     POSSIBLE D/C ??, DEPENDING ON CLINICAL CONDITION.   Francess Mullen M.D on 02/27/2016 at 1:03 PM  Between 7am to 6pm - Pager - [phone] After 6pm go to [email or web address] - Social research officer, government  [facility]  Office  [phone]  CC: Primary care physician; No PCP Per Patient  Note: This dictation was prepared with Dragon dictation along with smaller phrase technology. Any transcriptional errors that result from this process are unintentional.

## 2016-02-27 NOTE — Progress Notes (Signed)
[facility]  Daily Progress Note   Subjective: 5 Days Post-Op: Ultrasound guidance for vascular access right femoral artery, Catheter placement into SMA from right femoral approach, Aortogram and selective SMA angiogram,  Mechanical thrombectomy with the AngioJet Omni 120, Percutaneous transluminal angioplasty SMA with a 5 x 6 Lutonix balloon with StarClose closure device right femoral artery.   Patient denies any bowel movement or flatus. Denies any nausea or vomiting over NG tube. Experiencing some mild abdominal pain. Patient minimally ambulating.   Objective: Vitals:   02/26/16 1953 02/27/16 0048 02/27/16 0514 02/27/16 0750  BP: (!) 108/47 (!) 122/58 121/80 (!) 114/51  Pulse: (!) 107 100 98 75  Resp: 15  15   Temp: 97.9 F (36.6 C)  97.7 F (36.5 C)   TempSrc: Oral  Oral   SpO2: 100% 97% 100%   Weight:   246 lb 4.1 oz (111.7 kg)   Height:        Intake/Output Summary (Last 24 hours) at 02/27/16 1051 Last data filed at 02/27/16 0600  Gross per 24 hour  Intake           2867.7 ml  Output             1000 ml  Net           1867.7 ml   Physical Exam: A&Ox3, NAD CV: Irregularly Irregular Pulmonary: CTA Bilaterally Abdomen: NG in place, sumping, draining bilious fluid. Soft, moderate distention, hypoactive bowel sounds Right Groin: Removed OR dressing: Incision clean, dry and intact.  Vascular: Bilateral Extremity - warm, non-tender, moderate edema   Laboratory: CBC    Component Value Date/Time   WBC 6.2 02/27/2016 0255   HGB 9.2 (L) 02/27/2016 0255   HGB 11.4 (L) 07/10/2013 0418   HCT 27.0 (L) 02/27/2016 0255   HCT 34.1 (L) 07/10/2013 0418   PLT 244 02/27/2016 0255   PLT 301 07/10/2013 0418   BMET    Component Value Date/Time   NA 132 (L) 02/27/2016 0255   NA 142 07/10/2013 0418   K 3.9 02/27/2016 0255   K 4.5 07/10/2013 0418   CL 102 02/27/2016 0255   CL 113 (H) 07/10/2013 0418   CO2 25 02/27/2016 0255   CO2 23 07/10/2013 0418   GLUCOSE 194 (H) 02/27/2016 0255   GLUCOSE 149 (H) 07/10/2013 0418   BUN 26 (H) 02/27/2016 0255   BUN 12 07/10/2013 0418   CREATININE 1.22 (H) 02/27/2016 0255   CREATININE 1.18 07/10/2013 0418   CALCIUM 7.1 (L) 02/27/2016 0255   CALCIUM 8.6 07/10/2013 0418   GFRNONAA 41 (L) 02/27/2016 0255   GFRNONAA 45 (L) 07/10/2013 0418   GFRAA 48 (L) 02/27/2016 0255   GFRAA 52 (L) 07/10/2013 0418   Assessment/Planning: 80 year old female s/p mesenteric angiogram for SMA occlusion POD #5 - Stable, still with ileus 1) Ileus: Continue heparin gtt until bowel function returns from vascular surgery standpoint.  2) Vascular Access Point: healing well. No signs of infection or swelling noted.  3) Will continue to follow.  Cleda DaubKimberly Miracle Mongillo PA-C 02/27/2016 10:51 AM

## 2016-02-27 NOTE — Progress Notes (Signed)
Pharmacist called this RN regarding APTT and Hep levels. Keeping heparin gtt at same rate, but will monitor closely for any signs of bleeding.

## 2016-02-28 ENCOUNTER — Inpatient Hospital Stay: Payer: Commercial Managed Care - HMO

## 2016-02-28 LAB — EXPECTORATED SPUTUM ASSESSMENT W REFEX TO RESP CULTURE

## 2016-02-28 LAB — PROTIME-INR
INR: 1.29
Prothrombin Time: 16.2 seconds — ABNORMAL HIGH (ref 11.4–15.2)

## 2016-02-28 LAB — CBC
HCT: 32.5 % — ABNORMAL LOW (ref 35.0–47.0)
Hemoglobin: 11 g/dL — ABNORMAL LOW (ref 12.0–16.0)
MCH: 30.9 pg (ref 26.0–34.0)
MCHC: 33.9 g/dL (ref 32.0–36.0)
MCV: 91.2 fL (ref 80.0–100.0)
PLATELETS: 324 10*3/uL (ref 150–440)
RBC: 3.56 MIL/uL — ABNORMAL LOW (ref 3.80–5.20)
RDW: 14.9 % — AB (ref 11.5–14.5)
WBC: 8.9 10*3/uL (ref 3.6–11.0)

## 2016-02-28 LAB — BASIC METABOLIC PANEL
Anion gap: 8 (ref 5–15)
BUN: 32 mg/dL — AB (ref 6–20)
CALCIUM: 7.4 mg/dL — AB (ref 8.9–10.3)
CHLORIDE: 101 mmol/L (ref 101–111)
CO2: 20 mmol/L — AB (ref 22–32)
CREATININE: 1.44 mg/dL — AB (ref 0.44–1.00)
GFR calc Af Amer: 39 mL/min — ABNORMAL LOW (ref >60)
GFR calc non Af Amer: 34 mL/min — ABNORMAL LOW (ref >60)
Glucose, Bld: 185 mg/dL — ABNORMAL HIGH (ref 65–99)
Potassium: 4.6 mmol/L (ref 3.5–5.1)
Sodium: 129 mmol/L — ABNORMAL LOW (ref 135–145)

## 2016-02-28 LAB — GLUCOSE, CAPILLARY
GLUCOSE-CAPILLARY: 177 mg/dL — AB (ref 65–99)
GLUCOSE-CAPILLARY: 182 mg/dL — AB (ref 65–99)
GLUCOSE-CAPILLARY: 192 mg/dL — AB (ref 65–99)
GLUCOSE-CAPILLARY: 207 mg/dL — AB (ref 65–99)
GLUCOSE-CAPILLARY: 222 mg/dL — AB (ref 65–99)
GLUCOSE-CAPILLARY: 248 mg/dL — AB (ref 65–99)
Glucose-Capillary: 207 mg/dL — ABNORMAL HIGH (ref 65–99)
Glucose-Capillary: 213 mg/dL — ABNORMAL HIGH (ref 65–99)

## 2016-02-28 LAB — PHOSPHORUS: Phosphorus: 6.2 mg/dL — ABNORMAL HIGH (ref 2.5–4.6)

## 2016-02-28 LAB — BLOOD GAS, ARTERIAL
ACID-BASE DEFICIT: 8.2 mmol/L — AB (ref 0.0–2.0)
BICARBONATE: 17.9 mmol/L — AB (ref 20.0–28.0)
FIO2: 1
O2 SAT: 99.8 %
PCO2 ART: 38 mmHg (ref 32.0–48.0)
PEEP/CPAP: 5 cmH2O
PH ART: 7.28 — AB (ref 7.350–7.450)
Patient temperature: 37
RATE: 18 resp/min
VT: 500 mL
pO2, Arterial: 235 mmHg — ABNORMAL HIGH (ref 83.0–108.0)

## 2016-02-28 LAB — PROCALCITONIN: PROCALCITONIN: 6.97 ng/mL

## 2016-02-28 LAB — TROPONIN I
Troponin I: 0.05 ng/mL (ref <0.03)
Troponin I: <0.03 ng/mL (ref <0.03)
Troponin I: <0.03 ng/mL (ref <0.03)

## 2016-02-28 LAB — MRSA PCR SCREENING: MRSA BY PCR: NEGATIVE

## 2016-02-28 LAB — HEPARIN LEVEL (UNFRACTIONATED)
HEPARIN UNFRACTIONATED: 0.54 [IU]/mL (ref 0.30–0.70)
HEPARIN UNFRACTIONATED: 0.58 [IU]/mL (ref 0.30–0.70)
HEPARIN UNFRACTIONATED: 0.55 [IU]/mL (ref 0.30–0.70)
Heparin Unfractionated: 0.68 IU/mL (ref 0.30–0.70)

## 2016-02-28 LAB — LACTIC ACID, PLASMA
LACTIC ACID, VENOUS: 2.5 mmol/L — AB (ref 0.5–1.9)
LACTIC ACID, VENOUS: 2.9 mmol/L — AB (ref 0.5–1.9)

## 2016-02-28 LAB — TRIGLYCERIDES: Triglycerides: 38 mg/dL (ref <150)

## 2016-02-28 LAB — MAGNESIUM: MAGNESIUM: 2.2 mg/dL (ref 1.7–2.4)

## 2016-02-28 MED ORDER — CHLORHEXIDINE GLUCONATE 0.12% ORAL RINSE (MEDLINE KIT)
15.0000 mL | Freq: Two times a day (BID) | OROMUCOSAL | Status: DC
  Administered 2016-02-28 – 2016-02-29 (×3): 15 mL via OROMUCOSAL

## 2016-02-28 MED ORDER — FAMOTIDINE IN NACL 20-0.9 MG/50ML-% IV SOLN
20.0000 mg | Freq: Two times a day (BID) | INTRAVENOUS | Status: DC
  Administered 2016-02-28 – 2016-02-29 (×4): 20 mg via INTRAVENOUS
  Filled 2016-02-28 (×4): qty 50

## 2016-02-28 MED ORDER — VANCOMYCIN HCL IN DEXTROSE 1-5 GM/200ML-% IV SOLN
1000.0000 mg | Freq: Once | INTRAVENOUS | Status: AC
  Administered 2016-02-28: 1000 mg via INTRAVENOUS
  Filled 2016-02-28: qty 200

## 2016-02-28 MED ORDER — ORAL CARE MOUTH RINSE
15.0000 mL | OROMUCOSAL | Status: DC
  Administered 2016-02-28 – 2016-02-29 (×13): 15 mL via OROMUCOSAL

## 2016-02-28 MED ORDER — SODIUM CHLORIDE 0.9 % IV BOLUS (SEPSIS)
1000.0000 mL | Freq: Once | INTRAVENOUS | Status: AC
  Administered 2016-02-28: 1000 mL via INTRAVENOUS

## 2016-02-28 MED ORDER — NOREPINEPHRINE 4 MG/250ML-% IV SOLN
2.0000 ug/min | INTRAVENOUS | Status: DC
  Administered 2016-02-28: 10 ug/min via INTRAVENOUS
  Filled 2016-02-28: qty 250

## 2016-02-28 MED ORDER — NOREPINEPHRINE BITARTRATE 1 MG/ML IV SOLN
0.0000 ug/min | INTRAVENOUS | Status: DC
  Administered 2016-02-28: 26 ug/min via INTRAVENOUS
  Administered 2016-02-28: 18 ug/min via INTRAVENOUS
  Administered 2016-02-29: 46 ug/min via INTRAVENOUS
  Administered 2016-02-29: 53 ug/min via INTRAVENOUS
  Administered 2016-02-29: 30 ug/min via INTRAVENOUS
  Administered 2016-02-29: 62 ug/min via INTRAVENOUS
  Filled 2016-02-28 (×5): qty 16

## 2016-02-28 MED ORDER — SENNOSIDES 8.8 MG/5ML PO SYRP: 5.0000 mL | ORAL_SOLUTION | Freq: Two times a day (BID) | ORAL | Status: DC | PRN

## 2016-02-28 MED ORDER — FENTANYL CITRATE (PF) 100 MCG/2ML IJ SOLN
50.0000 ug | Freq: Once | INTRAMUSCULAR | Status: AC
Start: 2016-02-28 — End: 2016-02-28
  Administered 2016-02-28: 50 ug via INTRAVENOUS
  Filled 2016-02-28: qty 2

## 2016-02-28 MED ORDER — SODIUM CHLORIDE 0.9% FLUSH
10.0000 mL | INTRAVENOUS | Status: DC | PRN
  Administered 2016-02-28: 20 mL
  Filled 2016-02-28: qty 40

## 2016-02-28 MED ORDER — VANCOMYCIN HCL IN DEXTROSE 1-5 GM/200ML-% IV SOLN
1000.0000 mg | INTRAVENOUS | Status: DC
  Administered 2016-02-28 – 2016-02-29 (×2): 1000 mg via INTRAVENOUS
  Filled 2016-02-28 (×3): qty 200

## 2016-02-28 MED ORDER — TRACE MINERALS CR-CU-MN-SE-ZN 10-1000-500-60 MCG/ML IV SOLN
INTRAVENOUS | Status: DC
  Administered 2016-02-28: 19:00:00 via INTRAVENOUS
  Filled 2016-02-28: qty 2040

## 2016-02-28 MED ORDER — PIPERACILLIN-TAZOBACTAM 4.5 G IVPB
4.5000 g | Freq: Three times a day (TID) | INTRAVENOUS | Status: DC
  Administered 2016-02-28: 4.5 g via INTRAVENOUS
  Filled 2016-02-28 (×4): qty 100

## 2016-02-28 MED ORDER — MIDAZOLAM HCL 2 MG/2ML IJ SOLN: 1.0000 mg | INTRAMUSCULAR | Status: DC | PRN

## 2016-02-28 MED ORDER — NOREPINEPHRINE BITARTRATE 1 MG/ML IV SOLN: 2.0000 ug/min | INTRAVENOUS | Status: DC

## 2016-02-28 MED ORDER — POLYETHYLENE GLYCOL 3350 17 G PO PACK
17.0000 g | PACK | Freq: Every day | ORAL | Status: DC
  Administered 2016-02-28: 17 g
  Filled 2016-02-28: qty 1

## 2016-02-28 MED ORDER — SODIUM CHLORIDE 0.9% FLUSH
10.0000 mL | Freq: Two times a day (BID) | INTRAVENOUS | Status: DC
  Administered 2016-02-28: 10 mL
  Administered 2016-02-28: 30 mL
  Administered 2016-02-28: 20 mL
  Administered 2016-02-29: 10 mL

## 2016-02-28 MED ORDER — FUROSEMIDE 10 MG/ML IJ SOLN
40.0000 mg | Freq: Once | INTRAMUSCULAR | Status: AC
  Administered 2016-02-28: 40 mg via INTRAVENOUS
  Filled 2016-02-28: qty 4

## 2016-02-28 MED ORDER — PIPERACILLIN-TAZOBACTAM 3.375 G IVPB: 3.3750 g | Freq: Three times a day (TID) | INTRAVENOUS | Status: DC

## 2016-02-28 MED ORDER — FENTANYL 2500MCG IN NS 250ML (10MCG/ML) PREMIX INFUSION
25.0000 ug/h | INTRAVENOUS | Status: DC
  Administered 2016-02-28: 50 ug/h via INTRAVENOUS
  Administered 2016-02-29: 150 ug/h via INTRAVENOUS
  Filled 2016-02-28 (×2): qty 250

## 2016-02-28 MED ORDER — SODIUM BICARBONATE 8.4 % IV SOLN
150.0000 meq | Freq: Once | INTRAVENOUS | Status: AC
  Administered 2016-02-28: 150 meq via INTRAVENOUS
  Filled 2016-02-28: qty 150

## 2016-02-28 MED ORDER — FENTANYL BOLUS VIA INFUSION
25.0000 ug | INTRAVENOUS | Status: DC | PRN
  Administered 2016-02-28 – 2016-02-29 (×4): 25 ug via INTRAVENOUS
  Filled 2016-02-28: qty 25

## 2016-02-28 MED ORDER — MIDAZOLAM HCL 2 MG/2ML IJ SOLN
1.0000 mg | INTRAMUSCULAR | Status: DC | PRN
  Administered 2016-02-28 – 2016-02-29 (×2): 1 mg via INTRAVENOUS
  Filled 2016-02-28 (×2): qty 2

## 2016-02-28 MED FILL — Medication: Qty: 1 | Status: AC

## 2016-02-28 NOTE — Progress Notes (Addendum)
ANTICOAGULATION CONSULT NOTE - Follow Up Consult  Pharmacy Consult for Heparin Indication: SMA occlusion  Allergies  Allergen Reactions  . Demerol [Meperidine] Nausea And Vomiting  . Reglan [Metoclopramide] Swelling  . Penicillins Swelling and Rash    Swelling to arm at injection site. Has patient had a PCN reaction causing immediate rash, facial/tongue/throat swelling, SOB or lightheadedness with hypotension: Yes Has patient had a PCN reaction causing severe rash involving mucus membranes or skin necrosis: No Has patient had a PCN reaction that required hospitalization No Has patient had a PCN reaction occurring within the last 10 years: No If all of the above answers are "NO", then may proceed with Cephalosporin use.     Patient Measurements: Height: 5\' 7"  (170.2 cm) Weight: 246 lb 4.1 oz (111.7 kg) IBW/kg (Calculated) : 61.6 Heparin Dosing Weight: 95.5 kg  Vital Signs: Temp: 97.5 F (36.4 C) (10/06 2311) Temp Source: Oral (10/06 2311) BP: 83/52 (10/07 0129) Pulse Rate: 81 (10/07 0129)  Labs:  Recent Labs  02/25/16 0352  02/25/16 0850  02/26/16 0414  02/27/16 0255 02/27/16 1111 02/27/16 1805 02/28/16 0020  HGB 9.4*  --   --   < > 9.4*  --  9.2*  --   --   --   HCT 30.6*  --   --   --  28.0*  --  27.0*  --   --   --   PLT 191  --   --   --  236  --  244  --   --   --   APTT  --   --   --   --   --   --   --  >160*  --   --   HEPARINUNFRC SPECIMEN GROSSLY LIPEMIC, UNABLE TO RESULT DUE TO INTERFERING SUBSTANCES  < > <0.10*  < >  --   < > 0.56 0.43 0.36 0.55  CREATININE  --   --  1.00  --  1.03*  --  1.22*  --   --   --   < > = values in this interval not displayed.  Estimated Creatinine Clearance: 49 mL/min (by C-G formula based on SCr of 1.22 mg/dL (H)).   Assessment: Pharmacy consulted to initiate heparin drip for 80 yo female being treated for possible SMA occulusion. Patient admitted with GI bleed. Patient with Afib not on chronic anticoagulation due to GI  bleeds.  Goal of Therapy:  Heparin level 0.3-0.7 units/ml Monitor platelets by anticoagulation protocol: Yes   Plan:  Current orders for heparin 2250 units/hr.   Have had difficulty maintaining therapeutic heparin levels in this patient. Question due to antithrombin III levels/heparin resistance and/or sampling error. ATIII level low, however this may be due to duration of heparin therapy at this point.   If levels continue to fluctuate, need to rule out improper sampling technique, possibly consider ATIII (Thrombate III) administration.   Current heparin level therapeutic x 2. Will continue current rate (despite elevated aPTT; as anti-Xa is much more specific). RN to monitor closely for signs of bleeding.   Recheck heparin level in 6 hours. Will continue frequent checks of heparin level due to difficulty maintaining therapeutic level. Repeat CBC with AM labs.   10/6 1900: HL 0.36 - therapeutic. This is the third HL within range. Will continue to monitor Q6h due to difficulty maintaining therapeutic heparin level.   10/7 00:00 heparin level 0.55. Continue current regimen. Recheck in 6 hours.  10/7 06:30 heparin level  0.54. Continue current regimen. Recheck in 6 hours.     Rella LarveKelly Fuhrmann, PharmD 02/28/2016 2:02 AM

## 2016-02-28 NOTE — Progress Notes (Signed)
Pt's output has been low today. Since 0700 pt has put out a total of 225mls. [facility] paged and per Dr. Arsenio LoaderSommer will set-up and monitor CVPs via pt's PICC line. Initial CVP 11. [facility] recalled with intital CVP value.

## 2016-02-28 NOTE — Progress Notes (Signed)
Pharmacy Antibiotic Note  [name] is a 80 y.o. female admitted on 10/20/15 with sepsis/possible aspiration pneumonia.  Pharmacy has been consulted for vancomycin and Zosyn dosing.  Plan: DW 82kg  Vd 57 kei 0.039 hr-1  T1/2 18 hours Vancomycin 1 gram q 18 hours ordered with stacked dosing. Level before 5th dose. Goal trough 15-20.    Zosyn 4.5 grams q 8 hours ordered for Pseudomonas risk of >5 days hospitalization and previous abx usage.  Height: 5\' 7"  (170.2 cm) Weight: 246 lb 4.1 oz (111.7 kg) IBW/kg (Calculated) : 61.6  Temp (24hrs), Avg:97.4 F (36.3 C), Min:95.9 F (35.5 C), Max:98.4 F (36.9 C)   Recent Labs Lab 03/09/2016 1031  02/24/16 0420 02/25/16 0352 02/25/16 0850 02/26/16 0414 02/27/16 0255 02/28/16 0226 02/28/16 0236  WBC  --   < > 7.7 6.4  --  6.3 6.2  --  8.9  CREATININE  --   < > 0.97  --  1.00 1.03* 1.22*  --  1.44*  LATICACIDVEN 0.9  --   --   --   --   --   --  2.5*  --   < > = values in this interval not displayed.  Estimated Creatinine Clearance: 41.5 mL/min (by C-G formula based on SCr of 1.44 mg/dL (H)).    Allergies  Allergen Reactions  . Demerol [Meperidine] Nausea And Vomiting  . Reglan [Metoclopramide] Swelling  . Penicillins Swelling and Rash    Swelling to arm at injection site. Has patient had a PCN reaction causing immediate rash, facial/tongue/throat swelling, SOB or lightheadedness with hypotension: Yes Has patient had a PCN reaction causing severe rash involving mucus membranes or skin necrosis: No Has patient had a PCN reaction that required hospitalization No Has patient had a PCN reaction occurring within the last 10 years: No If all of the above answers are "NO", then may proceed with Cephalosporin use.    Patent has tolerated Zosyn earlier in this admission.   Antimicrobials this admission: ciprofloxacin metronidazole >>  vancomycin Zosyn >>   Dose adjustments this admission:   Microbiology results: 10/7 BCx:  pending 10/7 UCx: pending  10/7 Sputum: pending  10/7 MRSA PCR: pending    Thank you for allowing pharmacy to be a part of this patient's care.  Gabbriella Presswood S 02/28/2016 5:42 AM

## 2016-02-28 NOTE — Progress Notes (Signed)
Pt's LA level elevated at 2.9 this AM. Dr. Belia HemanKasa notified. No new orders at this time.

## 2016-02-28 NOTE — Progress Notes (Signed)
Pt. Arrived from 2-A with dopamine gtt running @ 15 mcg. Pt. On Ventilator, NGT to suction. Re[place], [place] Will take over care at this time

## 2016-02-28 NOTE — Consult Note (Signed)
PULMONARY / CRITICAL CARE MEDICINE   Name: [name] MRN: [id number] DOB: [birth year]    ADMISSION DATE:  02/21/2016   CONSULTATION DATE: 02/28/2016  REFERRING MD: Dr. Oralia Manis  CHIEF COMPLAINT: Agonal breathing and hypoxia  HISTORY OF PRESENT ILLNESS:   This is a 80 y/o Caucasian female with a PMH of hypertension, DM, and Afib who initially presented on  01/29/2016 with coffee ground emesis, diarrhea, hematochezia and abdominal pain. In the ED, she was found to be in afib with rvr.  She was admitted with acute GI bleed and afib with RVR by the Hospitalist service and started on diltiazem, protonix, iv fluids and GI was consulted. A CT abdomen showed a small bowel obstruction and surgery was consulted. An NGT was placed to suction and a repeat CT abdomen showed a superior mesenteric artery occlusion. Patient was seen by vascular and underwent a thrombectomy/embolization of the superior mesenteric artery on 03/18/16. At about 4 PM on 02/27/2016, patient started complaining of worsening abdominal pain. NG tube was placed to low intermittent suction. At about 11:30 PM, patient started complaining of shortness of breath. She was placed on 2 L nasal cannula of oxygen and a chest x-ray was obtained. At about 2 AM. Patient was found with agonal breathing with a respiratory rate of about 6 breaths per minute. A CODE BLUE was called, but no CPR was performed as patient had a pulse. BMV ventilation was initiated and patient was emergently intubated and transferred to the intensive care unit. Per patient's family, patient has been complaining of shortness of breath during the course of the day. Her abdomen remains very distended and her NG output is still profound at approximately 350 mL; output is mainly bilious in nature. She was on TPN, amiodarone for A. fib with RVR and heparin for anticoagulation post SMA embolization  PAST MEDICAL HISTORY :  She  has a past medical history of Atrial  fibrillation (HCC); Diabetes mellitus without complication (HCC); and Hypertension.  PAST SURGICAL HISTORY: She  has a past surgical history that includes Abdominal hysterectomy and Cardiac catheterization (N/A, 18-Mar-2016).  Allergies  Allergen Reactions  . Demerol [Meperidine] Nausea And Vomiting  . Reglan [Metoclopramide] Swelling  . Penicillins Swelling and Rash    Swelling to arm at injection site. Has patient had a PCN reaction causing immediate rash, facial/tongue/throat swelling, SOB or lightheadedness with hypotension: Yes Has patient had a PCN reaction causing severe rash involving mucus membranes or skin necrosis: No Has patient had a PCN reaction that required hospitalization No Has patient had a PCN reaction occurring within the last 10 years: No If all of the above answers are "NO", then may proceed with Cephalosporin use.     No current facility-administered medications on file prior to encounter.    No current outpatient prescriptions on file prior to encounter.    FAMILY HISTORY:  Her indicated that the status of her mother is unknown. She indicated that the status of her father is unknown.    SOCIAL HISTORY: She  reports that she has never smoked. She has never used smokeless tobacco. She reports that she does not drink alcohol or use drugs.  REVIEW OF SYSTEMS:   Above to obtain as patient is currently obtunded   SUBJECTIVE:   VITAL SIGNS: BP (!) 83/52   Pulse 81   Temp 97.5 F (36.4 C) (Oral)   Resp 20   Ht 5\' 7"  (1.702 m)   Wt 246 lb 4.1 oz (111.7 kg)  SpO2 100%   BMI 38.57 kg/m   HEMODYNAMICS:    VENTILATOR SETTINGS: Vent Mode: PRVC FiO2 (%):  [100 %] 100 % Set Rate:  [18 bmp] 18 bmp Vt Set:  [500 mL] 500 mL PEEP:  [5 cmH20] 5 cmH20  INTAKE / OUTPUT: I/O last 3 completed shifts: In: 5642.8 [I.V.:4792.8; NG/GT:450; IV Piggyback:400] Out: 1350 [Urine:850; Emesis/NG output:500]  PHYSICAL EXAMINATION: General: Obese, pale looking  female, appears older for age  Neuro: Withdraws to pain, not following commands  HEENT:  Normocephalic and atraumatic, PERRLA, trachea midline, neck is short Cardiovascular: Rate and rhythm irregular, S1, S2 audible, no murmur or regurg Lungs: Normal work of breathing, intubated, bilateral airflow, positive rhonchi in anterior lung fields, no wheezing  Abdomen: Distended, no bowel sounds, tympanic on percussion, firm on palpation  Musculoskeletal:  No visible joint deformities, positive range of motion Extremities: +2 pulses bilaterally, +1 edema, mild venostasis discoloration  Skin:  Cyanotic extremities and lips, no rash or lesions  LABS:  BMET  Recent Labs Lab 02/25/16 0850 02/25/16 1938 02/26/16 0414 02/27/16 0255  NA 135  --  134* 132*  K 3.3* 3.8 3.7 3.9  CL 104  --  105 102  CO2 26  --  24 25  BUN 24*  --  26* 26*  CREATININE 1.00  --  1.03* 1.22*  GLUCOSE 223*  --  192* 194*    Electrolytes  Recent Labs Lab 02/23/16 0515 02/24/16 0420 02/25/16 0850 02/26/16 0414 02/27/16 0255  CALCIUM 6.7* 6.6* 6.9* 7.0* 7.1*  MG 1.8 1.8 1.8 1.9  --   PHOS 3.8 3.1 3.5  --   --     CBC  Recent Labs Lab 02/25/16 0352 02/25/16 1938 02/26/16 0414 02/27/16 0255  WBC 6.4  --  6.3 6.2  HGB 9.4* 10.0* 9.4* 9.2*  HCT 30.6*  --  28.0* 27.0*  PLT 191  --  236 244    Coag's  Recent Labs Lab 02/21/16 1155 02/27/16 1111  APTT 29 >160*  INR 1.18  --     Sepsis Markers  Recent Labs Lab 03-09-2016 1031  LATICACIDVEN 0.9    ABG No results for input(s): PHART, PCO2ART, PO2ART in the last 168 hours.  Liver Enzymes  Recent Labs Lab 03/09/2016 1221 02/23/16 0515 02/26/16 0414  AST 12*  --   --   ALT 8*  --   --   ALKPHOS 49  --   --   BILITOT 0.8  --   --   ALBUMIN 1.5* 1.4* 1.2*    Cardiac Enzymes No results for input(s): TROPONINI, PROBNP in the last 168 hours.  Glucose  Recent Labs Lab 02/27/16 0721 02/27/16 1123 02/27/16 1643 02/27/16 2112  02/28/16 0033 02/28/16 0206  GLUCAP 195* 209* 145* 128* 192* 177*    Imaging Ct Abdomen Pelvis W Contrast  Result Date: 02/27/2016 CLINICAL DATA:  Persistent and progressive abdominal pain. 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 EXAM: CT ABDOMEN AND PELVIS WITH CONTRAST TECHNIQUE: Multidetector CT imaging of the abdomen and pelvis was performed using the standard protocol following bolus administration of intravenous contrast. Oral contrast was also administered. CONTRAST:  75mL ISOVUE-300 IOPAMIDOL (ISOVUE-300) INJECTION 61% COMPARISON:  03/09/16 FINDINGS: Lower chest: There are persistent pleural effusions with bibasilar consolidation. Pericardium is not appreciably thickened. There is a hiatal hernia. Hepatobiliary: No focal liver lesions are appreciable. There is sludge in the gallbladder. There also appears to be vicarious excretion of intravenous contrast into the gallbladder.  The gallbladder wall does not appear thickened. There is no biliary duct dilatation. Pancreas: There is no pancreatic mass or inflammatory focus. Spleen: No splenic lesions evident. 1 cm calcified splenic artery aneurysm is stable, not felt to have significance in this age group. Adrenals/Urinary Tract: Adrenals appear unremarkable bilaterally. There is fairly extensive renal sinus fat bilaterally. There is no renal mass or hydronephrosis on either side. No renal or ureteral calculus evident. Urinary bladder is midline with wall thickness within normal limits. Stomach/Bowel: There remains dilated with a relative transition zone in the right mid abdomen. This appearance is similar to 5 days prior. There remains inflammatory change in the ascending colon with mesenteric thickening. There are multiple diverticula in the ascending colon region, essentially stable. There remains decompression of more distal loops of colon. No free air or portal venous air is evident. Nasogastric tube extends into the  stomach. There remains mesenteric thickening surrounding loops of dilated small bowel box, similar to recent study. There is no evident bowel pneumatosis. Vascular/Lymphatic: There is atherosclerotic calcification in the aorta and common iliac arteries. The celiac and superior mesenteric arteries appear patent proximally. There is a questionable thrombus in the superior mesenteric artery several cm from its origin, best appreciated on axial slices 37 and 38 series 2. No abdominal aortic aneurysm evident. No adenopathy evident. Reproductive: Uterus absent.  No pelvic mass. Other: There is moderate loculated ascites in the abdomen, overall increased. There is extensive anasarca which appears slightly increased overall, particularly in the lateral abdominal wall regions. Appendix region appears normal. No abscess evident. Musculoskeletal: There is degenerative change in the thoracic spine. There is stable anterior wedging of the T12 vertebral body. No blastic or lytic bone lesions are evident. There is no intramuscular lesion. IMPRESSION: Dilated inflamed small bowel loops appear similar to recent study. There is slightly more loculated ascites and slightly more anasarca, particularly mid lateral wall regions compared to most recent study. Transition zone in the mid ileum remains. Question a degree of mechanical small bowel obstruction versus ischemia from apparent focus of superior mesenteric artery thrombus. Note that there is no bowel pneumatosis. No portal venous air. The degree of narrowing in the mid ileal region in the right mid abdomen is stable. Inflammation involving the ascending colon remains, unchanged. No free air. Persistent pleural effusions with bibasilar airspace consolidation. Nasogastric tube tip and side port in stomach. Small hiatal hernia present. No renal or ureteral calculi.  No hydronephrosis. Aortoiliac atherosclerosis. Small calcified splenic artery aneurysm, not felt to be clinically  significant in this age group. Question sludge in gallbladder. There is vicarious excretion of contrast into the gallbladder as well. Electronically Signed   By: Bretta Bang III M.D.   On: 02/27/2016 17:47   Dg Chest Port 1 View  Result Date: 02/28/2016 CLINICAL DATA:  Acute onset of worsening shortness of breath and cough. Initial encounter. EXAM: PORTABLE CHEST 1 VIEW COMPARISON:  Chest radiograph performed 02/23/2016 FINDINGS: The patient's enteric tube is noted extending below the diaphragm. A left subclavian line is noted ending overlying the right atrium. Vascular congestion is noted. Small bilateral pleural effusions are seen. Increased interstitial markings raise concern for pulmonary edema. No pneumothorax is identified. The cardiomediastinal silhouette is mildly enlarged. No acute osseous abnormalities are seen. IMPRESSION: Vascular congestion and mild cardiomegaly. Small bilateral pleural effusions seen. Increased interstitial markings raise concern for pulmonary edema. Electronically Signed   By: Roanna Raider M.D.   On: 02/28/2016 01:11    STUDIES:  None  CULTURES: 02/28/2016 Blood cultures 2. Sputum culture Urine culture  Ciprofloxacin 11/06/2015 > Flagyl 11/06/2015 >  ANTIBIOTICS: 02/28/2016 Vancomycin. Zosyn.  SIGNIFICANT EVENTS: 02/06/2016: ED with hematemesis, hematochezia, A. fib with RVR, hence admitted. 11/06/2015: OR for superior mesenteric artery embolization. 02/28/2016: Acute respiratory failure secondary to pulmonary edema and questionable pneumonia; intubated and transferred to the intensive care unit   LINES/TUBES: PICC line  peripheral IVs  ET tube placed 02/28/2016   DISCUSSION: 80 year old Caucasian female admitted with acute GI bleed complicated by A. fib with RVR and superior mesenteric artery occlusion status post embolization. Now presenting with acute respiratory failure secondary to pulmonary edema  ASSESSMENT /  PLAN:  PULMONARY A: Acute hypoxic respiratory failure secondary to acute pulmonary edema. Bilateral pleural effusions versus infiltrate. Possible pneumonia-aspiration versus bacteria P:   Full vent support with current settings IV diuresis VAP protocol Nebulized bronchodilator ABG post intubation reviewed and vent changes made. Chest x-ray post intubation reviewed. Daily chest x-ray when necessary  CARDIOVASCULAR A: Septic shock. A. fib with RVR Anasarca. History of hypertension P:  Hemodynamic monitoring per ICU protocol IV Lasix. Norepinephrine infusion, titrate to maintain mean arterial blood pressure greater than or equal 65 Hold antihypertensives in light of hypotension and septic shock  RENAL A:   Acute renal failure-creatinine up to 1.4 from 1.03 upon admission Hyponatremia hyperphosphatemia P:   Trend creatinine Renally dose medications Monitor and correct electrolyte imbalances Gentle diuresis  GASTROINTESTINAL A:   Superior mesenteric artery thrombosis, status posts, thrombectomy and embolectomy Acute GI bleed-resolved Unresolving ileus/abdominal distention with questionable small bowel obstruction Ascites P:   Surgery GI and vascular surgery following NG tube to low continuous suction Continue heparin infusion Continue Protonix 40 mg twice a day Continue laxatives We'll consider rectal tube for further decompression if okay with surgery  HEMATOLOGIC A:   SMA thrombosis s/p embolectomy/thrombectomy P:  Heparin infusion per vascular surgery  INFECTIOUS A:   Bilateral infiltrates-high suspicion for pneumonia  Sepsis-GI versus respiratory source, most likely GI; Pro-calcitonin 6.97  P:   Patient is already on ciprofloxacin and Flagyl Start vancomycin and Zosyn. Trend pro-calcitonin Sputum, Blood and urine cultures pending  ENDOCRINE A:   History of type 2 diabetes  P:   Blood glucose monitoring with sliding  scale insulin  coverage Hypoglycemia protocol when necessary   NEUROLOGIC A:   Acute hypoxic/metabolic encephalopathy  P:   RASS goal: -1 to -2 Titrate FiO2 based on ABG Fentanyl and Versed for sedation and comfort   Disposition and family update: Patient transferred to the intensive care unit. Patient sounds, updated at the bedside. They have multiple questions about patient's care, which I referred to the primary team and nursing supervisor, given that we had just assumed the patient's care. All questions pertaining to 2 nights incident. We answered and support provided. Further changes in patient's treatment plan pending clinical course and diagnostics   Total critical care time 90 minutes   Doris Gruhn S. [facility] ANP-BC Pulmonary and Critical Care Medicine [facility] Pager [phone] or [phone] 02/28/2016, 2:11 AM

## 2016-02-28 NOTE — Progress Notes (Signed)
Pt complains of SOB. VSS with 96% O2 on 2L. MD Anne HahnWillis notified. New orders were given.  Will continue to monitor.  Yulanda Diggs, RN

## 2016-02-28 NOTE — Progress Notes (Signed)
Called [facility] to follow-up that pt's CVP level was passed on to MD. [facility]th[facility]ounkai[facility] Dr. Laural BenesSommer's was notified of CVP level of 11.

## 2016-02-28 NOTE — Progress Notes (Signed)
Chaplain on call responded to code blue at 1:12AM. The medical team was doing CPR to the patient while the chaplain attended to the care of the patient's family. There were five members and among them there was the son of the patient who was cursing the medical team and punching the walls. The chaplain calmed down the family member who was acting out of normal and the chaplain promised him to address his concerns to his Mother's caregivers. When the patient was revived she was transferred to the ICU14.

## 2016-02-28 NOTE — Progress Notes (Addendum)
Called Dr. Belia HemanKasa to tell him that pt has had very little output this AM (75 mls in last 4 hours). Per Dr. Belia HemanKasa will give pt 1000mL bolus of NS to treat low output as well as elevated lactic acid from earlier. Orders placed in epic.

## 2016-02-28 NOTE — Progress Notes (Signed)
Patients sat is 99% on 45% FIO2, decreased to 35%, RN notified.

## 2016-02-28 NOTE — Progress Notes (Addendum)
The RN and the NT were going to turn and reposition the patient  and do bladder scan since the  patient was noted to have not urinated for a while. Upon entering the room, the RN  witnessed the patient  having apnea breathing. Code blue was called  but no CPR was performed.  Please check the code sheet for medications administered. Patient's son, Dayton Scrape is in the hospital and is aware of the incident. Patient is transferred to ICU room 14.  Report was given to Brittney.

## 2016-02-28 NOTE — Progress Notes (Signed)
ANTICOAGULATION CONSULT NOTE - Follow Up Consult  Pharmacy Consult for Heparin Indication: SMA occlusion  Allergies  Allergen Reactions  . Demerol [Meperidine] Nausea And Vomiting  . Reglan [Metoclopramide] Swelling  . Penicillins Swelling and Rash    Swelling to arm at injection site. Has patient had a PCN reaction causing immediate rash, facial/tongue/throat swelling, SOB or lightheadedness with hypotension: Yes Has patient had a PCN reaction causing severe rash involving mucus membranes or skin necrosis: No Has patient had a PCN reaction that required hospitalization No Has patient had a PCN reaction occurring within the last 10 years: No If all of the above answers are "NO", then may proceed with Cephalosporin use.     Patient Measurements: Height: 5\' 7"  (170.2 cm) Weight: 246 lb 4.1 oz (111.7 kg) IBW/kg (Calculated) : 61.6 Heparin Dosing Weight: 95.5 kg  Vital Signs: Temp: 97.1 F (36.2 C) (10/07 1228) Temp Source: Axillary (10/07 1228) BP: 72/51 (10/07 1300) Pulse Rate: 80 (10/07 1300)  Labs:  Recent Labs  02/26/16 0414  02/27/16 0255 02/27/16 1111  02/28/16 0020 02/28/16 0226 02/28/16 0236 02/28/16 0628 02/28/16 0835 02/28/16 1350  HGB 9.4*  --  9.2*  --   --   --   --  11.0*  --   --   --   HCT 28.0*  --  27.0*  --   --   --   --  32.5*  --   --   --   PLT 236  --  244  --   --   --   --  324  --   --   --   APTT  --   --   --  >160*  --   --   --   --   --   --   --   LABPROT  --   --   --   --   --   --   --  16.2*  --   --   --   INR  --   --   --   --   --   --   --  1.29  --   --   --   HEPARINUNFRC  --   < > 0.56 0.43  < > 0.55  --   --  0.54  --  0.68  CREATININE 1.03*  --  1.22*  --   --   --   --  1.44*  --   --   --   TROPONINI  --   --   --   --   --   --  0.05*  --   --  <0.03 <0.03  < > = values in this interval not displayed.  Estimated Creatinine Clearance: 41.5 mL/min (by C-G formula based on SCr of 1.44 mg/dL  (H)).   Assessment: Pharmacy consulted to initiate heparin drip for 80 yo female being treated for possible SMA occulusion. Patient admitted with GI bleed. Patient with Afib not on chronic anticoagulation due to GI bleeds.  Goal of Therapy:  Heparin level 0.3-0.7 units/ml Monitor platelets by anticoagulation protocol: Yes   Plan:  Current orders for heparin 2250 units/hr.   Have had difficulty maintaining therapeutic heparin levels in this patient. Question due to antithrombin III levels/heparin resistance and/or sampling error. ATIII level low, however this may be due to duration of heparin therapy at this point.   If levels continue to fluctuate, need to rule out improper  sampling technique, possibly consider ATIII (Thrombate III) administration.   Current heparin level therapeutic x 2. Will continue current rate (despite elevated aPTT; as anti-Xa is much more specific). RN to monitor closely for signs of bleeding.   Recheck heparin level in 6 hours. Will continue frequent checks of heparin level due to difficulty maintaining therapeutic level. Repeat CBC with AM labs.   10/6 1900: HL 0.36 - therapeutic. This is the third HL within range. Will continue to monitor Q6h due to difficulty maintaining therapeutic heparin level.   10/7 00:00 heparin level 0.55. Continue current regimen. Recheck in 6 hours.  10/7 06:30 heparin level 0.54. Continue current regimen. Recheck in 6 hours.   10/7  1350 HL 0.68. Continue current regimen and recheck in 6 hours.    Cher Nakai, PharmD Clinical Pharmacist 02/28/2016 2:48 PM

## 2016-02-28 NOTE — Progress Notes (Signed)
80 yr old female with SMA occlusion POD#6 from SMA thrombectomy.  Patient had a respiratory arrest overnight, now in ICU and intubated but awake and able to shake head for answers.  She denies any abdominal pain this am, denies pain to palpation as well.    Vitals:   02/28/16 1245 02/28/16 1300  BP: (!) 76/53 (!) 72/51  Pulse: 88 80  Resp:    Temp:     I/O last 3 completed shifts: In: 6133.8 [I.V.:5033.8; NG/GT:450; IV Piggyback:650] Out: 2205 [Urine:1805; Emesis/NG output:400] Total I/O In: 1946.6 [I.V.:1096.6; IV Piggyback:850] Out: 75 [Urine:75]   PE: Gen NAD Res: CTAB/L  CardIo: RRR Abd: soft, non-distended, currently non-tender Ext: no edema  CBC Latest Ref Rng & Units 02/28/2016 02/27/2016 02/26/2016  WBC 3.6 - 11.0 K/uL 8.9 6.2 6.3  Hemoglobin 12.0 - 16.0 g/dL 11.0(L) 9.2(L) 9.4(L)  Hematocrit 35.0 - 47.0 % 32.5(L) 27.0(L) 28.0(L)  Platelets 150 - 440 K/uL 324 244 236   CMP Latest Ref Rng & Units 02/28/2016 02/27/2016 02/26/2016  Glucose 65 - 99 mg/dL 098(J185(H) 191(Y194(H) 782(N192(H)  BUN 6 - 20 mg/dL 56(O32(H) 13(Y26(H) 86(V26(H)  Creatinine 0.44 - 1.00 mg/dL 7.84(O1.44(H) 9.62(X1.22(H) 5.28(U1.03(H)  Sodium 135 - 145 mmol/L 129(L) 132(L) 134(L)  Potassium 3.5 - 5.1 mmol/L 4.6 3.9 3.7  Chloride 101 - 111 mmol/L 101 102 105  CO2 22 - 32 mmol/L 20(L) 25 24  Calcium 8.9 - 10.3 mg/dL 7.4(L) 7.1(L) 7.0(L)  Total Protein 6.5 - 8.1 g/dL - - -  Total Bilirubin 0.3 - 1.2 mg/dL - - -  Alkaline Phos 38 - 126 U/L - - -  AST 15 - 41 U/L - - -  ALT 14 - 54 U/L - - -    A/P:  80 yr old female with SMA occlusion POD# 6 from SMA thrombectomy.  Patient with respiratory arrest overnight.  CT scan reviewed, bowel appears similar to previous ct scan, no evidence of perforation or pneumotosis, some thickening of ascending colon and dilated loops of small bowel, slightly increased ascites.  Clinically no abdominal tenderness, likely just with ileus after having SMA thrombus.  Continue NG tube, awaiting bowel function which will  likely start returning about 72 hours after her other medical issues resolve.  Surgery will continue to follow but no need for any intervention at this time.

## 2016-02-28 NOTE — Progress Notes (Signed)
PT Cancellation Note  Patient Details Name: [name] MRN: [id number] DOB: 12/02/1935   Cancelled Treatment:    Reason Eval/Treat Not Completed: Patient not medically ready Pt on vent, attempted to wake pt up she was non-reactive.  Pt not appropriate for PT at this time - will try back when pt is more appropriate. Spoke with nursing.   Malachi ProGalen R [name] 02/28/2016, 1:31 PM

## 2016-02-28 NOTE — Progress Notes (Signed)
ANTICOAGULATION CONSULT NOTE - Follow Up Consult  Pharmacy Consult for Heparin Indication: SMA occlusion  Allergies  Allergen Reactions  . Demerol [Meperidine] Nausea And Vomiting  . Reglan [Metoclopramide] Swelling  . Penicillins Swelling and Rash    Swelling to arm at injection site. Has patient had a PCN reaction causing immediate rash, facial/tongue/throat swelling, SOB or lightheadedness with hypotension: Yes Has patient had a PCN reaction causing severe rash involving mucus membranes or skin necrosis: No Has patient had a PCN reaction that required hospitalization No Has patient had a PCN reaction occurring within the last 10 years: No If all of the above answers are "NO", then may proceed with Cephalosporin use.     Patient Measurements: Height: 5\' 7"  (170.2 cm) Weight: 266 lb 6.8 oz (120.8 kg) IBW/kg (Calculated) : 61.6 Heparin Dosing Weight: 95.5 kg  Vital Signs: Temp: 98.5 F (36.9 C) (10/07 2000) Temp Source: Axillary (10/07 2000) BP: 82/42 (10/07 2000) Pulse Rate: 45 (10/07 2000)  Labs:  Recent Labs  02/26/16 0414  02/27/16 0255 02/27/16 1111  02/28/16 0226 02/28/16 0236 02/28/16 0628 02/28/16 0835 02/28/16 1350 02/28/16 2005  HGB 9.4*  --  9.2*  --   --   --  11.0*  --   --   --   --   HCT 28.0*  --  27.0*  --   --   --  32.5*  --   --   --   --   PLT 236  --  244  --   --   --  324  --   --   --   --   APTT  --   --   --  >160*  --   --   --   --   --   --   --   LABPROT  --   --   --   --   --   --  16.2*  --   --   --   --   INR  --   --   --   --   --   --  1.29  --   --   --   --   HEPARINUNFRC  --   < > 0.56 0.43  < >  --   --  0.54  --  0.68 0.58  CREATININE 1.03*  --  1.22*  --   --   --  1.44*  --   --   --   --   TROPONINI  --   --   --   --   --  0.05*  --   --  <0.03 <0.03  --   < > = values in this interval not displayed.  Estimated Creatinine Clearance: 43.4 mL/min (by C-G formula based on SCr of 1.44 mg/dL  (H)).   Assessment: Pharmacy consulted to initiate heparin drip for 80 yo female being treated for possible SMA occulusion. Patient admitted with GI bleed. Patient with Afib not on chronic anticoagulation due to GI bleeds.  Goal of Therapy:  Heparin level 0.3-0.7 units/ml Monitor platelets by anticoagulation protocol: Yes   Plan:  Current orders for heparin 2250 units/hr.   Have had difficulty maintaining therapeutic heparin levels in this patient. Question due to antithrombin III levels/heparin resistance and/or sampling error. ATIII level low, however this may be due to duration of heparin therapy at this point.   If levels continue to fluctuate, need to rule out improper  sampling technique, possibly consider ATIII (Thrombate III) administration.   Current heparin level therapeutic x 2. Will continue current rate (despite elevated aPTT; as anti-Xa is much more specific). RN to monitor closely for signs of bleeding.   Recheck heparin level in 6 hours. Will continue frequent checks of heparin level due to difficulty maintaining therapeutic level. Repeat CBC with AM labs.   10/6 1900: HL 0.36 - therapeutic. This is the third HL within range. Will continue to monitor Q6h due to difficulty maintaining therapeutic heparin level.   10/7 00:00 heparin level 0.55. Continue current regimen. Recheck in 6 hours.  10/7 06:30 heparin level 0.54. Continue current regimen. Recheck in 6 hours.   10/7  1350 HL 0.68. Continue current regimen and recheck in 6 hours.   10/7 2000 HL 0.58. Will continue current regimen and recheck with am labs.    Cher Nakai, PharmD Clinical Pharmacist 02/28/2016 9:34 PM

## 2016-02-28 NOTE — Progress Notes (Signed)
[facility]  Daily Progress Note   Subjective  - 6 Days Post-Op  Developed respiratory failure and was intubated overnight. On the ventilator this morning. Unable to provide history.  Objective Vitals:   02/28/16 1145 02/28/16 1200 02/28/16 1215 02/28/16 1228  BP: (!) 100/58 (!) 81/51 (!) 94/58   Pulse: (!) 120 (!) 106 (!) 108   Resp:      Temp:    97.1 F (36.2 C)  TempSrc:    Axillary  SpO2: 97% 97% 98%   Weight:      Height:        Intake/Output Summary (Last 24 hours) at 02/28/16 1237 Last data filed at 02/28/16 0700  Gross per 24 hour  Intake          4414.83 ml  Output             1105 ml  Net          3309.83 ml    PULM  Coarse BS, on the vent CV  tachycardic VASC  Access site C/D/I  Laboratory CBC    Component Value Date/Time   WBC 8.9 02/28/2016 0236   HGB 11.0 (L) 02/28/2016 0236   HGB 11.4 (L) 07/10/2013 0418   HCT 32.5 (L) 02/28/2016 0236   HCT 34.1 (L) 07/10/2013 0418   PLT 324 02/28/2016 0236   PLT 301 07/10/2013 0418    BMET    Component Value Date/Time   NA 129 (L) 02/28/2016 0236   NA 142 07/10/2013 0418   K 4.6 02/28/2016 0236   K 4.5 07/10/2013 0418   CL 101 02/28/2016 0236   CL 113 (H) 07/10/2013 0418   CO2 20 (L) 02/28/2016 0236   CO2 23 07/10/2013 0418   GLUCOSE 185 (H) 02/28/2016 0236   GLUCOSE 149 (H) 07/10/2013 0418   BUN 32 (H) 02/28/2016 0236   BUN 12 07/10/2013 0418   CREATININE 1.44 (H) 02/28/2016 0236   CREATININE 1.18 07/10/2013 0418   CALCIUM 7.4 (L) 02/28/2016 0236   CALCIUM 8.6 07/10/2013 0418   GFRNONAA 34 (L) 02/28/2016 0236   GFRNONAA 45 (L) 07/10/2013 0418   GFRAA 39 (L) 02/28/2016 0236   GFRAA 52 (L) 07/10/2013 0418    Assessment/Planning: POD #6 s/p SMA intervention   Respiratory failure overnight. Now being treated for pneumonia. On the ventilator and unable to provide history.  Heparin should continue unless bleeding becomes intolerable.  No intervention or angiographic  evaluation likely possible for his SMA stent at this point due to his respiratory issues. Would be unlikely to have acute thrombosis    Festus BarrenJason Dew  02/28/2016, 12:37 PM

## 2016-02-28 NOTE — ED Provider Notes (Signed)
[facility]  Department of Emergency Medicine   Code Blue CONSULT NOTE  Chief Complaint: Cardiac arrest/unresponsive   Level V Caveat: Unresponsive  History of present illness: I was contacted by the hospital for a CODE BLUE cardiac arrest upstairs and presented to the patient's bedside.   Arrived to find patient with agonal breathing; does have spontaneous pulse.  ROS: Unable to obtain, Level V caveat  Scheduled Meds: . bisacodyl  10 mg Rectal Daily  . ciprofloxacin  400 mg Intravenous BID  . digoxin  0.125 mg Intravenous Q48H  . digoxin  0.5 mg Intravenous Once  . insulin aspart  0-20 Units Subcutaneous Q4H  . insulin glargine  30 Units Subcutaneous Daily  . mouth rinse  15 mL Mouth Rinse BID  . metoprolol  5 mg Intravenous Q6H  . metronidazole  500 mg Intravenous Q8H  . pantoprazole (PROTONIX) IV  40 mg Intravenous Q12H  . sodium chloride flush  3 mL Intravenous Q12H   Continuous Infusions: . sodium chloride 50 mL/hr at 02/27/16 1030  . amiodarone 30 mg/hr (02/27/16 2301)  . heparin 2,250 Units/hr (02/27/16 2302)  . Marland KitchenTPN (CLINIMIX-E) Adult 85 mL/hr at 02/27/16 1844   PRN Meds:.acetaminophen, acetaminophen, diltiazem, iopamidol, ketorolac, ondansetron **OR** ondansetron (ZOFRAN) IV, phenol-menthol Past Medical History:  Diagnosis Date  . Atrial fibrillation (HCC)   . Diabetes mellitus without complication (HCC)   . Hypertension    Past Surgical History:  Procedure Laterality Date  . ABDOMINAL HYSTERECTOMY    . PERIPHERAL VASCULAR CATHETERIZATION N/A 03/17/2016   Procedure: Abdominal Aortogram;  Surgeon: Renford Dills, MD;  Location: [facility] INVASIVE CV LAB;  Service: Cardiovascular;  Laterality: N/A;   Social History   Social History  . Marital status: Divorced    Spouse name: N/A  . Number of children: N/A  . Years of education: N/A   Occupational History  . Not on file.   Social History Main Topics  . Smoking status: Never Smoker  .  Smokeless tobacco: Never Used  . Alcohol use No  . Drug use: No  . Sexual activity: Not on file   Other Topics Concern  . Not on file   Social History Narrative  . No narrative on file   Allergies  Allergen Reactions  . Demerol [Meperidine] Nausea And Vomiting  . Reglan [Metoclopramide] Swelling  . Penicillins Swelling and Rash    Swelling to arm at injection site. Has patient had a PCN reaction causing immediate rash, facial/tongue/throat swelling, SOB or lightheadedness with hypotension: Yes Has patient had a PCN reaction causing severe rash involving mucus membranes or skin necrosis: No Has patient had a PCN reaction that required hospitalization No Has patient had a PCN reaction occurring within the last 10 years: No If all of the above answers are "NO", then may proceed with Cephalosporin use.     Last set of Vital Signs (not current) Vitals:   02/28/16 0126 02/28/16 0129  BP: (!) 54/36 (!) 83/52  Pulse: 64 81  Resp:    Temp:        Physical Exam  Gen: unresponsive Cardiovascular: Pulse Resp: Agonal breathing. Breath sounds equal bilaterally with bagging  Abd: distended; NG tube with dark gastric contents Neuro: GCS 3, unresponsive to pain  HEENT: No blood in posterior pharynx, gag reflex absent  Neck: No crepitus  Musculoskeletal: No deformity  Skin: warm  Procedures  INTUBATION Performed by: RT Joe. I was physically at bedside to supervise and to assist as  needed. Required items: required blood products, implants, devices, and special equipment available Patient identity confirmed: provided demographic data and hospital-assigned identification number Time out: Immediately prior to procedure a "time out" was called to verify the correct patient, procedure, equipment, support staff and site/side marked as required. Indications: Agonal breathing/airway protection  Intubation method: Glidescope Preoxygenation: BVM Sedatives: Etomidate Paralytic:  Succinycholine Tube Size: 8-0 cuffed Post-procedure assessment: chest rise and ETCO2 monitor Breath sounds: equal and absent over the epigastrium Tube secured by Respiratory Therapy Patient tolerated the procedure well with no immediate complications.  CRITICAL CARE Performed by: Irean HongSUNG,JADE J Total critical care time: 30 Critical care time was exclusive of separately billable procedures and treating other patients. Critical care was necessary to treat or prevent imminent or life-threatening deterioration. Critical care was time spent personally by me on the following activities: development of treatment plan with patient and/or surrogate as well as nursing, discussions with consultants, evaluation of patient's response to treatment, examination of patient, obtaining history from patient or surrogate, ordering and performing treatments and interventions, ordering and review of laboratory studies, ordering and review of radiographic studies, pulse oximetry and re-evaluation of patient's condition.  Critical care nurse practitioner [place] at bedside running the code.   Irean HongJade J Sung, MD 02/28/16 [phone]

## 2016-02-28 NOTE — Progress Notes (Signed)
PARENTERAL NUTRITION CONSULT NOTE - FOLLOW UP  Pharmacy Consult for TPN monitoring Indication: NPO  Allergies  Allergen Reactions  . Demerol [Meperidine] Nausea And Vomiting  . Reglan [Metoclopramide] Swelling  . Penicillins Swelling and Rash    Swelling to arm at injection site. Has patient had a PCN reaction causing immediate rash, facial/tongue/throat swelling, SOB or lightheadedness with hypotension: Yes Has patient had a PCN reaction causing severe rash involving mucus membranes or skin necrosis: No Has patient had a PCN reaction that required hospitalization No Has patient had a PCN reaction occurring within the last 10 years: No If all of the above answers are "NO", then may proceed with Cephalosporin use.     Patient Measurements: Height: _0  (170.2 cm) Weight: 246 lb 4.1 oz (111.7 kg) IBW/kg (Calculated) : 61.6 Adjusted Body Weight: 92.36 kg  Vital Signs: Temp: 97.1 F (36.2 C) (10/07 1228) Temp Source: Axillary (10/07 1228) BP: 72/51 (10/07 1300) Pulse Rate: 80 (10/07 1300) Intake/Output from previous day: 10/06 0701 - 10/07 0700 In: 4714.8 [I.V.:3714.8; NG/GT:450; IV Piggyback:550] Out: 2707 [Urine:1155; Emesis/NG output:150] Intake/Output from this shift: Total I/O In: 1946.6 [I.V.:1096.6; IV Piggyback:850] Out: 75 [Urine:75]  Labs:  Recent Labs  02/26/16 0414 02/27/16 0255 02/27/16 1111 02/28/16 0236  WBC 6.3 6.2  --  8.9  HGB 9.4* 9.2*  --  11.0*  HCT 28.0* 27.0*  --  32.5*  PLT 236 244  --  324  APTT  --   --  >160*  --   INR  --   --   --  1.29     Recent Labs  02/26/16 0414 02/27/16 0255 02/28/16 0226 02/28/16 0236  NA 134* 132*  --  129*  K 3.7 3.9  --  4.6  CL 105 102  --  101  CO2 24 25  --  20*  GLUCOSE 192* 194*  --  185*  BUN 26* 26*  --  32*  CREATININE 1.03* 1.22*  --  1.44*  CALCIUM 7.0* 7.1*  --  7.4*  MG 1.9  --   --  2.2  PHOS  --   --   --  6.2*  ALBUMIN 1.2*  --   --   --   TRIG  --   --  38  --    Estimated  Creatinine Clearance: 41.5 mL/min (by C-G formula based on SCr of 1.44 mg/dL (H)).    Recent Labs  02/28/16 0442 02/28/16 0720 02/28/16 1119  GLUCAP 207* 182* 222*    Medications:  Scheduled:  . bisacodyl  10 mg Rectal Daily  . chlorhexidine gluconate (MEDLINE KIT)  15 mL Mouth Rinse BID  . ciprofloxacin  400 mg Intravenous BID  . digoxin  0.125 mg Intravenous Q48H  . digoxin  0.5 mg Intravenous Once  . famotidine (PEPCID) IV  20 mg Intravenous Q12H  . insulin aspart  0-20 Units Subcutaneous Q4H  . insulin glargine  30 Units Subcutaneous Daily  . mouth rinse  15 mL Mouth Rinse 10 times per day  . metoprolol  5 mg Intravenous Q6H  . metronidazole  500 mg Intravenous Q8H  . pantoprazole (PROTONIX) IV  40 mg Intravenous Q12H  . piperacillin-tazobactam (ZOSYN)  IV  4.5 g Intravenous Q8H  . polyethylene glycol  17 g Per Tube Daily  . sodium chloride flush  10-40 mL Intracatheter Q12H  . sodium chloride flush  3 mL Intravenous Q12H  . vancomycin  1,000 mg Intravenous Q18H  Infusions:  . sodium chloride Stopped (02/28/16 0115)  . amiodarone 30 mg/hr (02/28/16 1221)  . fentaNYL infusion INTRAVENOUS 100 mcg/hr (02/28/16 0956)  . heparin 2,250 Units/hr (02/28/16 0700)  . norepinephrine (LEVOPHED) Adult infusion 27 mcg/min (02/28/16 1305)  . Marland KitchenTPN (CLINIMIX-E) Adult 85 mL/hr at 02/28/16 0700    Insulin Requirements in the past 24 hours:  Insulin glargine 30 units yesterday AM, SSI 27 units, 38 units of insulin added to TPN  Current Nutrition:  Clinimix 5/15 E at 82m/hr and Lipids 20% 3 times a week  Nutritional Goals:  1912 kCal, 84 grams of protein per day K > 4.0 Mg > 2.0 BG < 180  Plan:  Glucose control continues to improve. Will continue with insulin 38units in TPN. Continue insulin glargine 30 units QAM and SSI (resistant scale).  Creatinine up, sodium trending down. Will continue to monitor.   Phosphorus up some today. No additional supplementation today. Recheck  electrolytes with AM labs.  LPaulina Fusi PharmD, BCPS 02/28/2016 2:06 PM

## 2016-02-28 NOTE — Progress Notes (Signed)
eLink Physician-Brief Progress Note Patient Name: Dyanne CarrelBobbie L Joslyn DOB: 11/26/1935 MRN: [id number]   Date of Service  02/28/2016  HPI/Events of Note  Oliguria.  eICU Interventions  1. Monitor CVP. If CVP < 10, give more fluid. If CVP >=  10, consider Lasix.      Intervention Category Intermediate Interventions: Oliguria - evaluation and management  Tryton Bodi Eugene 02/28/2016, 5:05 PM

## 2016-02-29 ENCOUNTER — Inpatient Hospital Stay: Payer: Commercial Managed Care - HMO

## 2016-02-29 DIAGNOSIS — E0801 Diabetes mellitus due to underlying condition with hyperosmolarity with coma: Secondary | ICD-10-CM

## 2016-02-29 LAB — URINE CULTURE

## 2016-02-29 LAB — BLOOD GAS, ARTERIAL
ACID-BASE DEFICIT: 13.7 mmol/L — AB (ref 0.0–2.0)
BICARBONATE: 13.8 mmol/L — AB (ref 20.0–28.0)
FIO2: 0.35
O2 Saturation: 81.3 %
PEEP/CPAP: 5 cmH2O
PH ART: 7.18 — AB (ref 7.350–7.450)
Patient temperature: 37
RATE: 18 resp/min
VT: 500 mL
pCO2 arterial: 37 mmHg (ref 32.0–48.0)
pO2, Arterial: 58 mmHg — ABNORMAL LOW (ref 83.0–108.0)

## 2016-02-29 LAB — CBC
HEMATOCRIT: 31.7 % — AB (ref 35.0–47.0)
HEMOGLOBIN: 10.1 g/dL — AB (ref 12.0–16.0)
MCH: 29.5 pg (ref 26.0–34.0)
MCHC: 32 g/dL (ref 32.0–36.0)
MCV: 92.4 fL (ref 80.0–100.0)
Platelets: 269 10*3/uL (ref 150–440)
RBC: 3.43 MIL/uL — ABNORMAL LOW (ref 3.80–5.20)
RDW: 15.1 % — ABNORMAL HIGH (ref 11.5–14.5)
WBC: 15.3 10*3/uL — ABNORMAL HIGH (ref 3.6–11.0)

## 2016-02-29 LAB — GLUCOSE, CAPILLARY
GLUCOSE-CAPILLARY: 193 mg/dL — AB (ref 65–99)
GLUCOSE-CAPILLARY: 203 mg/dL — AB (ref 65–99)
Glucose-Capillary: 204 mg/dL — ABNORMAL HIGH (ref 65–99)

## 2016-02-29 LAB — BASIC METABOLIC PANEL
Anion gap: 10 (ref 5–15)
BUN: 43 mg/dL — AB (ref 6–20)
CHLORIDE: 97 mmol/L — AB (ref 101–111)
CO2: 17 mmol/L — AB (ref 22–32)
CREATININE: 2 mg/dL — AB (ref 0.44–1.00)
Calcium: 6.3 mg/dL — CL (ref 8.9–10.3)
GFR calc Af Amer: 26 mL/min — ABNORMAL LOW (ref >60)
GFR calc non Af Amer: 23 mL/min — ABNORMAL LOW (ref >60)
Glucose, Bld: 229 mg/dL — ABNORMAL HIGH (ref 65–99)
Potassium: 5.5 mmol/L — ABNORMAL HIGH (ref 3.5–5.1)
Sodium: 124 mmol/L — ABNORMAL LOW (ref 135–145)

## 2016-02-29 LAB — LACTIC ACID, PLASMA: Lactic Acid, Venous: 5.4 mmol/L (ref 0.5–1.9)

## 2016-02-29 LAB — PROCALCITONIN: Procalcitonin: 23.38 ng/mL

## 2016-02-29 LAB — PHOSPHORUS: Phosphorus: 7.9 mg/dL — ABNORMAL HIGH (ref 2.5–4.6)

## 2016-02-29 LAB — HEPARIN LEVEL (UNFRACTIONATED): Heparin Unfractionated: 0.54 IU/mL (ref 0.30–0.70)

## 2016-02-29 LAB — MAGNESIUM: MAGNESIUM: 1.8 mg/dL (ref 1.7–2.4)

## 2016-02-29 MED ORDER — STERILE WATER FOR INJECTION IV SOLN
INTRAVENOUS | Status: DC
  Filled 2016-02-29: qty 850

## 2016-02-29 MED ORDER — SODIUM CHLORIDE 0.9 % IV BOLUS (SEPSIS)
500.0000 mL | INTRAVENOUS | Status: AC
  Administered 2016-02-29: 500 mL via INTRAVENOUS

## 2016-02-29 MED ORDER — INSULIN REGULAR HUMAN 100 UNIT/ML IJ SOLN
INTRAVENOUS | Status: DC
  Administered 2016-02-29: 08:00:00 via INTRAVENOUS
  Filled 2016-02-29: qty 850

## 2016-02-29 MED ORDER — CLINIMIX/DEXTROSE (5/15) 5 % IV SOLN
INTRAVENOUS | Status: DC
  Filled 2016-02-29: qty 850

## 2016-02-29 MED ORDER — AMIODARONE LOAD VIA INFUSION
150.0000 mg | Freq: Once | INTRAVENOUS | Status: AC
  Administered 2016-02-29: 150 mg via INTRAVENOUS
  Filled 2016-02-29: qty 83.34

## 2016-03-01 LAB — CULTURE, RESPIRATORY

## 2016-03-01 LAB — CULTURE, RESPIRATORY W GRAM STAIN

## 2016-03-04 LAB — CULTURE, BLOOD (ROUTINE X 2)
CULTURE: NO GROWTH
Culture: NO GROWTH

## 2016-03-16 MED ORDER — MIDAZOLAM HCL 2 MG/2ML IJ SOLN
INTRAMUSCULAR | Status: AC
  Filled 2016-03-16: qty 4

## 2016-03-16 MED ORDER — FENTANYL CITRATE (PF) 100 MCG/2ML IJ SOLN
INTRAMUSCULAR | Status: DC
Start: 2016-03-16 — End: 2016-03-16
  Filled 2016-03-16: qty 4

## 2016-03-16 MED ORDER — ROCURONIUM BROMIDE 50 MG/5ML IV SOLN
INTRAVENOUS | Status: AC
  Filled 2016-03-16: qty 1

## 2016-03-24 NOTE — Progress Notes (Signed)
80 yr old female with SMA occlusion POD#7 from SMA thrombectomy.  Patient in ICU after respiratory arrest, now in Afib with RVR on amioderone, digoxin,  Renal failure with oliguria and max levophed with hypotension.  Patient not responsive like yesterday, she will move to painful stimuli but not purposeful.    Vitals:   03/13/2016 0600 03/22/2016 0800  BP: (!) 111/99 93/63  Pulse: (!) 132 63  Resp: 18 19  Temp:  98.8 F (37.1 C)   I/O last 3 completed shifts: In: 8058.1 [I.V.:6308.1; IV Piggyback:1750] Out: 1405 [Urine:1255; Emesis/NG output:150] Total I/O In: -  Out: 26 [Urine:26]   PE: Gen NAD Res: crackles, on vent  CardIo: irregularly, irregular tachycarida Abd: soft, distended but slightly improved from past couple days, appears non tender   CBC Latest Ref Rng & Units 02/25/2016 02/28/2016 02/27/2016  WBC 3.6 - 11.0 K/uL 15.3(H) 8.9 6.2  Hemoglobin 12.0 - 16.0 g/dL 10.1(L) 11.0(L) 9.2(L)  Hematocrit 35.0 - 47.0 % 31.7(L) 32.5(L) 27.0(L)  Platelets 150 - 440 K/uL 269 324 244   CMP Latest Ref Rng & Units 03/22/2016 02/28/2016 02/27/2016  Glucose 65 - 99 mg/dL 161(W229(H) 960(A185(H) 540(J194(H)  BUN 6 - 20 mg/dL 81(X43(H) 91(Y32(H) 78(G26(H)  Creatinine 0.44 - 1.00 mg/dL 9.56(O2.00(H) 1.30(Q1.44(H) 6.57(Q1.22(H)  Sodium 135 - 145 mmol/L 124(L) 129(L) 132(L)  Potassium 3.5 - 5.1 mmol/L 5.5(H) 4.6 3.9  Chloride 101 - 111 mmol/L 97(L) 101 102  CO2 22 - 32 mmol/L 17(L) 20(L) 25  Calcium 8.9 - 10.3 mg/dL 6.3(LL) 7.4(L) 7.1(L)  Total Protein 6.5 - 8.1 g/dL - - -  Total Bilirubin 0.3 - 1.2 mg/dL - - -  Alkaline Phos 38 - 126 U/L - - -  AST 15 - 41 U/L - - -  ALT 14 - 54 U/L - - -    A/P:  80 yr old female with SMA occlusion POD# 7 from SMA thrombectomy.  Patient now in multisystem organ failure overnight with increase in WBC, LA and Cr.  She is now unresponsive and acidotic.  I believe with the culmination of medical issues the patient had respiratory arrest, fluid overload, ileus and now had gone into Afib with RVR,  hypotension, renal failure and likely now has ischemic bowel as well with the pressor requirements etc.  She is unlikely to survive her current state.  Discussed with Dr. Belia HemanKasa of critical care and family is making decisions on her care.

## 2016-03-24 NOTE — Progress Notes (Signed)
ANTICOAGULATION CONSULT NOTE - Follow Up Consult  Pharmacy Consult for Heparin Indication: SMA occlusion  Allergies  Allergen Reactions  . Demerol [Meperidine] Nausea And Vomiting  . Reglan [Metoclopramide] Swelling  . Penicillins Swelling and Rash    Swelling to arm at injection site. Has patient had a PCN reaction causing immediate rash, facial/tongue/throat swelling, SOB or lightheadedness with hypotension: Yes Has patient had a PCN reaction causing severe rash involving mucus membranes or skin necrosis: No Has patient had a PCN reaction that required hospitalization No Has patient had a PCN reaction occurring within the last 10 years: No If all of the above answers are "NO", then may proceed with Cephalosporin use.     Patient Measurements: Height: 5\' 7"  (170.2 cm) Weight: 261 lb 7.5 oz (118.6 kg) IBW/kg (Calculated) : 61.6 Heparin Dosing Weight: 95.5 kg  Vital Signs: Temp: 97.6 F (36.4 C) (10/08 0000) Temp Source: Axillary (10/08 0000) BP: 81/66 (10/08 0500) Pulse Rate: 131 (10/08 0500)  Labs:  Recent Labs  02/27/16 0255 02/27/16 1111  02/28/16 0226 02/28/16 0236  02/28/16 0835 02/28/16 1350 02/28/16 2005 03/10/2016 0444  HGB 9.2*  --   --   --  11.0*  --   --   --   --  10.1*  HCT 27.0*  --   --   --  32.5*  --   --   --   --  31.7*  PLT 244  --   --   --  324  --   --   --   --  269  APTT  --  >160*  --   --   --   --   --   --   --   --   LABPROT  --   --   --   --  16.2*  --   --   --   --   --   INR  --   --   --   --  1.29  --   --   --   --   --   HEPARINUNFRC 0.56 0.43  < >  --   --   < >  --  0.68 0.58 0.54  CREATININE 1.22*  --   --   --  1.44*  --   --   --   --  2.00*  TROPONINI  --   --   --  0.05*  --   --  <0.03 <0.03  --   --   < > = values in this interval not displayed.  Estimated Creatinine Clearance: 30.9 mL/min (by C-G formula based on SCr of 2 mg/dL (H)).   Assessment: Pharmacy consulted to initiate heparin drip for 80 yo female  being treated for possible SMA occulusion. Patient admitted with GI bleed. Patient with Afib not on chronic anticoagulation due to GI bleeds.  Goal of Therapy:  Heparin level 0.3-0.7 units/ml Monitor platelets by anticoagulation protocol: Yes   Plan:  Current orders for heparin 2250 units/hr.   Have had difficulty maintaining therapeutic heparin levels in this patient. Question due to antithrombin III levels/heparin resistance and/or sampling error. ATIII level low, however this may be due to duration of heparin therapy at this point.   If levels continue to fluctuate, need to rule out improper sampling technique, possibly consider ATIII (Thrombate III) administration.   Current heparin level therapeutic x 2. Will continue current rate (despite elevated aPTT; as anti-Xa is much more specific).  RN to monitor closely for signs of bleeding.   Recheck heparin level in 6 hours. Will continue frequent checks of heparin level due to difficulty maintaining therapeutic level. Repeat CBC with AM labs.   10/6 1900: HL 0.36 - therapeutic. This is the third HL within range. Will continue to monitor Q6h due to difficulty maintaining therapeutic heparin level.   10/7 00:00 heparin level 0.55. Continue current regimen. Recheck in 6 hours.  10/7 06:30 heparin level 0.54. Continue current regimen. Recheck in 6 hours.   10/7  1350 HL 0.68. Continue current regimen and recheck in 6 hours.   10/7 2000 HL 0.58. Will continue current regimen and recheck with am labs.   10/8 AM heparin level 0.54. Continue current regimen. Heparin level has been therapeutic and stable for ~24 hours. Will order CBC and heparin level for tomorrow AM labs.   Cher NakaiSheema Hallaji, PharmD Clinical Pharmacist 03/11/2016 6:34 AM

## 2016-03-24 NOTE — Progress Notes (Signed)
Nutrition Follow-up  DOCUMENTATION CODES:   Morbid obesity  INTERVENTION:     NUTRITION DIAGNOSIS:   Inadequate oral intake related to altered GI function as evidenced by per patient/family report.    GOAL:   Patient will meet greater than or equal to 90% of their needs    MONITOR:   Labs, Weight trends, I & O's, PO intake, Diet advancement  REASON FOR ASSESSMENT:   Consult New TPN/TNA  ASSESSMENT:   [name]  is a 80 y.o. female who presents with Acute onset abdominal pain with coffee-ground emesis and hematochezia starting today just after lunch time. Here in the ED her blood pressure stable, hemoglobin is stable, she has persistent abdominal pain and nausea.  Patient is currently intubated on ventilator support MV: 9 L/min Temp (24hrs), Avg:97.9 F (36.6 C), Min:97.1 F (36.2 C), Max:98.8 F (37.1 C)  Propofol: none   Diet Order:  Diet NPO time specified TPN (CLINIMIX) Adult without lytes  Skin:  Reviewed, no issues  Last BM:  9/30  Height:   Ht Readings from Last 1 Encounters:  02/16/16 5\' 7"  (1.702 m)    Weight:   Wt Readings from Last 1 Encounters:  03/07/2016 261 lb 7.5 oz (118.6 kg)    Ideal Body Weight:  61.36 kg  BMI:  Body mass index is 40.95 kg/m.  Estimated Nutritional Needs:   Kcal:  1304-1660 calories  Protein:  153  Fluid:  >/= 1.3:  EDUCATION NEEDS:   No education needs identified at this time

## 2016-03-24 NOTE — Progress Notes (Signed)
RT called to room to remove ETT from deceased patient.  ETT removed and vent removed from room

## 2016-03-24 NOTE — Progress Notes (Signed)
[facility] returned call and ordered amiodarone bolus and to draw labs now to esp. check K and Mg levels.

## 2016-03-24 NOTE — Progress Notes (Signed)
[facility] titration of Levo with persistent hypotension and tachycardia. Held metoprolol at [facility] due to hypotension. Want to know what to do for tachy HR. Pola CornELINK RN to inform [facility].

## 2016-03-24 NOTE — Progress Notes (Signed)
Notified MD, Dr. Belia HemanKasa, about critical lactic acid value of 5.4. MD ordered ABG to assess patient for acidosis. Patient currently on levophed, per RN, Dr. Belia HemanKasa, and Dr. Orvis BrillLoflin (surgeon) discussion, other vasopressors could negatively impact blood flow to intestines so per Dr. Belia HemanKasa will keep just the levophed for now. CVP this morning was 16 so no more fluid boluses ordered at this time. Team will continue to monitor.

## 2016-03-24 NOTE — Progress Notes (Signed)
PT Cancellation Note  Patient Details Name: [name] MRN: [id number] DOB: 01/03/1936   Cancelled Treatment:    Reason Eval/Treat Not Completed: Patient not medically ready. Pt with recent move to CCU on 10/7 secondary to Code Blue and intubation. Pt still currently on vent. Pt currently on  Vasopressors secondary to decreased BP (93/63) but is experiencing increased HR (132 most recent) as well as elevated K+. Pt is not appropriate for therapy evaluation at this time. Secondary to moving to CCU, pt has change in status and will need new orders to resume care. Will complete current order at this time. Please re-order when medically appropriate.   Vuk Skillern 03/03/2016, 8:26 AM  Elizabeth PalauStephanie Arian Mcquitty, PT, DPT [phone]

## 2016-03-24 NOTE — Consult Note (Signed)
PULMONARY / CRITICAL CARE MEDICINE   Name: [name] MRN: [id number] DOB: 11/23/1935    ADMISSION DATE:  01/23/2016   CONSULTATION DATE: 02/28/2016  REFERRING MD: Dr. Oralia ManisWillis David  CHIEF COMPLAINT: Agonal breathing and hypoxia  HISTORY OF PRESENT ILLNESS:   This is a 80 y/o Caucasian female with a PMH of hypertension, DM, and Afib who initially presented on  02/16/2016 with coffee ground emesis, diarrhea, hematochezia and abdominal pain. In the ED, she was found to be in afib with rvr.  She was admitted with acute GI bleed and afib with RVR by the Hospitalist service and started on diltiazem, protonix, iv fluids and GI was consulted. A CT abdomen showed a small bowel obstruction and surgery was consulted. An NGT was placed to suction and a repeat CT abdomen showed a superior mesenteric artery occlusion. Patient was seen by vascular and underwent a thrombectomy/embolization of the superior mesenteric artery on 03/08/2016. At about 4 PM on 02/27/2016, patient started complaining of worsening abdominal pain. NG tube was placed to low intermittent suction. At about 11:30 PM, patient started complaining of shortness of breath. She was placed on 2 L nasal cannula of oxygen and a chest x-ray was obtained. At about 2 AM. Patient was found with agonal breathing with a respiratory rate of about 6 breaths per minute. A CODE BLUE was called, but no CPR was performed as patient had a pulse. BMV ventilation was initiated and patient was emergently intubated and transferred to the intensive care unit. Per patient's family, patient has been complaining of shortness of breath during the course of the day. Her abdomen remains very distended and her NG output is still profound at approximately 350 mL; output is mainly bilious in nature. She was on TPN, amiodarone for A. fib with RVR and heparin for anticoagulation post SMA embolization   SUBJECTIVE and EVENTS 10/6 intubated 10/7 multiorgan failure, family  updated 10/8 remains critically ill  Remains critically ill, multiorgan failure On vasopressors, resp failure, worsening renal failure  REVIEW OF SYSTEMS:   Above to obtain as patient is currently obtunded, resp failure  SUBJECTIVE:   VITAL SIGNS: BP (!) 111/99   Pulse (!) 132   Temp 97.6 F (36.4 C) (Axillary)   Resp 18   Ht 5\' 7"  (1.702 m)   Wt 261 lb 7.5 oz (118.6 kg)   SpO2 91%   BMI 40.95 kg/m   HEMODYNAMICS: CVP:  [10 mmHg-30 mmHg] 12 mmHg  VENTILATOR SETTINGS: Vent Mode: PRVC FiO2 (%):  [35 %] 35 % Set Rate:  [18 bmp] 18 bmp Vt Set:  [500 mL] 500 mL PEEP:  [5 cmH20] 5 cmH20  INTAKE / OUTPUT: I/O last 3 completed shifts: In: 8058.1 [I.V.:6308.1; IV Piggyback:1750] Out: 1255 [Urine:1255]  PHYSICAL EXAMINATION: General: critically ill, intubated Neuro: gcs<8T HEENT:  Normocephalic and atraumatic, PERRLA, trachea midline, neck is short Cardiovascular: Rate and rhythm irregular, S1, S2 audible, no murmur or regurg Lungs:  intubated, bilateral airflow, positive rhonchi in anterior lung fields, no wheezing  Abdomen: Distended, no bowel sounds, tympanic on percussion, firm on palpation  Musculoskeletal:  No visible joint deformities, positive range of motion Extremities: +2 pulses bilaterally, +1 edema, mild venostasis discoloration  Skin:  Cyanotic extremities and lips, no rash or lesions  LABS:  BMET  Recent Labs Lab 02/27/16 0255 02/28/16 0236 03/14/16 0444  NA 132* 129* 124*  K 3.9 4.6 5.5*  CL 102 101 97*  CO2 25 20* 17*  BUN 26* 32* 43*  CREATININE 1.22* 1.44* 2.00*  GLUCOSE 194* 185* 229*    Electrolytes  Recent Labs Lab 02/25/16 0850 02/26/16 0414 02/27/16 0255 02/28/16 0236 03/10/2016 0444  CALCIUM 6.9* 7.0* 7.1* 7.4* 6.3*  MG 1.8 1.9  --  2.2 1.8  PHOS 3.5  --   --  6.2* 7.9*    CBC  Recent Labs Lab 02/27/16 0255 02/28/16 0236 02/27/2016 0444  WBC 6.2 8.9 15.3*  HGB 9.2* 11.0* 10.1*  HCT 27.0* 32.5* 31.7*  PLT 244 324 269     Coag's  Recent Labs Lab 02/27/16 1111 02/28/16 0236  APTT >160*  --   INR  --  1.29    Sepsis Markers  Recent Labs Lab 02-25-2016 1031 02/28/16 0226 02/28/16 0835 03/15/2016 0444  LATICACIDVEN 0.9 2.5* 2.9*  --   PROCALCITON  --  6.97  --  23.38    ABG  Recent Labs Lab 02/28/16 0400  PHART 7.28*  PCO2ART 38  PO2ART 235*    Liver Enzymes  Recent Labs Lab 02-25-16 1221 02/23/16 0515 02/26/16 0414  AST 12*  --   --   ALT 8*  --   --   ALKPHOS 49  --   --   BILITOT 0.8  --   --   ALBUMIN 1.5* 1.4* 1.2*    Cardiac Enzymes  Recent Labs Lab 02/28/16 0226 02/28/16 0835 02/28/16 1350  TROPONINI 0.05* <0.03 <0.03    Glucose  Recent Labs Lab 02/28/16 0720 02/28/16 1119 02/28/16 1626 02/28/16 1940 02/28/16 2343 03/04/2016 0345  GLUCAP 182* 222* 248* 207* 213* 193*    Imaging Dg Chest Port 1 View  Result Date: 03/09/2016 CLINICAL DATA:  Acute respiratory failure. EXAM: PORTABLE CHEST 1 VIEW COMPARISON:  02/28/2016. FINDINGS: ET tube 2.7 cm above carina. Central venous catheter tip proximal RIGHT atrium. Orogastric tube coiled in stomach. Cardiomegaly. Pulmonary edema, slightly improved. Increased lung volumes with decreased effusions and improved basilar opacity. IMPRESSION: Improved aeration.  Support tubes and lines remain stable. Electronically Signed   By: Elsie Stain M.D.   On: 03/21/2016 07:37    STUDIES:  None  CULTURES: 02/28/2016 Blood cultures 2. Sputum culture Urine culture  Ciprofloxacin 02-25-16 > Flagyl Feb 25, 2016 >  ANTIBIOTICS: 02/28/2016 Vancomycin. Zosyn.  SIGNIFICANT EVENTS: 01/23/2016: ED with hematemesis, hematochezia, A. fib with RVR, hence admitted. 02-25-16: OR for superior mesenteric artery embolization. 02/28/2016: Acute respiratory failure secondary to pulmonary edema and questionable pneumonia; intubated and transferred to the intensive care unit   LINES/TUBES: PICC line  peripheral IVs  ET tube  placed 02/28/2016   DISCUSSION: 80 year old Caucasian female admitted with acute GI bleed complicated by A. fib with RVR and superior mesenteric artery occlusion status post embolization. Now presenting with acute respiratory failure secondary to pulmonary edema Patient with multiorgan failure   ASSESSMENT / PLAN:  PULMONARY A: Acute hypoxic respiratory failure secondary to acute pulmonary edema. Bilateral pleural effusions versus infiltrate. Possible pneumonia-aspiration versus bacteria P:   Full vent support with current settings VAP protocol Nebulized bronchodilator  CARDIOVASCULAR A: Septic shock. A. fib with RVR Anasarca. History of hypertension P:  Hemodynamic monitoring per ICU protocol Norepinephrine infusion, titrate to maintain mean arterial blood pressure greater than or equal 65 Hold antihypertensives in light of hypotension and septic shock, IVF's given LA 2.9  RENAL A:   Acute renal failure- Hyponatremia Hyperphosphatemia -hyperkalemia P:   Trend creatinine Renally dose medications Monitor and correct electrolyte imbalances -will need to consider Nephrology consult  GASTROINTESTINAL A:   Superior mesenteric  artery thrombosis, status posts, thrombectomy and embolectomy Acute GI bleed-resolved Unresolving ileus/abdominal distention with questionable small bowel obstruction Ascites P:   Surgery GI and vascular surgery following NG tube to low continuous suction Continue heparin infusion Continue Protonix 40 mg twice a day -on TPN  HEMATOLOGIC A:   SMA thrombosis s/p embolectomy/thrombectomy P:  Heparin infusion per vascular surgery  INFECTIOUS A:   Bilateral infiltrates-high suspicion for pneumonia  Sepsis-GI versus respiratory source, most likely GI; Pro-calcitonin 6.97  P:   - ciprofloxacin and Flagyl - vancomycin and Zosyn. Trend pro-calcitonin Sputum, Blood and urine cultures pending  ENDOCRINE A:   History of type 2 diabetes  P:    Blood glucose monitoring with sliding  scale insulin coverage Hypoglycemia protocol when necessary   NEUROLOGIC A:   Acute hypoxic/metabolic encephalopathy  P:   RASS goal: -1 to -2 Titrate FiO2 based on ABG Fentanyl and Versed for sedation and comfort  I have personally obtained a history, examined the patient, evaluated Pertinent laboratory and RadioGraphic/imaging results, and  formulated the assessment and plan   The Patient requires high complexity decision making for assessment and support, frequent evaluation and titration of therapies, application of advanced monitoring technologies and extensive interpretation of multiple databases. Critical Care Time devoted to patient care services described in this note is 40 minutes.   Overall, patient is critically ill, prognosis is guarded.  Patient with Multiorgan failure and at high risk for cardiac arrest and death.   Very poor prognosis, recommend DNR status Will update family when available  Lucie Leather, M.D.  [facility]  Medical Director [facility] Medical Director [facility] Cardio-Pulmonary Department

## 2016-03-24 NOTE — Progress Notes (Signed)
After further discussion with family, I have explained that patient has had progressive multiorgan failure  Patient with very poor prognosis and very poor chance on meaningful recovery   Family has agreed and consented to DNR status and to withdraw care. They have expressed that she has suffered enough, they are waiting for family to [place]  Will proceed with comfort care measures whne family is ready. Patient DNR now.    Lucie LeatherKurian David Shatara Stanek, M.D.  [facility]  Medical Director [facility] Medical Director [facility] Cardio-Pulmonary Department

## 2016-03-24 NOTE — Progress Notes (Signed)
eLink Physician-Brief Progress Note Patient Name: [name] CarrelBobbie L Mahnke DOB: 06/03/1935 MRN: [id number]   Date of Service  03/22/2016  HPI/Events of Note  Notified by bedside nurse of rapid ventricular response with atrial fibrillation. Currently on amiodarone infusion and IV digoxin. Hypotensive.  eICU Interventions  Bolus amiodarone 150 mg by infusion & checking a.m. labs now      Intervention Category Major Interventions: Arrhythmia - evaluation and management  Lawanda CousinsJennings Alexcia Schools 03/05/2016, 4:44 AM

## 2016-03-24 NOTE — Progress Notes (Signed)
MD ordered no more insulin given to patient since awaiting family member arrival for extubation to comfort care. Team will continue to monitor.

## 2016-03-24 NOTE — Progress Notes (Signed)
Patient died at 13:15 03/07/2016. Heart rate rapidly decreased and RNs went into room to check on patient. Patient a DNR. Upon assessment patient had passed away. Family in patient's room and waiting room notified. Pronounced by Sande BrothersAmelia Berry RN and Adron BeneSarah Din Bookwalter RN. Dr. Belia HemanKasa notified. Scientist, research (medical)Cheryl RN Public affairs consultant(Administrative Coordinator) got funeral home information from family. [facility] notified (ref number [id number], spoke with Myrle ShengLatosha Jorie Zee), patient not a candidate for donation.

## 2016-03-24 NOTE — Progress Notes (Signed)
Patient K 5.5 and MG 1.8, NP aware. Had amiodarone bolus, HR remains in 120s. BP up this AM remains on Levo gtt.

## 2016-03-24 NOTE — Progress Notes (Signed)
Spoke to NP about persistent hypotension and the pt is on the Levo gtt. Ordered 500 ml NS bolus. HR tachy and sustaining.

## 2016-03-24 NOTE — Progress Notes (Signed)
MD spoke with RN about results of family discussion. Plan for patient to be DNR with no more escalation of care. Will plan for extubation to comfort care when [place]. MD discontinued sodium bicarbonate drip. RN updated MD about ability to come down on levophed, but patient not arousing. RN had been updating family on this as they arrived in room. Team will continue to monitor.

## 2016-03-24 NOTE — Progress Notes (Signed)
The Chaplain responded to ICU 14, patient passing. Chaplain provided a compassionate presence and emotional and spiritual support to and for the family.  Jefm PettyChaplain Keyosha Tiedt [phone]

## 2016-03-24 NOTE — Progress Notes (Signed)
Son, [name], spoBethann Berkshireke with RN about desire for patient to be comfortable rather than continue to seek medical treatment. RN clarified that son was discussing stopping IV infusions and removing breathing tube and de-escalating care. Son stated understanding of RNs definitions and that he wanted to de-escalate care. RN spoke with Dr. Belia HemanKasa about that update to situation. MD to speak with all family present out in waiting room shortly. RN also informed MD about IV compatibilty (lack thereof with amiodarone, levophed, sodium bicarbonate, and TPN, patient's only access triple lumen PICC, RN unable to get a peripheral IV due to edema, incompatibilities confirmed with pharmacist). MD ordered RN to hold sodium bicarbonate, will reassess medications if family does not choose comfort care. Also told RN to hold bowel medications and long acting insulin until certain of family decision. Team will continue to monitor.

## 2016-03-24 NOTE — Consult Note (Signed)
[facility]  CONSULT NOTE    Date: 03/05/16                  Patient Name:  [name]  MRN: [id number]  DOB: Jun 03, 1935  Age / Sex: 80 y.o., female         PCP: No PCP Per Patient                 Service Requesting Consult: Dr. Mortimer Fries                 Reason for Consult: Acute renal failure, metabolic acidosis, hyponatremia            History of Present Illness: [name] is a 80 y.o. white female with atrial fibrillation, hypertension, diabetes mellitus type II, hysterectomy, who was admitted to [facility] on 10/28/3708 for Metabolic acidosis [G26.9] Upper GI bleed [K92.2] Atrial fibrillation with rapid ventricular response ([place]) [I48.91] Acute respiratory failure with hypoxia ([place]) [J96.01] Nausea vomiting and diarrhea [R11.2, R19.7]   Patient has had a very complicated prolonged hospitalization. She was initially treated for small bowel obstruction. She underwent superior mesenteric artery thrombectomy on 10/1.   She went into cardiac arrest and code blue was called on 10/6. She is now on norepinephrine and has worsening renal failure, hyponatremia, metabolic acidosis, hyperkalemia, and septic shock. A diagnosis of ischemic bowel has been made.    Medications: Outpatient medications: No prescriptions prior to admission.    Current medications: Current Facility-Administered Medications  Medication Dose Route Frequency Provider Last Rate Last Dose  . 0.9 %  sodium chloride infusion   Intravenous Continuous Bettey Costa, MD 50 mL/hr at March 05, 2016 0450    . acetaminophen (TYLENOL) tablet 650 mg  650 mg Oral Q6H PRN Gladstone Lighter, MD      . amiodarone (NEXTERONE PREMIX) 360-4.14 MG/200ML-% (1.8 mg/mL) IV infusion  30 mg/hr Intravenous Continuous Theodoro Grist, MD 16.7 mL/hr at 2016-03-05 0626 30 mg/hr at 03-05-16 0626  . bisacodyl (DULCOLAX) suppository 10 mg  10 mg Rectal Daily Theodoro Grist, MD   10 mg at 02/28/16 1057  . chlorhexidine gluconate  (MEDLINE KIT) (PERIDEX) 0.12 % solution 15 mL  15 mL Mouth Rinse BID Mikael Spray, NP   15 mL at Mar 05, 2016 0821  . ciprofloxacin (CIPRO) IVPB 400 mg  400 mg Intravenous BID Jules Husbands, MD   400 mg at 02/28/16 2119  . digoxin (LANOXIN) 0.25 MG/ML injection 0.125 mg  0.125 mg Intravenous Q48H Theodoro Grist, MD   0.125 mg at 03-05-2016 0746  . digoxin (LANOXIN) 0.25 MG/ML injection 0.5 mg  0.5 mg Intravenous Once Bettey Costa, MD      . diltiazem (CARDIZEM) injection 10 mg  10 mg Intravenous Q4H PRN Saundra Shelling, MD   10 mg at 02/21/16 1833  . famotidine (PEPCID) IVPB 20 mg premix  20 mg Intravenous Q12H Mikael Spray, NP   20 mg at 02/28/16 2119  . fentaNYL (SUBLIMAZE) bolus via infusion 25 mcg  25 mcg Intravenous Q1H PRN Mikael Spray, NP   25 mcg at 03-05-16 0300  . fentaNYL 2553mg in NS 2584m(1046mml) infusion-PREMIX  25-400 mcg/hr Intravenous Continuous MagMikael SprayP   Stopped at 10/October 13, 201756  . heparin ADULT infusion 100 units/mL (25000 units/250m87mdium chloride 0.45%)  2,250 Units/hr Intravenous Continuous Sital Mody, MD 22.5 mL/hr at 10/02017/10/139 2,250 Units/hr at 10/010/13/179  . insulin aspart (novoLOG) injection 0-20 Units  0-20 Units  Subcutaneous Q4H Bettey Costa, MD   7 Units at March 19, 2016 0834  . insulin glargine (LANTUS) injection 30 Units  30 Units Subcutaneous Daily Bettey Costa, MD   30 Units at 02/28/16 1048  . iopamidol (ISOVUE-300) 61 % injection 30 mL  30 mL Oral Once PRN Theodoro Grist, MD      . MEDLINE mouth rinse  15 mL Mouth Rinse 10 times per day Mikael Spray, NP   15 mL at 19-Mar-2016 0601  . metoprolol (LOPRESSOR) injection 5 mg  5 mg Intravenous Q6H Bettey Costa, MD   Stopped at 03-19-2016 0600  . metroNIDAZOLE (FLAGYL) IVPB 500 mg  500 mg Intravenous Q8H Diego F Pabon, MD   500 mg at March 19, 2016 4765  . midazolam (VERSED) injection 1 mg  1 mg Intravenous Q15 min PRN Mikael Spray, NP      . midazolam (VERSED) injection 1 mg  1 mg Intravenous Q2H  PRN Mikael Spray, NP   1 mg at 2016/03/19 0142  . norepinephrine (LEVOPHED) 16 mg in dextrose 5 % 250 mL (0.064 mg/mL) infusion  0-65 mcg/min Intravenous Titrated Mikael Spray, NP 60 mL/hr at 03/19/2016 0846 64 mcg/min at 19-Mar-2016 0846  . ondansetron (ZOFRAN) tablet 4 mg  4 mg Oral Q6H PRN Lance Coon, MD   4 mg at 02/24/2016 1540   Or  . ondansetron [facility]) injection 4 mg  4 mg Intravenous Q6H PRN Lance Coon, MD   4 mg at 02/27/16 1622  . pantoprazole (PROTONIX) injection 40 mg  40 mg Intravenous Q12H Theodoro Grist, MD   40 mg at 02/28/16 2126  . phenol-menthol (CEPASTAT) lozenge 1 lozenge  1 lozenge Buccal PRN Theodoro Grist, MD   1 lozenge at 02/26/16 0035  . piperacillin-tazobactam (ZOSYN) IVPB 3.375 g  3.375 g Intravenous Q8H Sheema M Hallaji, RPH      . polyethylene glycol (MIRALAX / GLYCOLAX) packet 17 g  17 g Per Tube Daily Mikael Spray, NP   17 g at 02/28/16 1057  . sennosides (SENOKOT) 8.8 MG/5ML syrup 5 mL  5 mL Per Tube BID PRN Mikael Spray, NP      . sodium bicarbonate 150 mEq in sterile water 1,000 mL infusion   Intravenous Continuous Flora Lipps, MD      . sodium chloride flush (NS) 0.9 % injection 10-40 mL  10-40 mL Intracatheter Q12H Mikael Spray, NP   20 mL at 02/28/16 2124  . sodium chloride flush (NS) 0.9 % injection 10-40 mL  10-40 mL Intracatheter PRN Mikael Spray, NP   20 mL at 02/28/16 2123  . sodium chloride flush (NS) 0.9 % injection 3 mL  3 mL Intravenous Q12H Lance Coon, MD   3 mL at 02/27/16 1000  . TPN (CLINIMIX) Adult without lytes   Intravenous Continuous TPN Vira Blanco, RPH 85 mL/hr at 2016-03-19 4650    . vancomycin (VANCOCIN) IVPB 1000 mg/200 mL premix  1,000 mg Intravenous Q18H Mikael Spray, NP   1,000 mg at March 19, 2016 0802      Allergies: Allergies  Allergen Reactions  . Demerol [Meperidine] Nausea And Vomiting  . Reglan [Metoclopramide] Swelling  . Penicillins Swelling and Rash    Swelling to arm at injection  site. Has patient had a PCN reaction causing immediate rash, facial/tongue/throat swelling, SOB or lightheadedness with hypotension: Yes Has patient had a PCN reaction causing severe rash involving mucus membranes or skin necrosis: No Has patient had a PCN reaction that  required hospitalization No Has patient had a PCN reaction occurring within the last 10 years: No If all of the above answers are "NO", then may proceed with Cephalosporin use.       Past Medical History: Past Medical History:  Diagnosis Date  . Atrial fibrillation ([place])   . Diabetes mellitus without complication ([place])   . Hypertension      Past Surgical History: Past Surgical History:  Procedure Laterality Date  . ABDOMINAL HYSTERECTOMY    . PERIPHERAL VASCULAR CATHETERIZATION N/A 02/28/2016   Procedure: Abdominal Aortogram;  Surgeon: Katha Cabal, MD;  Location: [place] [facility];  Service: Cardiovascular;  Laterality: N/A;     Family History: Family History  Problem Relation Age of Onset  . Diabetes Mother   . Kidney failure Mother   . Diabetes Father   . Kidney failure Father   . Stroke Father      Social History: Social History   Social History  . Marital status: Divorced    Spouse name: N/A  . Number of children: N/A  . Years of education: N/A   Occupational History  . Not on file.   Social History Main Topics  . Smoking status: Never Smoker  . Smokeless tobacco: Never Used  . Alcohol use No  . Drug use: No  . Sexual activity: Not on file   Other Topics Concern  . Not on file   Social History Narrative  . No narrative on file     Review of Systems: Review of Systems  Unable to perform ROS: Critical illness    Vital Signs: Blood pressure 93/63, pulse 63, temperature 98.8 F (37.1 C), temperature source Oral, resp. rate 19, height 5' 7"  (1.702 m), weight 118.6 kg (261 lb 7.5 oz), SpO2 99 %.  Weight trends: Filed Weights   02/28/16 0500 02/28/16 1936 2016-03-22 0530   Weight: 111.7 kg (246 lb 4.1 oz) 120.8 kg (266 lb 6.8 oz) 118.6 kg (261 lb 7.5 oz)    Physical Exam: General: Critically ill  Head: +ETT, +OGT  Eyes: Eyes closed  Neck: Supple, trachea midline  Lungs:  PRVC FiO2 35%  Heart: Regular rate and rhythm  Abdomen:  Distended, obese  Extremities: 3+ peripheral edema.  Neurologic: Intubated, sedated  Skin: No lesions  Access: none     Lab results: Basic Metabolic Panel:  Recent Labs Lab 02/24/16 0420 02/25/16 0850  02/26/16 0414 02/27/16 0255 02/28/16 0236 2016-03-22 0444  NA 137 135  --  134* 132* 129* 124*  K 3.5 3.3*  < > 3.7 3.9 4.6 5.5*  CL 105 104  --  105 102 101 97*  CO2 27 26  --  24 25 20* 17*  GLUCOSE 300* 223*  --  192* 194* 185* 229*  BUN 23* 24*  --  26* 26* 32* 43*  CREATININE 0.97 1.00  --  1.03* 1.22* 1.44* 2.00*  CALCIUM 6.6* 6.9*  --  7.0* 7.1* 7.4* 6.3*  MG 1.8 1.8  --  1.9  --  2.2 1.8  PHOS 3.1 3.5  --   --   --  6.2* 7.9*  < > = values in this interval not displayed.  Liver Function Tests:  Recent Labs Lab 03/06/2016 1221 02/23/16 0515 02/26/16 0414  AST 12*  --   --   ALT 8*  --   --   ALKPHOS 49  --   --   BILITOT 0.8  --   --   PROT 5.3*  --   --  ALBUMIN 1.5* 1.4* 1.2*   No results for input(s): LIPASE, AMYLASE in the last 168 hours. No results for input(s): AMMONIA in the last 168 hours.  CBC:  Recent Labs Lab 02/25/16 0352  02/26/16 0414 02/27/16 0255 02/28/16 0236 03-04-2016 0444  WBC 6.4  --  6.3 6.2 8.9 15.3*  HGB 9.4*  < > 9.4* 9.2* 11.0* 10.1*  HCT 30.6*  --  28.0* 27.0* 32.5* 31.7*  MCV 117.3*  --  90.4 90.2 91.2 92.4  PLT 191  --  236 244 324 269  < > = values in this interval not displayed.  Cardiac Enzymes:  Recent Labs Lab 02/28/16 0226 02/28/16 0835 02/28/16 1350  TROPONINI 0.05* <0.03 <0.03    BNP: Invalid input(s): POCBNP  CBG:  Recent Labs Lab 02/28/16 1626 02/28/16 1940 02/28/16 2343 03/04/16 0345 2016/03/04 0800  GLUCAP 248* 207* 213* 29*  87*    Microbiology: Results for orders placed or performed during the hospital encounter of 02/15/2016  Blood Culture (routine x 2)     Status: None   Collection Time: 01/29/2016  6:45 PM  Result Value Ref Range Status   Specimen Description BLOOD LEFT HAND  Final   Special Requests   Final    BOTTLES DRAWN AEROBIC AND ANAEROBIC  AER 1CC ANA3CC   Culture NO GROWTH 5 DAYS  Final   Report Status 02/17/2016 FINAL  Final  Blood Culture (routine x 2)     Status: None   Collection Time: 02/18/2016  7:21 PM  Result Value Ref Range Status   Specimen Description BLOOD  RIGHT HAND  Final   Special Requests   Final    BOTTLES DRAWN AEROBIC AND ANAEROBIC  AER2CC ANA 3CC   Culture NO GROWTH 5 DAYS  Final   Report Status 02/17/2016 FINAL  Final  MRSA PCR Screening     Status: None   Collection Time: 02/13/16 12:08 AM  Result Value Ref Range Status   MRSA by PCR NEGATIVE NEGATIVE Final    Comment:        The GeneXpert MRSA Assay (FDA approved for NASAL specimens only), is one component of a comprehensive MRSA colonization surveillance program. It is not intended to diagnose MRSA infection nor to guide or monitor treatment for MRSA infections.   CULTURE, BLOOD (ROUTINE X 2) w Reflex to ID Panel     Status: None   Collection Time: 02/14/16  7:06 PM  Result Value Ref Range Status   Specimen Description BLOOD RIGHT ANTECUBITAL  Final   Special Requests BOTTLES DRAWN AEROBIC AND ANAEROBIC 5CC  Final   Culture NO GROWTH 5 DAYS  Final   Report Status 02/19/2016 FINAL  Final  CULTURE, BLOOD (ROUTINE X 2) w Reflex to ID Panel     Status: None   Collection Time: 02/14/16  7:06 PM  Result Value Ref Range Status   Specimen Description BLOOD RIGHT FOREARM  Final   Special Requests BOTTLES DRAWN AEROBIC AND ANAEROBIC 5CC  Final   Culture NO GROWTH 5 DAYS  Final   Report Status 02/19/2016 FINAL  Final  MRSA PCR Screening     Status: Abnormal   Collection Time: 02/28/16  2:10 AM  Result Value Ref  Range Status   MRSA by PCR (A) NEGATIVE Final    INVALID, UNABLE TO DETERMINE THE PRESENCE OF TARGET DNA DUE TO SPECIMEN INTEGRITY. RECOLLECTION REQUESTED.    Comment: C/BRITTON LEE RUST [place] AT 0529 02/28/16.RWW/PMH        The  GeneXpert MRSA Assay (FDA approved for NASAL specimens only), is one component of a comprehensive MRSA colonization surveillance program. It is not intended to diagnose MRSA infection nor to guide or monitor treatment for MRSA infections.   Culture, blood (Routine X 2) w Reflex to ID Panel     Status: None (Preliminary result)   Collection Time: 02/28/16  2:36 AM  Result Value Ref Range Status   Specimen Description BLOOD LEFT HAND  Final   Special Requests BOTTLES DRAWN AEROBIC AND ANAEROBIC 5CC  Final   Culture NO GROWTH 1 DAY  Final   Report Status PENDING  Incomplete  Culture, blood (Routine X 2) w Reflex to ID Panel     Status: None (Preliminary result)   Collection Time: 02/28/16  2:45 AM  Result Value Ref Range Status   Specimen Description BLOOD RIGHT HAND  Final   Special Requests BOTTLES DRAWN AEROBIC AND ANAEROBIC 5CC  Final   Culture NO GROWTH 1 DAY  Final   Report Status PENDING  Incomplete  Urine culture     Status: Abnormal   Collection Time: 02/28/16  3:40 AM  Result Value Ref Range Status   Specimen Description URINE, RANDOM  Final   Special Requests NONE  Final   Culture >=100,000 COLONIES/mL YEAST (A)  Final   Report Status 03/06/16 FINAL  Final  Culture, expectorated sputum-assessment     Status: None   Collection Time: 02/28/16  3:54 AM  Result Value Ref Range Status   Specimen Description TRACHEAL ASPIRATE  Final   Special Requests NONE  Final   Sputum evaluation THIS SPECIMEN IS ACCEPTABLE FOR SPUTUM CULTURE  Final   Report Status 02/28/2016 FINAL  Final  Culture, respiratory (NON-Expectorated)     Status: None (Preliminary result)   Collection Time: 02/28/16  3:54 AM  Result Value Ref Range Status   Specimen Description  TRACHEAL ASPIRATE  Final   Special Requests NONE Reflexed from [id number]  Final   Gram Stain   Final    RARE WBC PRESENT,BOTH PMN AND MONONUCLEAR RARE GRAM VARIABLE ROD Performed at [facility]    Culture PENDING  Incomplete   Report Status PENDING  Incomplete  MRSA PCR Screening     Status: Abnormal   Collection Time: 02/28/16  5:30 AM  Result Value Ref Range Status   MRSA by PCR (A) NEGATIVE Final    INVALID, UNABLE TO DETERMINE THE PRESENCE OF TARGET DNA DUE TO SPECIMEN INTEGRITY. RECOLLECTION REQUESTED.    Comment: CALLED SARAH HEATH ON 02/28/16 AT 0847 BY [facility]  MRSA PCR Screening     Status: None   Collection Time: 02/28/16  9:18 AM  Result Value Ref Range Status   MRSA by PCR NEGATIVE NEGATIVE Final    Comment:        The GeneXpert MRSA Assay (FDA approved for NASAL specimens only), is one component of a comprehensive MRSA colonization surveillance program. It is not intended to diagnose MRSA infection nor to guide or monitor treatment for MRSA infections.     Coagulation Studies:  Recent Labs  02/28/16 0236  LABPROT 16.2*  INR 1.29    Urinalysis: No results for input(s): COLORURINE, LABSPEC, PHURINE, GLUCOSEU, HGBUR, BILIRUBINUR, KETONESUR, PROTEINUR, UROBILINOGEN, NITRITE, LEUKOCYTESUR in the last 72 hours.  Invalid input(s): APPERANCEUR    Imaging: Dg Abd 1 View  Result Date: 02/28/2016 CLINICAL DATA:  Acute onset of generalized abdominal distention. Initial encounter. EXAM: ABDOMEN - 1 VIEW COMPARISON:  CT of the abdomen and  pelvis performed 02/27/2016, and abdominal radiograph performed 02/26/2016 FINDINGS: There is persistent dilatation of small-bowel loops up to 7.2 cm in diameter. No free intra-abdominal air is identified, though evaluation for free air is limited on a single supine view. An enteric tube is noted ending overlying the body of the stomach. The visualized osseous structures are within normal limits; the sacroiliac joints are unremarkable  in appearance. Small bilateral pleural effusions are noted. External pacing pads are seen. Contrast is noted within the bladder. IMPRESSION: 1. Persistent dilatation of small-bowel loops up to 7.2 cm in diameter, concerning for high-grade small bowel obstruction. No free intra-abdominal air seen. 2. Enteric tube noted ending overlying the body of the stomach. 3. Small bilateral pleural effusions noted. Electronically Signed   By: Garald Balding M.D.   On: 02/28/2016 02:45   Ct Abdomen Pelvis W Contrast  Result Date: 02/27/2016 CLINICAL DATA:  Persistent and progressive abdominal pain. 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 EXAM: CT ABDOMEN AND PELVIS WITH CONTRAST TECHNIQUE: Multidetector CT imaging of the abdomen and pelvis was performed using the standard protocol following bolus administration of intravenous contrast. Oral contrast was also administered. CONTRAST:  68m ISOVUE-300 IOPAMIDOL (ISOVUE-300) INJECTION 61% COMPARISON:  February 22, 2016 FINDINGS: Lower chest: There are persistent pleural effusions with bibasilar consolidation. Pericardium is not appreciably thickened. There is a hiatal hernia. Hepatobiliary: No focal liver lesions are appreciable. There is sludge in the gallbladder. There also appears to be vicarious excretion of intravenous contrast into the gallbladder. The gallbladder wall does not appear thickened. There is no biliary duct dilatation. Pancreas: There is no pancreatic mass or inflammatory focus. Spleen: No splenic lesions evident. 1 cm calcified splenic artery aneurysm is stable, not felt to have significance in this age group. Adrenals/Urinary Tract: Adrenals appear unremarkable bilaterally. There is fairly extensive renal sinus fat bilaterally. There is no renal mass or hydronephrosis on either side. No renal or ureteral calculus evident. Urinary bladder is midline with wall thickness within normal limits. Stomach/Bowel: There remains dilated with a relative  transition zone in the right mid abdomen. This appearance is similar to 5 days prior. There remains inflammatory change in the ascending colon with mesenteric thickening. There are multiple diverticula in the ascending colon region, essentially stable. There remains decompression of more distal loops of colon. No free air or portal venous air is evident. Nasogastric tube extends into the stomach. There remains mesenteric thickening surrounding loops of dilated small bowel box, similar to recent study. There is no evident bowel pneumatosis. Vascular/Lymphatic: There is atherosclerotic calcification in the aorta and common iliac arteries. The celiac and superior mesenteric arteries appear patent proximally. There is a questionable thrombus in the superior mesenteric artery several cm from its origin, best appreciated on axial slices 37 and 38 series 2. No abdominal aortic aneurysm evident. No adenopathy evident. Reproductive: Uterus absent.  No pelvic mass. Other: There is moderate loculated ascites in the abdomen, overall increased. There is extensive anasarca which appears slightly increased overall, particularly in the lateral abdominal wall regions. Appendix region appears normal. No abscess evident. Musculoskeletal: There is degenerative change in the thoracic spine. There is stable anterior wedging of the T12 vertebral body. No blastic or lytic bone lesions are evident. There is no intramuscular lesion. IMPRESSION: Dilated inflamed small bowel loops appear similar to recent study. There is slightly more loculated ascites and slightly more anasarca, particularly mid lateral wall regions compared to most recent study. Transition zone in the mid ileum remains. Question a  degree of mechanical small bowel obstruction versus ischemia from apparent focus of superior mesenteric artery thrombus. Note that there is no bowel pneumatosis. No portal venous air. The degree of narrowing in the mid ileal region in the right mid  abdomen is stable. Inflammation involving the ascending colon remains, unchanged. No free air. Persistent pleural effusions with bibasilar airspace consolidation. Nasogastric tube tip and side port in stomach. Small hiatal hernia present. No renal or ureteral calculi.  No hydronephrosis. Aortoiliac atherosclerosis. Small calcified splenic artery aneurysm, not felt to be clinically significant in this age group. Question sludge in gallbladder. There is vicarious excretion of contrast into the gallbladder as well. Electronically Signed   By: Lowella Grip III M.D.   On: 02/27/2016 17:47   Dg Chest Port 1 View  Result Date: Mar 28, 2016 CLINICAL DATA:  Acute respiratory failure. EXAM: PORTABLE CHEST 1 VIEW COMPARISON:  02/28/2016. FINDINGS: ET tube 2.7 cm above carina. Central venous catheter tip proximal RIGHT atrium. Orogastric tube coiled in stomach. Cardiomegaly. Pulmonary edema, slightly improved. Increased lung volumes with decreased effusions and improved basilar opacity. IMPRESSION: Improved aeration.  Support tubes and lines remain stable. Electronically Signed   By: Staci Righter M.D.   On: March 28, 2016 07:37   Portable Chest Xray  Result Date: 02/28/2016 CLINICAL DATA:  Endotracheal tube placement.  Abdominal distention. EXAM: PORTABLE CHEST 1 VIEW COMPARISON:  02/28/2016 FINDINGS: An endotracheal tube has been placed with tip measuring 2.9 cm above the carina. Left central venous catheter remains unchanged in position. An enteric tube was placed. Tip is off the field of view but below the left hemidiaphragm. Shallow inspiration. Cardiac enlargement. Vascular congestion with small pleural effusions bilaterally. Basilar atelectasis or edema. No pneumothorax. Calcification of the aorta. IMPRESSION: Appliances appear in satisfactory location. Cardiac enlargement with vascular congestion, bilateral pleural effusions, basilar infiltrates or atelectasis. Electronically Signed   By: Lucienne Capers M.D.    On: 02/28/2016 02:42   Dg Chest Port 1 View  Result Date: 02/28/2016 CLINICAL DATA:  Acute onset of worsening shortness of breath and cough. Initial encounter. EXAM: PORTABLE CHEST 1 VIEW COMPARISON:  Chest radiograph performed 02/23/2016 FINDINGS: The patient's enteric tube is noted extending below the diaphragm. A left subclavian line is noted ending overlying the right atrium. Vascular congestion is noted. Small bilateral pleural effusions are seen. Increased interstitial markings raise concern for pulmonary edema. No pneumothorax is identified. The cardiomediastinal silhouette is mildly enlarged. No acute osseous abnormalities are seen. IMPRESSION: Vascular congestion and mild cardiomegaly. Small bilateral pleural effusions seen. Increased interstitial markings raise concern for pulmonary edema. Electronically Signed   By: Garald Balding M.D.   On: 02/28/2016 01:11      Assessment & Plan: [name] is a 80 y.o. white female with atrial fibrillation, hypertension, diabetes mellitus type II, hysterectomy, who was admitted to [facility] on 02/21/2016.  Prolonged hospitalization for ischemic gut, small bowel obstruction, GI bleed and now with cardiopulmonary arrest.   1. Acute Renal Failure: oliguric renal failure 2. Metabolic acidosis 3. Hyperkalemia 4. Hyponatremia 5. Hypocalcemia  Plan Overall prognosis very poor. Underlying issue is a ischemic gut and if this is not able to be amended, then renal failure will continue to get worse.  Not a candidate for dialysis due to hemodynamic instability and underlying condition.   LOS: [place], [place] 2017-03-509:21 AM

## 2016-03-24 NOTE — Discharge Summary (Signed)
DEATH SUMMARY PULMONARY / CRITICAL CARE MEDICINE   Name: [name] MRN: [id number] DOB: 03/27/1936    ADMISSION DATE:  02/21/2016  DEATH DATE 03/07/2016  ADMIT DX: resp failure, acidosis Discharge DX: 1.resp failure 2.acidosis 3.renal failure 4.shock 5.SBO probable ischemic bowel   SUBJECTIVE and EVENTS 10/01 s/p emblolization 10/6 intubated 10/7 multiorgan failure, family updated 10/8 remains critically ill  Multiple family discussion held Patient with progressive multiorgan failure with worsening acidosis  After further discussion with family, I have explained that patient has had progressive multiorgan failure  Patient with very poor prognosis and very poor chance on meaningful recovery   Family has agreed and consented to DNR status and to withdraw care.    Patient died at 13:15 09/20/15. Heart rate rapidly decreased and RNs went into room to check on patient. Patient a DNR. Upon assessment patient had passed away. Family in patient's room and waiting room notified.  patient not a candidate for donation.     Lucie LeatherKurian David Camyah Pultz, M.D.  [facility]  Medical Director [facility] Medical Director [facility]

## 2016-03-24 NOTE — Progress Notes (Signed)
RN notified MD about heart rate sustaining 130s with spikes up to 150s (afib rhythm). MD did not want to give any other medications to reduce heart rate due to blood pressure concerns, plan to continue amiodarone drip and scheduled digoxin IV. ABG results back and reviewed in person with MD and RT. MD ordered sodium bicarbonate infusion to help correct acidosis. Team will continue to monitor. Daughter at bedside updated by RN about plan of care. MD, Dr. Belia HemanKasa, had spoken with both sons earlier in shift already.

## 2016-03-24 DEATH — deceased

## 2016-04-15 MED ORDER — DEXTROSE 50 % IV SOLN
INTRAVENOUS | Status: AC
  Filled 2016-04-15: qty 50

## 2017-06-04 IMAGING — DX DG CHEST 1V PORT
1 series · 1 of 1 positions shown · non-contrast
Comparison: 02/28/2016

CLINICAL DATA: Endotracheal tube placement.  Abdominal distention.

EXAM:
PORTABLE CHEST 1 VIEW

[chest ap]
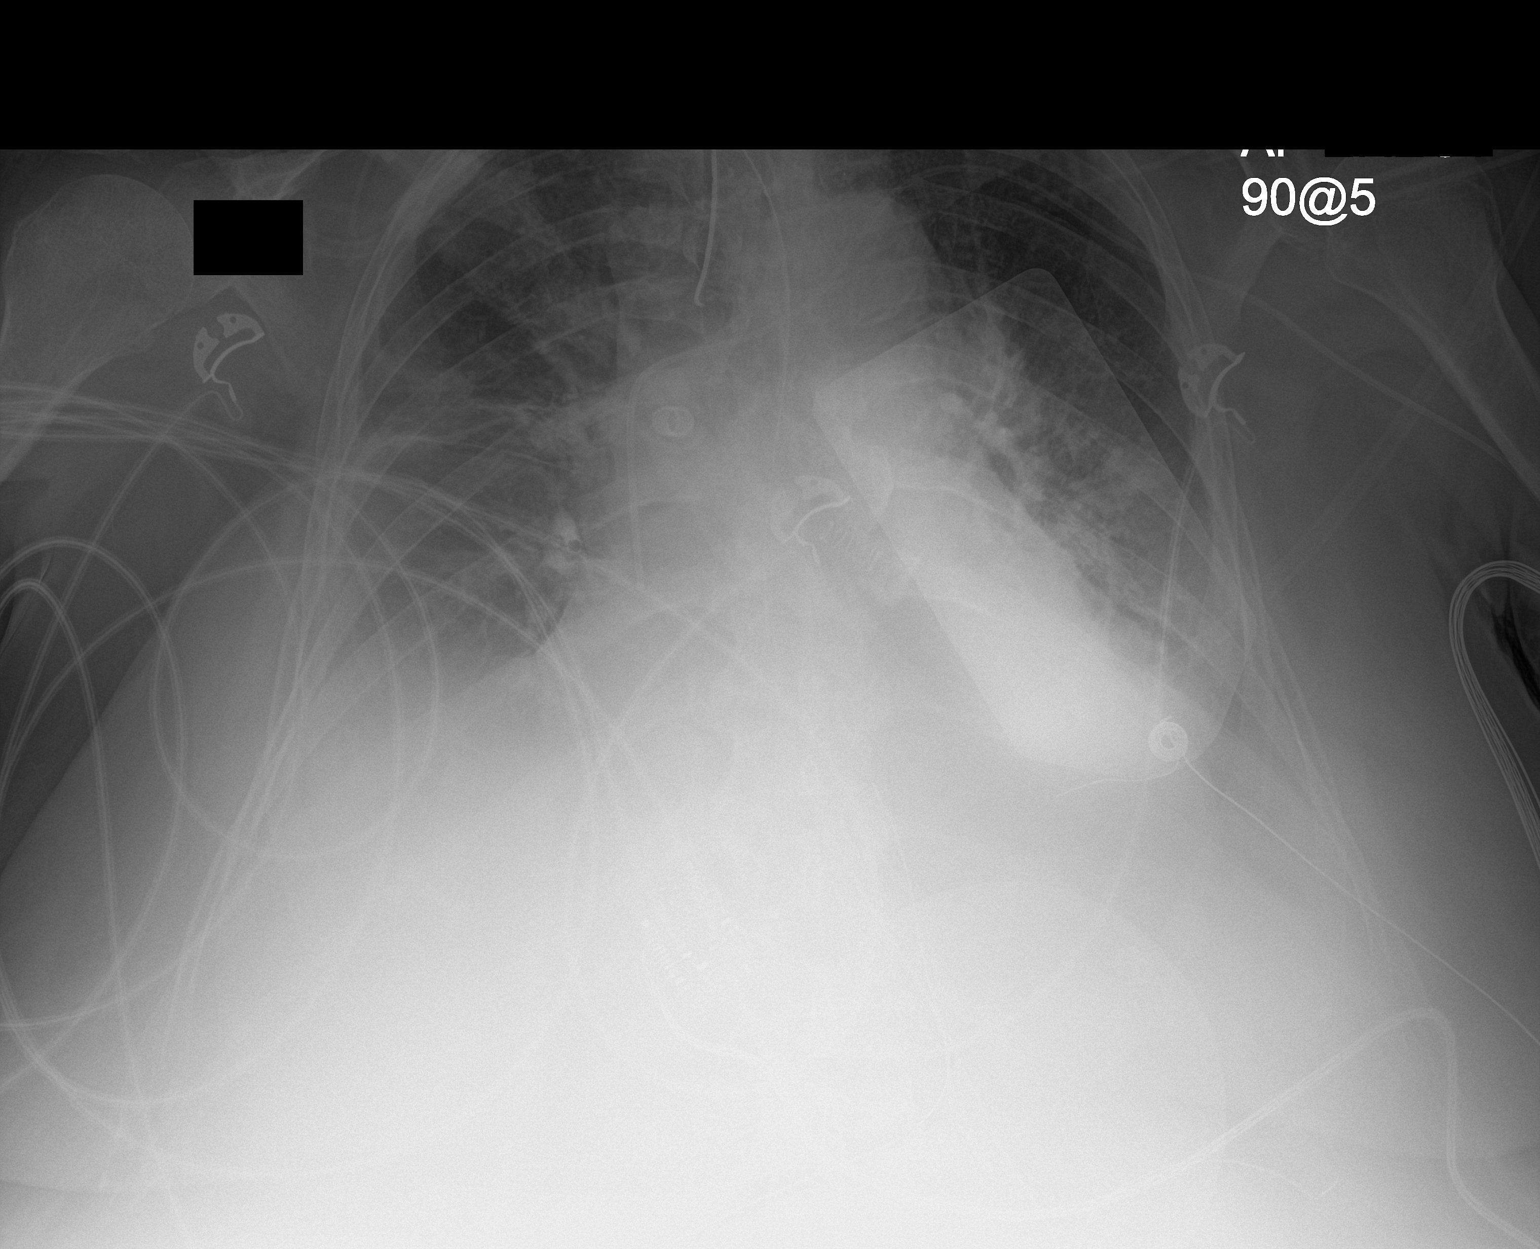

[1 of 1 positions shown; findings below may reference images not displayed]

FINDINGS: An endotracheal tube has been placed with tip measuring 2.9 cm above
the carina. Left central venous catheter remains unchanged in
position. An enteric tube was placed. Tip is off the field of view
but below the left hemidiaphragm. Shallow inspiration. Cardiac
enlargement. Vascular congestion with small pleural effusions
bilaterally. Basilar atelectasis or edema. No pneumothorax.
Calcification of the aorta.
IMPRESSION: Appliances appear in satisfactory location. Cardiac enlargement with
vascular congestion, bilateral pleural effusions, basilar
infiltrates or atelectasis.

## 2017-06-04 IMAGING — DX DG ABDOMEN 1V
2 series · 2 of 2 positions shown · non-contrast
Comparison: CT of the abdomen and pelvis performed 02/27/2016, and
abdominal radiograph performed 02/26/2016

CLINICAL DATA: Acute onset of generalized abdominal distention.
Initial encounter.

EXAM:
ABDOMEN - 1 VIEW

[abdomen kub (1 of 2)]
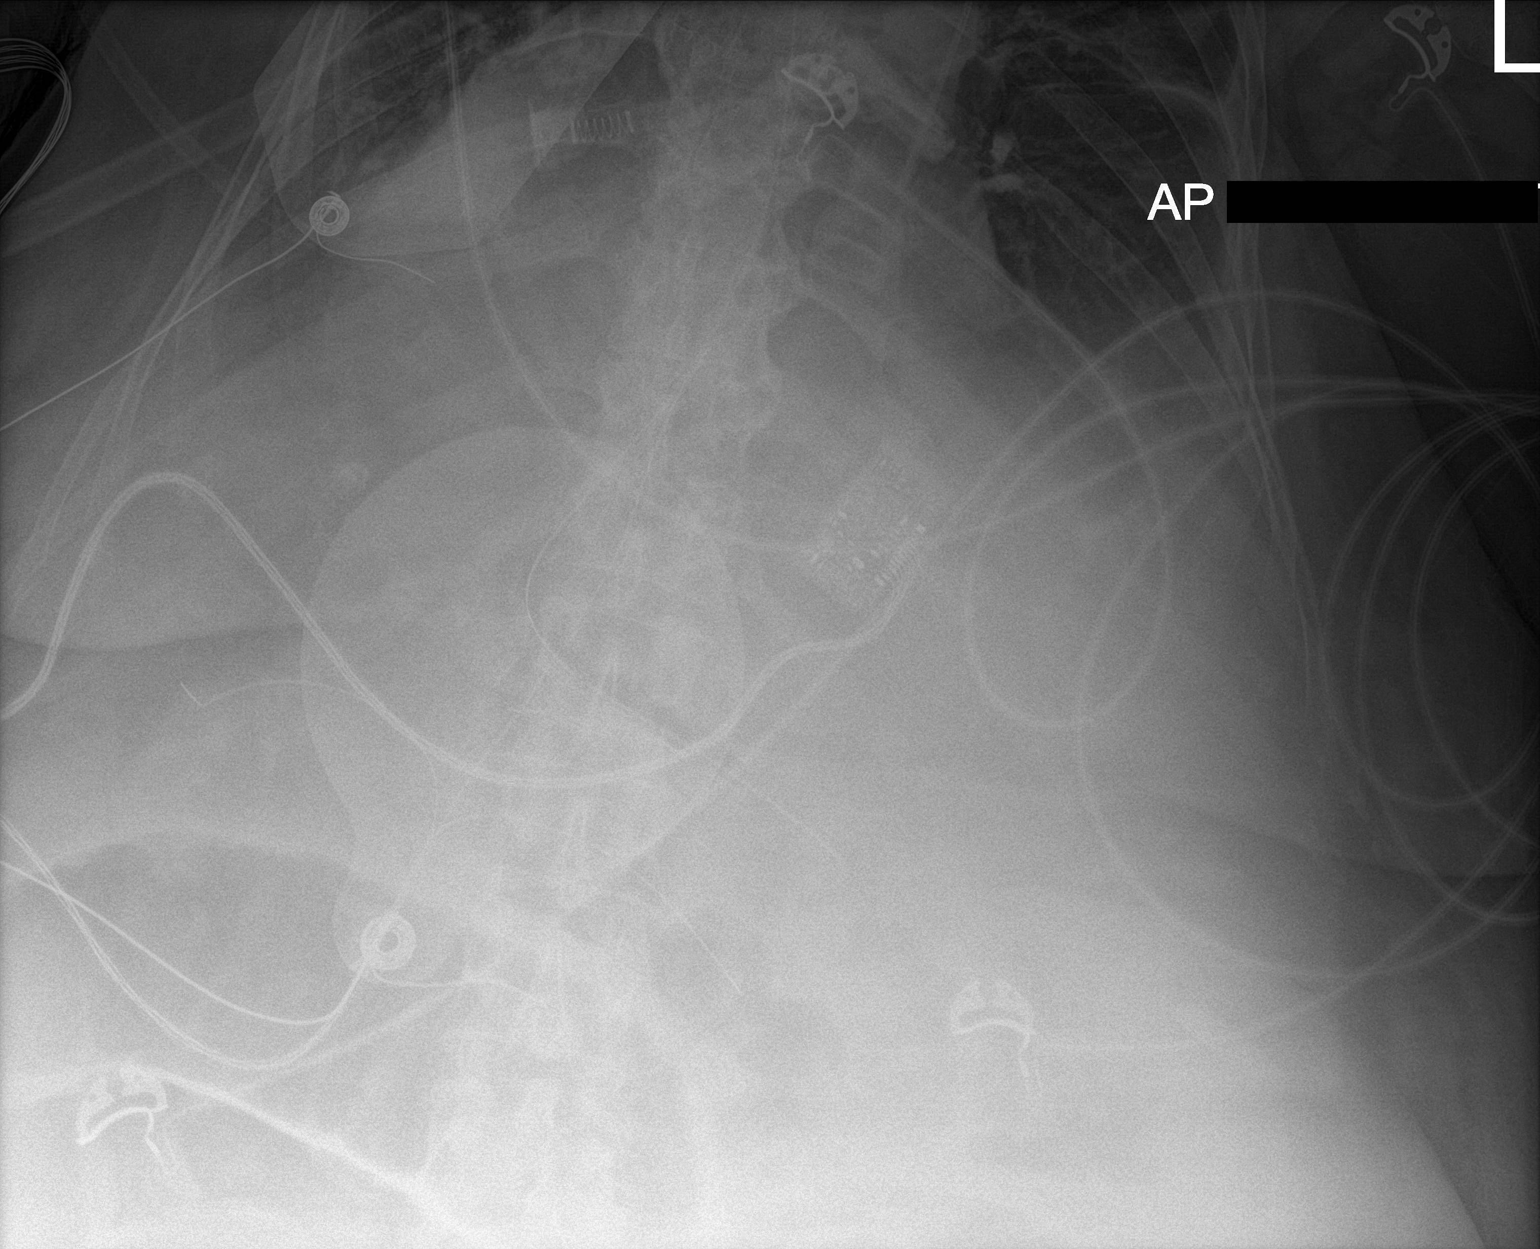

[abdomen kub (2 of 2)]
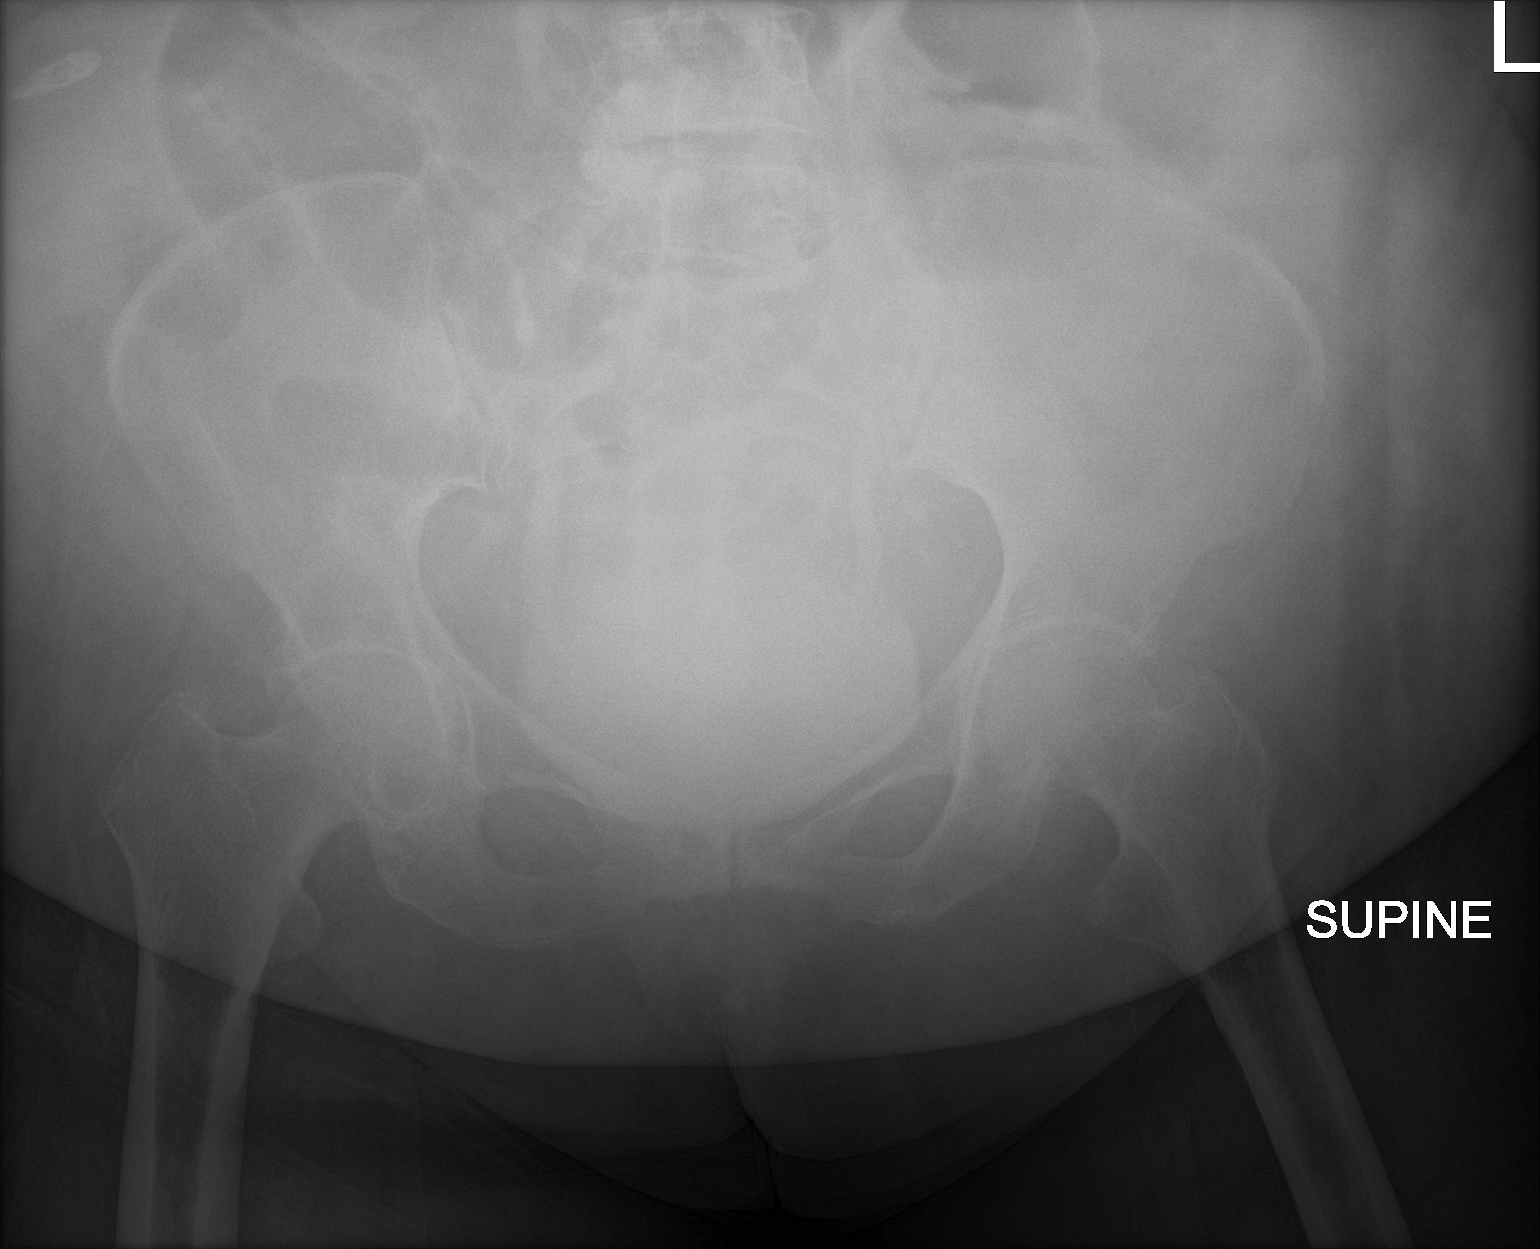

[2 of 2 positions shown; findings below may reference images not displayed]

FINDINGS: There is persistent dilatation of small-bowel loops up to 7.2 cm in
diameter. No free intra-abdominal air is identified, though
evaluation for free air is limited on a single supine view.

An enteric tube is noted ending overlying the body of the stomach.

The visualized osseous structures are within normal limits; the
sacroiliac joints are unremarkable in appearance. Small bilateral
pleural effusions are noted. External pacing pads are seen. Contrast
is noted within the bladder.
IMPRESSION: 1. Persistent dilatation of small-bowel loops up to 7.2 cm in
diameter, concerning for high-grade small bowel obstruction. No free
intra-abdominal air seen.
2. Enteric tube noted ending overlying the body of the stomach.
3. Small bilateral pleural effusions noted.

## 2017-06-04 IMAGING — DX DG CHEST 1V PORT
1 series · 1 of 1 positions shown · non-contrast
Comparison: Chest radiograph performed 02/23/2016

CLINICAL DATA: Acute onset of worsening shortness of breath and
cough. Initial encounter.

EXAM:
PORTABLE CHEST 1 VIEW

[chest ap]
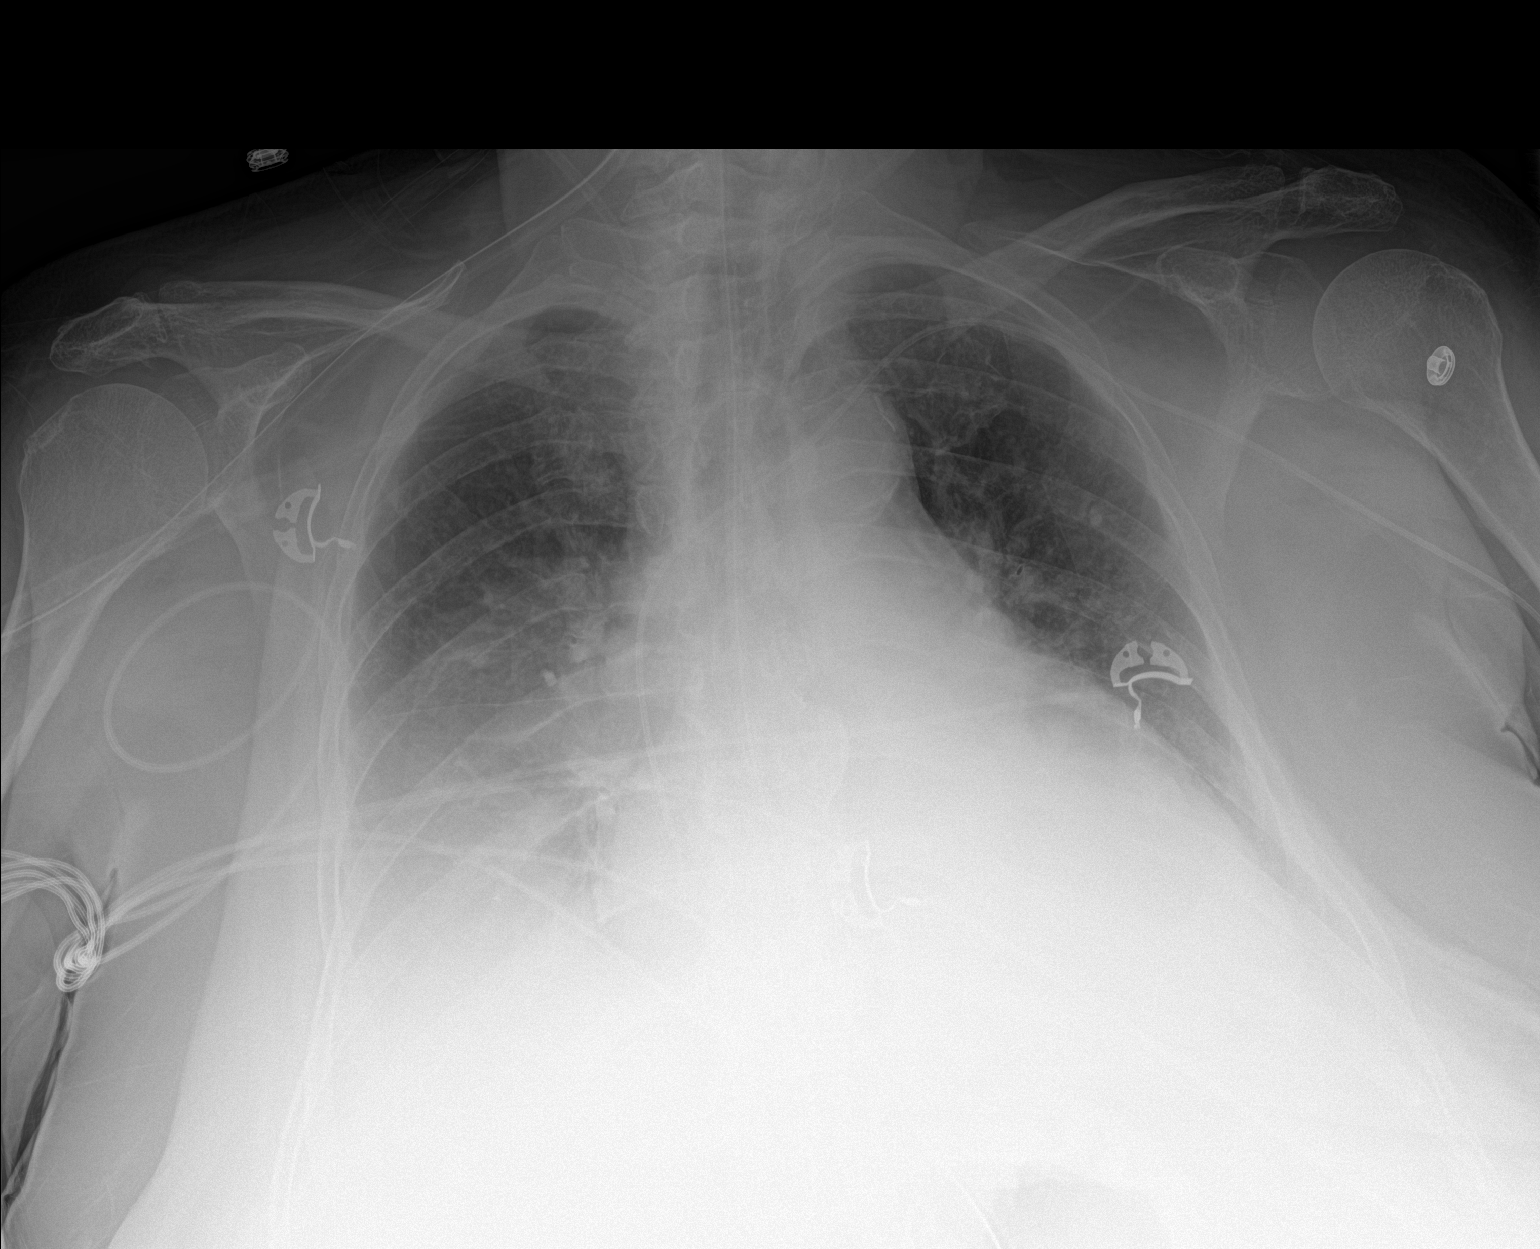

[1 of 1 positions shown; findings below may reference images not displayed]

FINDINGS: The patient's enteric tube is noted extending below the diaphragm. A
left subclavian line is noted ending overlying the right atrium.

Vascular congestion is noted. Small bilateral pleural effusions are
seen. Increased interstitial markings raise concern for pulmonary
edema. No pneumothorax is identified.

The cardiomediastinal silhouette is mildly enlarged. No acute
osseous abnormalities are seen.
IMPRESSION: Vascular congestion and mild cardiomegaly. Small bilateral pleural
effusions seen. Increased interstitial markings raise concern for
pulmonary edema.

## 2017-06-05 IMAGING — DX DG CHEST 1V PORT
1 series · 1 of 1 positions shown · non-contrast
Comparison: 02/28/2016.

CLINICAL DATA: Acute respiratory failure.

EXAM:
PORTABLE CHEST 1 VIEW

[chest ap]
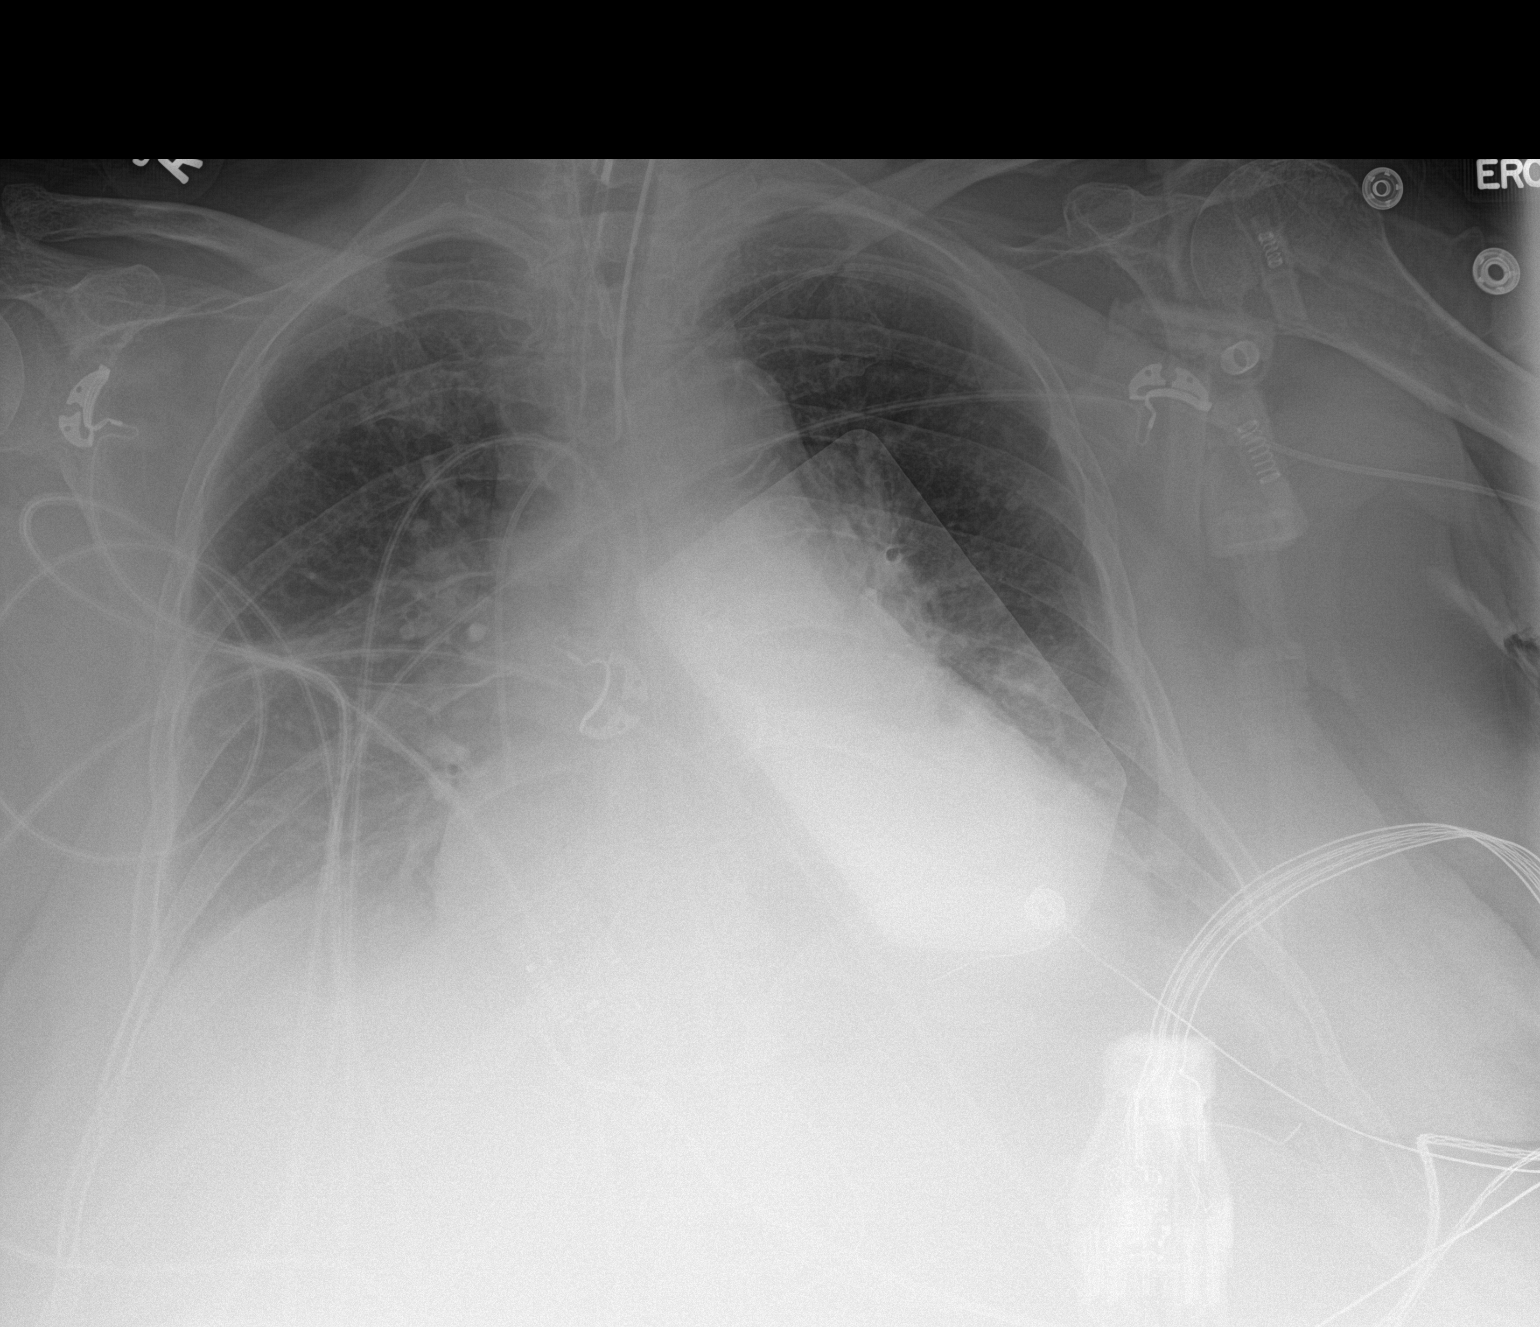

[1 of 1 positions shown; findings below may reference images not displayed]

FINDINGS: ET tube 2.7 cm above carina. Central venous catheter tip proximal
RIGHT atrium. Orogastric tube coiled in stomach. Cardiomegaly.
Pulmonary edema, slightly improved. Increased lung volumes with
decreased effusions and improved basilar opacity.
IMPRESSION: Improved aeration.  Support tubes and lines remain stable.
